# Patient Record
Sex: Female | Born: 1950 | Race: White | Hispanic: No | Smoking: Former smoker
Health system: Southern US, Community
[De-identification: ages and names within clinical notes are randomized; demographics above are authoritative.]

## PROBLEM LIST (undated history)

## (undated) DIAGNOSIS — F32A Depression, unspecified: Secondary | ICD-10-CM

## (undated) DIAGNOSIS — M549 Dorsalgia, unspecified: Secondary | ICD-10-CM

## (undated) DIAGNOSIS — I10 Essential (primary) hypertension: Secondary | ICD-10-CM

## (undated) DIAGNOSIS — E119 Type 2 diabetes mellitus without complications: Secondary | ICD-10-CM

## (undated) DIAGNOSIS — K219 Gastro-esophageal reflux disease without esophagitis: Secondary | ICD-10-CM

## (undated) DIAGNOSIS — F329 Major depressive disorder, single episode, unspecified: Secondary | ICD-10-CM

## (undated) DIAGNOSIS — F419 Anxiety disorder, unspecified: Secondary | ICD-10-CM

## (undated) DIAGNOSIS — G8929 Other chronic pain: Secondary | ICD-10-CM

## (undated) DIAGNOSIS — E785 Hyperlipidemia, unspecified: Secondary | ICD-10-CM

## (undated) DIAGNOSIS — M1990 Unspecified osteoarthritis, unspecified site: Secondary | ICD-10-CM

## (undated) DIAGNOSIS — I493 Ventricular premature depolarization: Secondary | ICD-10-CM

## (undated) DIAGNOSIS — R413 Other amnesia: Secondary | ICD-10-CM

## (undated) DIAGNOSIS — R55 Syncope and collapse: Secondary | ICD-10-CM

## (undated) HISTORY — DX: Dorsalgia, unspecified: M54.9

## (undated) HISTORY — DX: Anxiety disorder, unspecified: F41.9

## (undated) HISTORY — DX: Depression, unspecified: F32.A

## (undated) HISTORY — PX: ABDOMINAL HYSTERECTOMY: SHX81

## (undated) HISTORY — DX: Unspecified osteoarthritis, unspecified site: M19.90

## (undated) HISTORY — DX: Other chronic pain: G89.29

## (undated) HISTORY — PX: CARDIAC CATHETERIZATION: SHX172

## (undated) HISTORY — PX: DILATION AND CURETTAGE OF UTERUS: SHX78

## (undated) HISTORY — PX: EYE SURGERY: SHX253

## (undated) HISTORY — DX: Major depressive disorder, single episode, unspecified: F32.9

---

## 2014-02-23 ENCOUNTER — Emergency Department (HOSPITAL_COMMUNITY)
Admission: EM | Admit: 2014-02-23 | Discharge: 2014-02-23 | Payer: Medicare Other | Attending: Emergency Medicine | Admitting: Emergency Medicine

## 2014-02-23 ENCOUNTER — Encounter (HOSPITAL_COMMUNITY): Payer: Self-pay | Admitting: Emergency Medicine

## 2014-02-23 DIAGNOSIS — Z87891 Personal history of nicotine dependence: Secondary | ICD-10-CM | POA: Diagnosis not present

## 2014-02-23 DIAGNOSIS — Y9389 Activity, other specified: Secondary | ICD-10-CM | POA: Diagnosis not present

## 2014-02-23 DIAGNOSIS — T25329A Burn of third degree of unspecified foot, initial encounter: Secondary | ICD-10-CM | POA: Insufficient documentation

## 2014-02-23 DIAGNOSIS — E119 Type 2 diabetes mellitus without complications: Secondary | ICD-10-CM | POA: Insufficient documentation

## 2014-02-23 DIAGNOSIS — I10 Essential (primary) hypertension: Secondary | ICD-10-CM | POA: Diagnosis not present

## 2014-02-23 DIAGNOSIS — Z23 Encounter for immunization: Secondary | ICD-10-CM | POA: Insufficient documentation

## 2014-02-23 DIAGNOSIS — X028XXA Other exposure to controlled fire in building or structure, initial encounter: Secondary | ICD-10-CM | POA: Insufficient documentation

## 2014-02-23 DIAGNOSIS — T25029A Burn of unspecified degree of unspecified foot, initial encounter: Secondary | ICD-10-CM | POA: Insufficient documentation

## 2014-02-23 DIAGNOSIS — Y92009 Unspecified place in unspecified non-institutional (private) residence as the place of occurrence of the external cause: Secondary | ICD-10-CM | POA: Diagnosis not present

## 2014-02-23 DIAGNOSIS — T25321A Burn of third degree of right foot, initial encounter: Secondary | ICD-10-CM

## 2014-02-23 HISTORY — DX: Essential (primary) hypertension: I10

## 2014-02-23 HISTORY — DX: Type 2 diabetes mellitus without complications: E11.9

## 2014-02-23 MED ORDER — SILVER SULFADIAZINE 1 % EX CREA
TOPICAL_CREAM | Freq: Once | CUTANEOUS | Status: DC
  Filled 2014-02-23: qty 85

## 2014-02-23 MED ORDER — TETANUS-DIPHTH-ACELL PERTUSSIS 5-2.5-18.5 LF-MCG/0.5 IM SUSP
0.5000 mL | Freq: Once | INTRAMUSCULAR | Status: AC
  Administered 2014-02-23: 0.5 mL via INTRAMUSCULAR
  Filled 2014-02-23: qty 0.5

## 2014-02-23 NOTE — ED Notes (Signed)
Dr. Jacubowitz at bedside 
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
## 2014-02-24 NOTE — ED Provider Notes (Signed)
Medical screening examination/treatment/procedure(s) were conducted as a shared visit with non-physician practitioner(s) and myself.  I personally evaluated the patient during the encounter.   EKG Interpretation None       Orlie Dakin, MD 02/24/14 0110

## 2016-03-28 ENCOUNTER — Encounter: Payer: Self-pay | Admitting: Internal Medicine

## 2016-04-12 ENCOUNTER — Ambulatory Visit: Payer: Self-pay | Admitting: Nurse Practitioner

## 2016-04-25 ENCOUNTER — Ambulatory Visit (INDEPENDENT_AMBULATORY_CARE_PROVIDER_SITE_OTHER): Payer: Medicare HMO | Admitting: Nurse Practitioner

## 2016-04-25 ENCOUNTER — Telehealth: Payer: Self-pay

## 2016-04-25 ENCOUNTER — Other Ambulatory Visit: Payer: Self-pay

## 2016-04-25 ENCOUNTER — Encounter: Payer: Self-pay | Admitting: Nurse Practitioner

## 2016-04-25 DIAGNOSIS — Z800 Family history of malignant neoplasm of digestive organs: Secondary | ICD-10-CM

## 2016-04-25 DIAGNOSIS — R195 Other fecal abnormalities: Secondary | ICD-10-CM | POA: Diagnosis not present

## 2016-04-25 MED ORDER — PEG 3350-KCL-NA BICARB-NACL 420 G PO SOLR: 4000.0000 mL | ORAL | 0 refills | Status: DC

## 2016-04-25 NOTE — Assessment & Plan Note (Signed)
Primary care notes heme positive stool in this patient who is high risk for colon cancer due to family history as noted above. She has never had a colonoscopy before. At this point she has not noted overt GI bleeding. She does have a history of hemorrhoids. Likely benign anorectal source, but given family history and heme positive stool we will absolutely proceed with colonoscopy as a diagnostic evaluation. We will schedule her date tentatively as she is on Coumadin to reach out to her primary care provider to get the okay to hold Coumadin 4 days prior to procedure. Return for follow-up based on postprocedure recommendations. Early asymptomatic from a GI standpoint otherwise.  Proceed with TCS on propofol/'MAC with Dr. Gala Romney in near future: the risks, benefits, and alternatives have been discussed with the patient in detail. The patient states understanding and desires to proceed.  The patient is on Coumadin, Neurontin, oxycodone, and Ambien. Due to polypharmacy we will proceed with the endoscopic evaluation on propofol/MAC. We'll recheck out to primary care for okay to hold Coumadin 4 days prior.

## 2016-04-25 NOTE — Patient Instructions (Signed)
1. Reach out to your primary care provider to get the okay to hold your Coumadin prior to your procedure. 2. We will schedule your colonoscopy for you. 3. We will call to confirm the scheduled date after we get the okay from your primary care to hold Coumadin. 4. Further recommendations to be based on results of your procedure. 5. Return for follow-up based on recommendations made after your procedure.

## 2016-04-25 NOTE — Telephone Encounter (Signed)
Called pt and informed of pre-op appt 05/08/16 at 1:45pm. Letter also mailed.

## 2016-04-25 NOTE — Progress Notes (Signed)
Referring Provider: [facility] Primary Care Physician:  [facility] Primary GI:  Dr. Gala Romney  Chief Complaint  Patient presents with  . Colonoscopy    HPI:   Alexandra Berger is a 65 y.o. female who presents On referral from primary care for positive heme card and no history of colonoscopy. She is currently on Coumadin with a history of cardiac catheterization in 2014 with stenting. Most of her care is obtained in [place] and cardiology is likely up there as well. No history of colonoscopy found in our system.  Today she states she's doing well overall. Denies abdominal pain, N/V, hematochezia, and melena. Did have heme+ stool cards. Has never had a colonoscopy before. Denies fever, chills, unintentional weight loss, sudden changes in bowel habits. She has had hemorrhoids in the past. Denies chest pain, dyspnea, dizziness, lightheadedness, syncope, near syncope. Denies any other upper or lower GI symptoms.  Last saw cardiology 4 years ago. PCP manages coumadin now. Her cardiac cath in 2014 was clean. She is on coumadin due to family history of CAD and MI.  Takes her oxycodone "pretty regularly."  Past Medical History:  Diagnosis Date  . Anxiety and depression   . Chronic back pain   . Diabetes mellitus without complication ([place])   . DJD (degenerative joint disease)   . Hypertension     Past Surgical History:  Procedure Laterality Date  . ABDOMINAL HYSTERECTOMY    . CARDIAC CATHETERIZATION     2014  . EYE SURGERY Left     Current Outpatient Prescriptions  Medication Sig Dispense Refill  . benazepril (LOTENSIN) 20 MG tablet Take 20 mg by mouth.    . citalopram (CELEXA) 20 MG tablet Take 30 mg by mouth.    . cyclobenzaprine (FLEXERIL) 5 MG tablet Take 5 mg by mouth.    . digoxin (LANOXIN) 0.125 MG tablet Take by mouth.    . gabapentin (NEURONTIN) 300 MG capsule Take 300 mg by mouth.    . Insulin Glargine (LANTUS  SOLOSTAR) 100 UNIT/ML Solostar Pen Inject 48 Units into the skin daily at 10 pm.     . metFORMIN (GLUCOPHAGE) 1000 MG tablet Take 1,000 mg by mouth 2 (two) times daily with a meal.    . oxyCODONE-acetaminophen (PERCOCET/ROXICET) 5-325 MG per tablet Take 1 tablet by mouth every 4 (four) hours as needed for severe pain.    . pravastatin (PRAVACHOL) 20 MG tablet Take 20 mg by mouth.    . warfarin (COUMADIN) 5 MG tablet Take 5 mg by mouth daily. 6 mg Monday- Friday 7 mg  Saturday and Sunday    . zolpidem (AMBIEN) 5 MG tablet Take 2.5 mg by mouth.    . silver sulfADIAZINE (SILVADENE) 1 % cream Apply 1 application topically daily.     No current facility-administered medications for this visit.     Allergies as of 04/25/2016  . (No Known Allergies)    Family History  Problem Relation Age of Onset  . CAD Mother   . CAD Father   . Heart attack Father   . Colon cancer Brother 77    Social History   Social History  . Marital status: Married    Spouse name: N/A  . Number of children: N/A  . Years of education: N/A   Social History Main Topics  . Smoking status: Former Smoker    Quit date: 02/24/2012  . Smokeless tobacco: Never Used  . Alcohol use No  .  Drug use: No  . Sexual activity: Not Asked   Other Topics Concern  . None   Social History Narrative  . None    Review of Systems: General: Negative for anorexia, weight loss, fever, chills, fatigue, weakness. ENT: Negative for hoarseness, difficulty swallowing. CV: Negative for chest pain, angina, palpitations, peripheral edema.  Respiratory: Negative for dyspnea at rest, cough, sputum, wheezing.  GI: See history of present illness. MS: Admits DJD with chronic pain.  Derm: Negative for rash or itching.  Endo: Negative for unusual weight change.  Heme: Negative for bruising or bleeding. Allergy: Negative for rash or hives.   Physical Exam: BP 137/81   Pulse 76   Temp 97.6 F (36.4 C) (Oral)   Ht '5\' 5"'$  (1.651 m)   Wt  226 lb 12.8 oz (102.9 kg)   BMI 37.74 kg/m  General:   Obese female. Alert and oriented. Pleasant and cooperative. Well-nourished and well-developed.  Head:  Normocephalic and atraumatic. Eyes:  Without icterus, sclera clear and conjunctiva pink.  Ears:  Normal auditory acuity. Cardiovascular:  S1, S2 present without murmurs appreciated. Extremities without clubbing or edema. Respiratory:  Clear to auscultation bilaterally. No wheezes, rales, or rhonchi. No distress.  Gastrointestinal:  +BS, obese but soft, non-tender and non-distended. No HSM noted. No guarding or rebound. No masses appreciated.  Rectal:  Deferred  Musculoskalatal:  Symmetrical without gross deformities. Neurologic:  Alert and oriented x4;  grossly normal neurologically. Psych:  Alert and cooperative. Normal mood and affect. Heme/Lymph/Immune: No excessive bruising noted.    04/25/2016 1:46 PM   Disclaimer: This note was dictated with voice recognition software. Similar sounding words can inadvertently be transcribed and may not be corrected upon review.

## 2016-04-25 NOTE — Assessment & Plan Note (Signed)
High risk of colon cancer due to family history of colon cancer. Her brother had colorectal cancer at age 65-50. He passed away at age 12. The patient is age 41 and is never had a colonoscopy before. She is significantly overdue and concerning due to heme positive stool. We will proceed with colonoscopy as noted below.

## 2016-04-26 ENCOUNTER — Telehealth: Payer: Self-pay

## 2016-04-26 NOTE — Progress Notes (Signed)
CC'ED TO PCP 

## 2016-04-26 NOTE — Telephone Encounter (Signed)
Kim from [facility] called again, they have pt scheduled to see Dr.Hunter on 05/02/16 at 9:45am to discuss her coumadin. They have informed the pt.   pts procedure with RMR is on 05/14/16.

## 2016-04-26 NOTE — Telephone Encounter (Signed)
Noted. Let me know when we get info back so we can wrap up scheduling.  Thanks

## 2016-04-26 NOTE — Telephone Encounter (Signed)
Kim from [facility] called- Dr. Yong Channel needs the pt to come in for an office visit before she will approve holding her coumadin. Kim said their office will call and inform the pt and make her an appt with them and then they will let us know if it can be held.

## 2016-05-02 ENCOUNTER — Telehealth: Payer: Self-pay

## 2016-05-02 NOTE — Telephone Encounter (Signed)
Pt called and said she went to her PCP who didn't want to ok pt holding Coumadin for TCS. PCP wants her to see her Cardiologist first. TCS cancelled at this time per pt request. She will call later if TCS can be rescheduled. Pt is aware she will need another office visit if >30 days after last office visit.

## 2016-05-08 ENCOUNTER — Other Ambulatory Visit (HOSPITAL_COMMUNITY): Payer: Medicare HMO

## 2016-05-14 ENCOUNTER — Encounter (HOSPITAL_COMMUNITY): Payer: Self-pay

## 2016-05-14 ENCOUNTER — Ambulatory Visit (HOSPITAL_COMMUNITY): Admit: 2016-05-14 | Payer: Medicare HMO | Admitting: Internal Medicine

## 2016-05-14 SURGERY — COLONOSCOPY WITH PROPOFOL
Anesthesia: Monitor Anesthesia Care

## 2016-12-11 ENCOUNTER — Encounter: Payer: Self-pay | Admitting: Gastroenterology

## 2017-01-02 DIAGNOSIS — E118 Type 2 diabetes mellitus with unspecified complications: Secondary | ICD-10-CM

## 2017-02-04 ENCOUNTER — Ambulatory Visit: Payer: Medicare HMO | Admitting: Gastroenterology

## 2017-04-30 ENCOUNTER — Ambulatory Visit: Payer: Medicare HMO | Admitting: Nurse Practitioner

## 2017-05-08 ENCOUNTER — Other Ambulatory Visit (HOSPITAL_COMMUNITY): Payer: Self-pay | Admitting: Internal Medicine

## 2017-05-08 DIAGNOSIS — R928 Other abnormal and inconclusive findings on diagnostic imaging of breast: Secondary | ICD-10-CM

## 2017-05-14 ENCOUNTER — Encounter (HOSPITAL_COMMUNITY): Payer: Self-pay

## 2017-05-14 ENCOUNTER — Encounter (HOSPITAL_COMMUNITY): Payer: Medicare HMO

## 2017-06-11 ENCOUNTER — Ambulatory Visit: Payer: Medicare HMO | Admitting: Gastroenterology

## 2017-11-28 ENCOUNTER — Other Ambulatory Visit (HOSPITAL_COMMUNITY): Payer: Self-pay | Admitting: Internal Medicine

## 2017-11-28 DIAGNOSIS — R928 Other abnormal and inconclusive findings on diagnostic imaging of breast: Secondary | ICD-10-CM

## 2017-12-10 ENCOUNTER — Ambulatory Visit (HOSPITAL_COMMUNITY)
Admission: RE | Admit: 2017-12-10 | Discharge: 2017-12-10 | Disposition: A | Discharge status: Home / Self Care | Payer: Medicare HMO | Source: Ambulatory Visit | Attending: Internal Medicine | Admitting: Internal Medicine

## 2017-12-10 DIAGNOSIS — R928 Other abnormal and inconclusive findings on diagnostic imaging of breast: Secondary | ICD-10-CM | POA: Diagnosis not present

## 2017-12-27 ENCOUNTER — Encounter: Payer: Self-pay | Admitting: Gastroenterology

## 2018-04-07 ENCOUNTER — Ambulatory Visit: Payer: Medicare HMO | Admitting: Gastroenterology

## 2018-07-15 ENCOUNTER — Ambulatory Visit: Payer: Medicare HMO | Admitting: Gastroenterology

## 2018-07-15 ENCOUNTER — Other Ambulatory Visit: Payer: Self-pay

## 2018-07-15 ENCOUNTER — Encounter: Payer: Self-pay | Admitting: Gastroenterology

## 2018-07-15 VITALS — BP 121/73 | HR 95 | Temp 97.0°F | Ht 65.0 in | Wt 213.0 lb

## 2018-07-15 DIAGNOSIS — Z800 Family history of malignant neoplasm of digestive organs: Secondary | ICD-10-CM

## 2018-07-15 MED ORDER — PEG 3350-KCL-NA BICARB-NACL 420 G PO SOLR: 4000.0000 mL | ORAL | 0 refills | Status: DC

## 2018-07-15 NOTE — Patient Instructions (Addendum)
We have arranged a colonoscopy with Dr. Gala Romney in the near future.  Please take 1/2 doses of your insulin shots the day before and no insulin shot the morning of the procedure.   Please stop Coumadin 3 days prior to the procedure.   Further recommendations to follow!  It was a pleasure to see you today. I strive to create trusting relationships with patients to provide genuine, compassionate, and quality care. I value your feedback. If you receive a survey regarding your visit,  I greatly appreciate you taking time to fill this out.   Annitta Needs, PhD, ANP-BC [facility]

## 2018-07-15 NOTE — Progress Notes (Signed)
Primary Care Physician:  [facility] Primary Gastroenterologist:  Dr. Gala Romney   Chief Complaint  Patient presents with  . Consult    TCS. never had 1 prior. Brother had colon cancer    HPI:   Alexandra Berger is a 68 y.o. female presenting today at the request of [name] for initial screening colonoscopy. Family history notable for brother with history of colon cancer at age 57 and passed away at age 98 due to disease. She was seen here in 2017 but unable to pursue colonoscopy at that time.   She is on low dose Coumadin, 1 mg daily, managed by PCP. History of cardiac cath but no stent. Has no concerning lower GI or upper GI signs or symptoms. No rectal bleeding, changes in bowel habits, dysphagia, abdominal pain, unexplained weight loss, lack of appetite. Her son, Alexandra Berger, age 23, is present today with her. He has Down Syndrome, and she enjoyed telling his birth story and family history through the years. Her daughter will be the support person for the colonoscopy and provide transportation.   Past Medical History:  Diagnosis Date  . Anxiety and depression   . Chronic back pain   . Diabetes mellitus without complication ([place])   . DJD (degenerative joint disease)   . Hypertension     Past Surgical History:  Procedure Laterality Date  . ABDOMINAL HYSTERECTOMY    . CARDIAC CATHETERIZATION     2014  . DILATION AND CURETTAGE OF UTERUS    . EYE SURGERY Left    X 2    Current Outpatient Medications  Medication Sig Dispense Refill  . atorvastatin (LIPITOR) 20 MG tablet Take 20 mg by mouth daily.    . benazepril (LOTENSIN) 20 MG tablet Take 20 mg by mouth.    . citalopram (CELEXA) 20 MG tablet Take 40 mg by mouth daily.     . cyclobenzaprine (FLEXERIL) 5 MG tablet Take 5 mg by mouth as needed.     . cycloSPORINE (RESTASIS) 0.05 % ophthalmic emulsion 1 drop 2 (two) times daily.    . digoxin (LANOXIN) 0.125 MG tablet Take by mouth.     . ergocalciferol (VITAMIN D2) 1.25 MG (50000 UT) capsule Take 50,000 Units by mouth every 30 (thirty) days.    . famotidine (PEPCID) 20 MG tablet Take 20 mg by mouth at bedtime.    . Flax Oil-Fish Oil-Borage Oil (FISH OIL-FLAX OIL-BORAGE OIL PO) Take 1 tablet by mouth daily.    Marland Kitchen gabapentin (NEURONTIN) 300 MG capsule Take 300 mg by mouth 3 (three) times daily.     . insulin detemir (LEVEMIR) 100 UNIT/ML injection Inject into the skin daily. 48 units in am and 24 units in PM    . Insulin Glargine (LANTUS SOLOSTAR) 100 UNIT/ML Solostar Pen Inject 48 Units into the skin daily at 10 pm.     . omeprazole (PRILOSEC) 20 MG capsule Take 20 mg by mouth daily.    Marland Kitchen oxyCODONE-acetaminophen (PERCOCET/ROXICET) 5-325 MG per tablet Take 1 tablet by mouth every 4 (four) hours as needed for severe pain.    . pravastatin (PRAVACHOL) 20 MG tablet Take 20 mg by mouth.    . silver sulfADIAZINE (SILVADENE) 1 % cream Apply 1 application topically daily.    Marland Kitchen warfarin (COUMADIN) 1 MG tablet Take 1 mg by mouth daily.    . polyethylene glycol-electrolytes (TRILYTE) 420 g solution Take 4,000 mLs by mouth as directed. 4000 mL 0  No current facility-administered medications for this visit.     Allergies as of 07/15/2018 - Review Complete 07/15/2018  Allergen Reaction Noted  . Statins  07/15/2018    Family History  Problem Relation Age of Onset  . CAD Mother   . CAD Father   . Heart attack Father   . Colon cancer Brother 16    Social History   Socioeconomic History  . Marital status: Widowed    Spouse name: Not on file  . Number of children: Not on file  . Years of education: Not on file  . Highest education level: Not on file  Occupational History  . Occupation: retired    Comment: Diplomatic Services operational officer  . Financial resource strain: Not on file  . Food insecurity:    Worry: Not on file    Inability: Not on file  . Transportation needs:    Medical: Not on file    Non-medical: Not on file  Tobacco  Use  . Smoking status: Former Smoker    Last attempt to quit: 02/23/2002    Years since quitting: 16.4  . Smokeless tobacco: Never Used  Substance and Sexual Activity  . Alcohol use: No  . Drug use: No  . Sexual activity: Not on file  Lifestyle  . Physical activity:    Days per week: Not on file    Minutes per session: Not on file  . Stress: Not on file  Relationships  . Social connections:    Talks on phone: Not on file    Gets together: Not on file    Attends religious service: Not on file    Active member of club or organization: Not on file    Attends meetings of clubs or organizations: Not on file    Relationship status: Not on file  . Intimate partner violence:    Fear of current or ex partner: Not on file    Emotionally abused: Not on file    Physically abused: Not on file    Forced sexual activity: Not on file  Other Topics Concern  . Not on file  Social History Narrative  . Not on file    Review of Systems: Gen: Denies any fever, chills, fatigue, weight loss, lack of appetite.  CV: Denies chest pain, heart palpitations, peripheral edema, syncope.  Resp: Denies shortness of breath at rest or with exertion. Denies wheezing or cough.  GI: see HPI  GU : Denies urinary burning, urinary frequency, urinary hesitancy MS: Denies joint pain, muscle weakness, cramps, or limitation of movement.  Derm: Denies rash, itching, dry skin Psych: Denies depression, anxiety, memory loss, and confusion Heme: Denies bruising, bleeding, and enlarged lymph nodes.  Physical Exam: BP 121/73   Pulse 95   Temp (!) 97 F (36.1 C) (Oral)   Ht 5\' 5"  (1.651 m)   Wt 213 lb (96.6 kg)   BMI 35.45 kg/m  General:   Alert and oriented. Pleasant and cooperative. Well-nourished and well-developed.  Head:  Normocephalic and atraumatic. Eyes:  Without icterus, sclera clear and conjunctiva pink.  Ears:  Normal auditory acuity. Nose:  No deformity, discharge,  or lesions. Mouth:  No deformity or  lesions, oral mucosa pink.  Lungs:  Clear to auscultation bilaterally. No wheezes, rales, or rhonchi. No distress.  Heart:  S1, S2 present without murmurs appreciated.  Abdomen:  +BS, soft, non-tender and non-distended. No HSM noted. No guarding or rebound. No masses appreciated.  Rectal:  Deferred  Msk:  Symmetrical without gross deformities. Normal posture. Extremities:  Without edema. Neurologic:  Alert and  oriented x4 Psych:  Alert and cooperative. Normal mood and affect.

## 2018-07-15 NOTE — Progress Notes (Signed)
cc'ed to pcp °

## 2018-07-15 NOTE — Assessment & Plan Note (Signed)
68 year old female due for initial screening for colonoscopy, high risk with brother diagnosed at age 80 and passing away at age 60. No concerning lower or upper GI signs/symptoms. She is on low dose Coumadin 1 mg chronically. We will have her hold this 3 days prior and update PCP about this as well. No need for bridging.   Proceed with TCS with Dr. Gala Romney in near future: the risks, benefits, and alternatives have been discussed with the patient in detail. The patient states understanding and desires to proceed. Propofol due to polypharmacy Hold Coumadin 3 days prior Half dosage of insulin day before, no insulin day of

## 2018-07-16 ENCOUNTER — Telehealth: Payer: Self-pay

## 2018-07-16 NOTE — Telephone Encounter (Signed)
Called and informed pt of pre-op appt 08/14/18 at 12:45pm. Letter mailed.

## 2018-08-05 ENCOUNTER — Telehealth: Payer: Self-pay

## 2018-08-05 NOTE — Telephone Encounter (Signed)
I have faxed request to [facility] twice, 07/17/2018 and 07/31/2018 asking about HOLDING coumadin prior to procedure.  I called this morning and spoke to [place] who will speak with the nurse and have her fax the info to [place].

## 2018-08-06 NOTE — Telephone Encounter (Signed)
I received a fax from [name] to hold coumadin for 3 days prior to procedure. Placed on Alexandra Berger's desk for signing and scanning.

## 2018-08-06 NOTE — Telephone Encounter (Signed)
Noted  

## 2018-08-06 NOTE — Telephone Encounter (Signed)
Spoke with patient and she is aware we received the okay for her to hold coumadin 3 days prior to procedure starting 08/18/2018. She voiced understanding

## 2018-08-07 ENCOUNTER — Other Ambulatory Visit (HOSPITAL_COMMUNITY): Payer: Medicare HMO

## 2018-08-14 ENCOUNTER — Encounter (HOSPITAL_COMMUNITY)
Admission: RE | Admit: 2018-08-14 | Discharge: 2018-08-14 | Disposition: A | Discharge status: Home / Self Care | Payer: Medicare HMO | Source: Ambulatory Visit | Attending: Internal Medicine | Admitting: Internal Medicine

## 2018-08-14 ENCOUNTER — Other Ambulatory Visit: Payer: Self-pay

## 2018-08-14 ENCOUNTER — Encounter (HOSPITAL_COMMUNITY): Payer: Self-pay

## 2018-08-14 DIAGNOSIS — Z01812 Encounter for preprocedural laboratory examination: Secondary | ICD-10-CM | POA: Diagnosis not present

## 2018-08-14 LAB — BASIC METABOLIC PANEL
ANION GAP: 9 (ref 5–15)
BUN: 21 mg/dL (ref 8–23)
CO2: 26 mmol/L (ref 22–32)
Calcium: 9 mg/dL (ref 8.9–10.3)
Chloride: 103 mmol/L (ref 98–111)
Creatinine, Ser: 1.05 mg/dL — ABNORMAL HIGH (ref 0.44–1.00)
GFR calc Af Amer: >60 mL/min (ref >60)
GFR calc non Af Amer: 55 mL/min — ABNORMAL LOW (ref >60)
GLUCOSE: 200 mg/dL — AB (ref 70–99)
Potassium: 4.7 mmol/L (ref 3.5–5.1)
Sodium: 138 mmol/L (ref 135–145)

## 2018-08-18 ENCOUNTER — Telehealth: Payer: Self-pay | Admitting: Internal Medicine

## 2018-08-18 NOTE — Telephone Encounter (Signed)
PATIENT CALLED AND IS SICK AND NEEDS TO RESCHEDULE HER PROCEDURE

## 2018-08-18 NOTE — Telephone Encounter (Signed)
Spoke with patient and she is r/s'd to 10/13/2018 at 12:45pm. New instructions mailed. Called endo and LMOVM for carolyn to make aware.

## 2018-10-02 ENCOUNTER — Telehealth: Payer: Self-pay | Admitting: *Deleted

## 2018-10-02 NOTE — Telephone Encounter (Signed)
Spoke with patient and she has r/s'd procedure to 6/4 at 12:45pm. Patient aware new instructions/pre-op will be mailed.

## 2018-11-12 ENCOUNTER — Telehealth: Payer: Self-pay | Admitting: *Deleted

## 2018-11-12 NOTE — Telephone Encounter (Signed)
Called patient and she is aware will cancel procedure for now and we will call her back at a later date to r/s d/t COVID-19.

## 2018-11-20 ENCOUNTER — Telehealth: Payer: Self-pay | Admitting: *Deleted

## 2018-11-20 ENCOUNTER — Other Ambulatory Visit: Payer: Self-pay | Admitting: *Deleted

## 2018-11-20 DIAGNOSIS — Z800 Family history of malignant neoplasm of digestive organs: Secondary | ICD-10-CM

## 2018-11-20 NOTE — Telephone Encounter (Signed)
Called patient and TCS with propofol with RMR is scheduled for 02/05/2019 at 7:30am. Patient aware will mail new instructions with her pre-op appt (confirmed address). Patient also aware she will need to hold coumadin 3 days prior and diabetic adjustments will be on instructions. Orders entered.

## 2018-11-21 ENCOUNTER — Other Ambulatory Visit (HOSPITAL_COMMUNITY): Payer: Medicare HMO

## 2018-11-27 ENCOUNTER — Ambulatory Visit (HOSPITAL_COMMUNITY): Admission: RE | Admit: 2018-11-27 | Payer: Medicare HMO | Source: Home / Self Care | Admitting: Internal Medicine

## 2018-11-27 ENCOUNTER — Encounter (HOSPITAL_COMMUNITY): Admission: RE | Payer: Self-pay | Source: Home / Self Care

## 2018-11-27 SURGERY — COLONOSCOPY WITH PROPOFOL
Anesthesia: Monitor Anesthesia Care

## 2019-01-28 NOTE — Patient Instructions (Signed)
Alexandra Berger  01/28/2019     @PREFPERIOPPHARMACY @   Your procedure is scheduled on  02/05/2019.  Report to [facility] at  615  A.M.  Call this number if you have problems the morning of surgery:  [phone]   Remember:  Follow the diet and prep instructions given to you by Dr Roseanne Kaufman office.                        Take these medicines the morning of surgery with A SIP OF WATER  Celexa, flexaril, digoxin, pepcid, gabapentin, prilosec. Take 1/2 of your usual night time insulin the night before your procedure. DO NOT take any medication for diabetes the morning of your procedure.    Do not wear jewelry, make-up or nail polish.  Do not wear lotions, powders, or perfumes. Please wear deodorant and brush your teeth.  Do not shave 48 hours prior to surgery.  Men may shave face and neck.  Do not bring valuables to the hospital.  [facility] is not responsible for any belongings or valuables.  Contacts, dentures or bridgework may not be worn into surgery.  Leave your suitcase in the car.  After surgery it may be brought to your room.  For patients admitted to the hospital, discharge time will be determined by your treatment team.  Patients discharged the day of surgery will not be allowed to drive home.   Name and phone number of your driver:   family Special instructions:  None  Please read over the following fact sheets that you were given. Anesthesia Post-op Instructions and Care and Recovery After Surgery       Colonoscopy, Adult, Care After This sheet gives you information about how to care for yourself after your procedure. Your health care provider may also give you more specific instructions. If you have problems or questions, contact your health care provider. What can I expect after the procedure? After the procedure, it is common to have:  A small amount of blood in your stool for 24 hours after the procedure.  Some gas.  Mild abdominal cramping or  bloating. Follow these instructions at home: General instructions  For the first 24 hours after the procedure: ? Do not drive or use machinery. ? Do not sign important documents. ? Do not drink alcohol. ? Do your regular daily activities at a slower pace than normal. ? Eat soft, easy-to-digest foods.  Take over-the-counter or prescription medicines only as told by your health care provider. Relieving cramping and bloating   Try walking around when you have cramps or feel bloated.  Apply heat to your abdomen as told by your health care provider. Use a heat source that your health care provider recommends, such as a moist heat pack or a heating pad. ? Place a towel between your skin and the heat source. ? Leave the heat on for 20-30 minutes. ? Remove the heat if your skin turns bright red. This is especially important if you are unable to feel pain, heat, or cold. You may have a greater risk of getting burned. Eating and drinking   Drink enough fluid to keep your urine pale yellow.  Resume your normal diet as instructed by your health care provider. Avoid heavy or fried foods that are hard to digest.  Avoid drinking alcohol for as long as instructed by your health care provider. Contact a health care provider if:  You have blood  in your stool 2-3 days after the procedure. Get help right away if:  You have more than a small spotting of blood in your stool.  You pass large blood clots in your stool.  Your abdomen is swollen.  You have nausea or vomiting.  You have a fever.  You have increasing abdominal pain that is not relieved with medicine. Summary  After the procedure, it is common to have a small amount of blood in your stool. You may also have mild abdominal cramping and bloating.  For the first 24 hours after the procedure, do not drive or use machinery, sign important documents, or drink alcohol.  Contact your health care provider if you have a lot of blood in  your stool, nausea or vomiting, a fever, or increased abdominal pain. This information is not intended to replace advice given to you by your health care provider. Make sure you discuss any questions you have with your health care provider. Document Released: 01/24/2004 Document Revised: 04/03/2017 Document Reviewed: 08/23/2015 Elsevier Patient Education  2020 [place] After These instructions provide you with information about caring for yourself after your procedure. Your health care provider may also give you more specific instructions. Your treatment has been planned according to current medical practices, but problems sometimes occur. Call your health care provider if you have any problems or questions after your procedure. What can I expect after the procedure? After your procedure, you may:  Feel sleepy for several hours.  Feel clumsy and have poor balance for several hours.  Feel forgetful about what happened after the procedure.  Have poor judgment for several hours.  Feel nauseous or vomit.  Have a sore throat if you had a breathing tube during the procedure. Follow these instructions at home: For at least 24 hours after the procedure:      Have a responsible adult stay with you. It is important to have someone help care for you until you are awake and alert.  Rest as needed.  Do not: ? Participate in activities in which you could fall or become injured. ? Drive. ? Use heavy machinery. ? Drink alcohol. ? Take sleeping pills or medicines that cause drowsiness. ? Make important decisions or sign legal documents. ? Take care of children on your own. Eating and drinking  Follow the diet that is recommended by your health care provider.  If you vomit, drink water, juice, or soup when you can drink without vomiting.  Make sure you have little or no nausea before eating solid foods. General instructions  Take over-the-counter and  prescription medicines only as told by your health care provider.  If you have sleep apnea, surgery and certain medicines can increase your risk for breathing problems. Follow instructions from your health care provider about wearing your sleep device: ? Anytime you are sleeping, including during daytime naps. ? While taking prescription pain medicines, sleeping medicines, or medicines that make you drowsy.  If you smoke, do not smoke without supervision.  Keep all follow-up visits as told by your health care provider. This is important. Contact a health care provider if:  You keep feeling nauseous or you keep vomiting.  You feel light-headed.  You develop a rash.  You have a fever. Get help right away if:  You have trouble breathing. Summary  For several hours after your procedure, you may feel sleepy and have poor judgment.  Have a responsible adult stay with you for at least 24  hours or until you are awake and alert. This information is not intended to replace advice given to you by your health care provider. Make sure you discuss any questions you have with your health care provider. Document Released: 10/02/2015 Document Revised: 09/09/2017 Document Reviewed: 10/02/2015 Elsevier Patient Education  2020 [employer].

## 2019-01-29 ENCOUNTER — Telehealth: Payer: Self-pay | Admitting: *Deleted

## 2019-01-29 NOTE — Telephone Encounter (Signed)
Patient called. She states she needs to cancel procedure scheduled for next week. She came in contact with granddaughter that was exposed to COVID-19 and they are still awaiting the test results. She was advised she needed to quarantine for 14 days. I called endo and made aware to cancel. FYI to AB

## 2019-01-29 NOTE — Telephone Encounter (Signed)
Received a call from [place] in endo. Patient cancelled pre-op appt via automated system. She tried calling patient and had to [place].  I called patient and LMOVM.

## 2019-02-02 ENCOUNTER — Encounter (HOSPITAL_COMMUNITY)
Admission: RE | Admit: 2019-02-02 | Discharge: 2019-02-02 | Disposition: A | Discharge status: Home / Self Care | Payer: Medicare HMO | Source: Ambulatory Visit | Attending: Internal Medicine | Admitting: Internal Medicine

## 2019-02-02 ENCOUNTER — Encounter (HOSPITAL_COMMUNITY): Payer: Self-pay

## 2019-02-05 ENCOUNTER — Encounter (HOSPITAL_COMMUNITY): Payer: Self-pay

## 2019-02-05 ENCOUNTER — Ambulatory Visit (HOSPITAL_COMMUNITY): Admit: 2019-02-05 | Payer: Medicare HMO | Admitting: Internal Medicine

## 2019-02-05 SURGERY — COLONOSCOPY WITH PROPOFOL
Anesthesia: Monitor Anesthesia Care

## 2019-12-31 ENCOUNTER — Encounter: Payer: Self-pay | Admitting: Internal Medicine

## 2020-02-24 ENCOUNTER — Ambulatory Visit: Payer: Medicare HMO | Admitting: Gastroenterology

## 2020-04-06 ENCOUNTER — Ambulatory Visit: Payer: Medicare HMO | Admitting: Gastroenterology

## 2020-05-18 ENCOUNTER — Ambulatory Visit: Payer: Medicare HMO | Admitting: Gastroenterology

## 2021-03-10 ENCOUNTER — Other Ambulatory Visit: Payer: Self-pay | Admitting: Internal Medicine

## 2021-03-10 DIAGNOSIS — Z1231 Encounter for screening mammogram for malignant neoplasm of breast: Secondary | ICD-10-CM

## 2021-03-22 DIAGNOSIS — I951 Orthostatic hypotension: Secondary | ICD-10-CM | POA: Diagnosis not present

## 2021-03-22 DIAGNOSIS — R42 Dizziness and giddiness: Secondary | ICD-10-CM | POA: Diagnosis not present

## 2021-03-22 DIAGNOSIS — Z6833 Body mass index (BMI) 33.0-33.9, adult: Secondary | ICD-10-CM | POA: Diagnosis not present

## 2021-03-22 DIAGNOSIS — R69 Illness, unspecified: Secondary | ICD-10-CM | POA: Diagnosis not present

## 2021-03-22 DIAGNOSIS — K2100 Gastro-esophageal reflux disease with esophagitis, without bleeding: Secondary | ICD-10-CM | POA: Diagnosis not present

## 2021-03-22 DIAGNOSIS — E1169 Type 2 diabetes mellitus with other specified complication: Secondary | ICD-10-CM | POA: Diagnosis not present

## 2021-03-22 DIAGNOSIS — G3184 Mild cognitive impairment, so stated: Secondary | ICD-10-CM | POA: Diagnosis not present

## 2021-03-22 DIAGNOSIS — E7800 Pure hypercholesterolemia, unspecified: Secondary | ICD-10-CM | POA: Diagnosis not present

## 2021-03-22 DIAGNOSIS — I10 Essential (primary) hypertension: Secondary | ICD-10-CM | POA: Diagnosis not present

## 2021-04-05 DIAGNOSIS — Z6834 Body mass index (BMI) 34.0-34.9, adult: Secondary | ICD-10-CM | POA: Diagnosis not present

## 2021-04-05 DIAGNOSIS — Z23 Encounter for immunization: Secondary | ICD-10-CM | POA: Diagnosis not present

## 2021-04-05 DIAGNOSIS — E1122 Type 2 diabetes mellitus with diabetic chronic kidney disease: Secondary | ICD-10-CM | POA: Diagnosis not present

## 2021-04-05 DIAGNOSIS — I10 Essential (primary) hypertension: Secondary | ICD-10-CM | POA: Diagnosis not present

## 2021-04-05 DIAGNOSIS — Z0000 Encounter for general adult medical examination without abnormal findings: Secondary | ICD-10-CM | POA: Diagnosis not present

## 2021-04-05 DIAGNOSIS — N1831 Chronic kidney disease, stage 3a: Secondary | ICD-10-CM | POA: Diagnosis not present

## 2021-04-05 DIAGNOSIS — G3184 Mild cognitive impairment, so stated: Secondary | ICD-10-CM | POA: Diagnosis not present

## 2021-04-05 DIAGNOSIS — R69 Illness, unspecified: Secondary | ICD-10-CM | POA: Diagnosis not present

## 2021-04-05 DIAGNOSIS — Z9181 History of falling: Secondary | ICD-10-CM | POA: Diagnosis not present

## 2021-05-23 ENCOUNTER — Encounter: Payer: Self-pay | Admitting: Radiology

## 2021-05-23 ENCOUNTER — Encounter: Payer: Self-pay | Admitting: Cardiovascular Disease

## 2021-05-23 ENCOUNTER — Other Ambulatory Visit: Payer: Self-pay

## 2021-05-23 ENCOUNTER — Ambulatory Visit: Payer: Medicare HMO | Admitting: Cardiovascular Disease

## 2021-05-23 DIAGNOSIS — E785 Hyperlipidemia, unspecified: Secondary | ICD-10-CM | POA: Insufficient documentation

## 2021-05-23 DIAGNOSIS — Z8249 Family history of ischemic heart disease and other diseases of the circulatory system: Secondary | ICD-10-CM | POA: Diagnosis not present

## 2021-05-23 DIAGNOSIS — I951 Orthostatic hypotension: Secondary | ICD-10-CM

## 2021-05-23 DIAGNOSIS — E782 Mixed hyperlipidemia: Secondary | ICD-10-CM | POA: Diagnosis not present

## 2021-05-23 DIAGNOSIS — R55 Syncope and collapse: Secondary | ICD-10-CM | POA: Diagnosis not present

## 2021-05-23 DIAGNOSIS — I10 Essential (primary) hypertension: Secondary | ICD-10-CM | POA: Diagnosis not present

## 2021-05-23 NOTE — Assessment & Plan Note (Signed)
2 episodes of syncope 5 to 6 months ago without warning.  No episodes since.  She does not drive.  I am going to get a 30-day Zio patch to further evaluate.

## 2021-05-23 NOTE — Progress Notes (Signed)
Enrolled patient for a 30 day [employer] Event Monitor to be mailed to patients home  

## 2021-05-23 NOTE — Patient Instructions (Signed)
Medication Instructions:   -Stop taking digoxin (lanoxin).   *If you need a refill on your cardiac medications before your next appointment, please call your pharmacy*   Testing/Procedures: Dr. [name] has ordered a CT coronary calcium score. This test is done at [address]. This is $99 out of pocket.   Coronary CalciumScan A coronary calcium scan is an imaging test used to look for deposits of calcium and other fatty materials (plaques) in the inner lining of the blood vessels of the heart (coronary arteries). These deposits of calcium and plaques can partly clog and narrow the coronary arteries without producing any symptoms or warning signs. This puts a person at risk for a heart attack. This test can detect these deposits before symptoms develop. Tell a health care provider about: Any allergies you have. All medicines you are taking, including vitamins, herbs, eye drops, creams, and over-the-counter medicines. Any problems you or family members have had with anesthetic medicines. Any blood disorders you have. Any surgeries you have had. Any medical conditions you have. Whether you are pregnant or may be pregnant. What are the risks? Generally, this is a safe procedure. However, problems may occur, including: Harm to a pregnant woman and her unborn baby. This test involves the use of radiation. Radiation exposure can be dangerous to a pregnant woman and her unborn baby. If you are pregnant, you generally should not have this procedure done. Slight increase in the risk of cancer. This is because of the radiation involved in the test. What happens before the procedure? No preparation is needed for this procedure. What happens during the procedure? You will undress and remove any jewelry around your neck or chest. You will put on a hospital gown. Sticky electrodes will be placed on your chest. The electrodes will be connected to an electrocardiogram (ECG) machine to  record a tracing of the electrical activity of your heart. A CT scanner will take pictures of your heart. During this time, you will be asked to lie still and hold your breath for 2-3 seconds while a picture of your heart is being taken. The procedure may vary among health care providers and hospitals. What happens after the procedure? You can get dressed. You can return to your normal activities. It is up to you to get the results of your test. Ask your health care provider, or the department that is doing the test, when your results will be ready. Summary A coronary calcium scan is an imaging test used to look for deposits of calcium and other fatty materials (plaques) in the inner lining of the blood vessels of the heart (coronary arteries). Generally, this is a safe procedure. Tell your health care provider if you are pregnant or may be pregnant. No preparation is needed for this procedure. A CT scanner will take pictures of your heart. You can return to your normal activities after the scan is done. This information is not intended to replace advice given to you by your health care provider. Make sure you discuss any questions you have with your health care provider. Document Released: 12/08/2007 Document Revised: 04/30/2016 Document Reviewed: 04/30/2016 Elsevier Interactive Patient Education  2017 [place] Cardiac Event Monitor Instructions Your physician has requested you wear your cardiac event monitor for 30 days. [employer] may call or text to confirm a shipping address. The monitor will be sent to a land address via UPS. [employer] will not ship a monitor to a PO BOX.  It typically takes 3-5 days to receive your monitor after it has been enrolled. [employer] will assist with USPS tracking if your package is delayed. The telephone number for [employer] is [phone]. Once you have received your monitor, please review the enclosed instructions. Instruction  tutorials can also be viewed under help and settings on the enclosed cell phone. Your monitor has already been registered assigning a specific monitor serial # to you.  Applying the monitor Remove cell phone from case and turn it on. The cell phone works as Dealer and needs to be within [employer] of you at all times. The cell phone will need to be charged on a daily basis. We recommend you plug the cell phone into the enclosed charger at your bedside table every night.  Monitor batteries: You will receive two monitor batteries labelled #1 and #2. These are your recorders. Plug battery #2 onto the second connection on the enclosed charger. Keep one battery on the charger at all times. This will keep the monitor battery deactivated. It will also keep it fully charged for when you need to switch your monitor batteries. A small light will be blinking on the battery emblem when it is charging. The light on the battery emblem will remain on when the battery is fully charged.  Open package of a Monitor strip. Insert battery #1 into black hood on strip and gently squeeze monitor battery onto connection as indicated in instruction booklet. Set aside while preparing skin.  Choose location for your strip, vertical or horizontal, as indicated in the instruction booklet. Shave to remove all hair from location. There cannot be any lotions, oils, powders, or colognes on skin where monitor is to be applied. Wipe skin clean with enclosed Saline wipe. Dry skin completely.  Peel paper labeled #1 off the back of the Monitor strip exposing the adhesive. Place the monitor on the chest in the vertical or horizontal position shown in the instruction booklet. One arrow on the monitor strip must be pointing upward. Carefully remove paper labeled #2, attaching remainder of strip to your skin. Try not to create any folds or wrinkles in the strip as you apply it.  Firmly press and release the circle in the  center of the monitor battery. You will hear a small beep. This is turning the monitor battery on. The heart emblem on the monitor battery will light up every 5 seconds if the monitor battery in turned on and connected to the patient securely. Do not push and hold the circle down as this turns the monitor battery off. The cell phone will locate the monitor battery. A screen will appear on the cell phone checking the connection of your monitor strip. This may read poor connection initially but change to good connection within the next minute. Once your monitor accepts the connection you will hear a series of 3 beeps followed by a climbing crescendo of beeps. A screen will appear on the cell phone showing the two monitor strip placement options. Touch the picture that demonstrates where you applied the monitor strip.  Your monitor strip and battery are waterproof. You are able to shower, bathe, or swim with the monitor on. They just ask you do not submerge deeper than 3 feet underwater. We recommend removing the monitor if you are swimming in a lake, river, or ocean.  Your monitor battery will need to be switched to a fully charged monitor battery approximately once a week. The cell phone will alert you of  an action which needs to be made.  On the cell phone, tap for details to reveal connection status, monitor battery status, and cell phone battery status. The green dots indicates your monitor is in good status. A red dot indicates there is something that needs your attention.  To record a symptom, click the circle on the monitor battery. In 30-60 seconds a list of symptoms will appear on the cell phone. Select your symptom and tap save. Your monitor will record a sustained or significant arrhythmia regardless of you clicking the button. Some patients do not feel the heart rhythm irregularities. [employer] will notify us of any serious or critical events.  Refer to instruction booklet for  instructions on switching batteries, changing strips, the Do not disturb or Pause features, or any additional questions.  Call [employer] at [phone], to confirm your monitor is transmitting and record your baseline. They will answer any questions you may have regarding the monitor instructions at that time.  Returning the monitor to [place] all equipment back into blue box. Peel off strip of paper to expose adhesive and close box securely. There is a prepaid UPS shipping label on this box. Drop in a UPS drop box, or at a UPS facility like [employer]. You may also contact [employer] to arrange UPS to pick up monitor package at your home.   Follow-Up: At [facility], you and your health needs are our priority.  As part of our continuing mission to provide you with exceptional heart care, we have created designated Provider Care Teams.  These Care Teams include your primary Cardiologist (physician) and Advanced Practice Providers (APPs -  Physician Assistants and Nurse Practitioners) who all work together to provide you with the care you need, when you need it.  We recommend signing up for the patient portal called "MyChart".  Sign up information is provided on this After Visit Summary.  MyChart is used to connect with patients for Virtual Visits (Telemedicine).  Patients are able to view lab/test results, encounter notes, upcoming appointments, etc.  Non-urgent messages can be sent to your provider as well.   To learn more about what you can do with MyChart, go to [email or web address].    Your next appointment:   3 month(s)  The format for your next appointment:   In Person  Provider: Quay Burow, MD

## 2021-05-23 NOTE — Assessment & Plan Note (Signed)
History of essential pretension blood pressure measured today at 112/72.  She is on benazepril.

## 2021-05-23 NOTE — Assessment & Plan Note (Signed)
History of orthostatic hypotension with blood pressure measured today lying of 110/76, sitting of 91/61.  Her pulse went from 74-1 02.  She denies dizziness when changing position however.

## 2021-05-23 NOTE — Assessment & Plan Note (Signed)
Both of her parents had CABG in their late 60s/early 69s.

## 2021-05-23 NOTE — Progress Notes (Signed)
05/23/2021 Alexandra Berger   [birth year]  [id number]  Primary Physician [facility] Primary Cardiologist: [name] MD Lupe Carney, [place]  HPI:  Alexandra Berger is a 70 y.o. moderately overweight widowed Caucasian female mother of 2, grandmother of 4 grandchildren referred by Faustino Congress, FNP for evaluation of orthostatic hypotension.  She is accompanied by her daughter Arrie Aran today.  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  She smoked remotely having quit over 20 years ago.  She does have treated hypertension, diabetes and hyperlipidemia.  Both her parents had CABG.  She is never had a heart attack or stroke.  She denies chest pain or shortness of breath.  She recently moved from [place] to [place] to be closer to family.  Her son who has Down syndrome lives with her assess her grandson.  She is been more active since moving.  She currently does not drive.  She had 2 episodes of unwitnessed syncope around 5 to 6 months ago but none since.  She was documented to have orthostasis but tells me she is asymptomatic from this.   Current Meds  Medication Sig   atorvastatin (LIPITOR) 20 MG tablet Take 20 mg by mouth daily.   benazepril (LOTENSIN) 20 MG tablet Take 20 mg by mouth daily.    citalopram (CELEXA) 20 MG tablet Take 20 mg by mouth daily.    cyclobenzaprine (FLEXERIL) 5 MG tablet Take 5 mg by mouth daily.    cycloSPORINE (RESTASIS) 0.05 % ophthalmic emulsion Place 1 drop into both eyes 2 (two) times daily.    ergocalciferol (VITAMIN D2) 1.25 MG (50000 UT) capsule Take 50,000 Units by mouth every 30 (thirty) days.   famotidine (PEPCID) 20 MG tablet Take 20 mg by mouth at bedtime.   Flax Oil-Fish Oil-Borage Oil (FISH OIL-FLAX OIL-BORAGE OIL PO) Take 1 capsule by mouth daily.    gabapentin (NEURONTIN) 300 MG capsule Take 300 mg by mouth 3 (three) times daily.    insulin detemir  (LEVEMIR) 100 UNIT/ML injection Inject 24-48 Units into the skin See admin instructions. Inject 48 units SQ in the morning and inject 24 units SQ in the evening   omeprazole (PRILOSEC) 20 MG capsule Take 20 mg by mouth daily.   warfarin (COUMADIN) 1 MG tablet Take 2 mg by mouth at bedtime.    [DISCONTINUED] digoxin (LANOXIN) 0.125 MG tablet Take 0.125 mg by mouth daily.    [DISCONTINUED] polyethylene glycol-electrolytes (TRILYTE) 420 g solution Take 4,000 mLs by mouth as directed.     Allergies  Allergen Reactions   Statins Other (See Comments)    dizziness    Social History   Socioeconomic History   Marital status: Widowed    Spouse name: Not on file   Number of children: Not on file   Years of education: Not on file   Highest education level: Not on file  Occupational History   Occupation: retired    Comment: Airline pilot  Tobacco Use   Smoking status: Former    Packs/day: 0.50    Years: 7.00    Pack years: 3.50    Types: Cigarettes    Quit date: 02/23/2002    Years since quitting: 19.2   Smokeless tobacco: Never  Vaping Use   Vaping Use: Never used  Substance and Sexual Activity   Alcohol use: No   Drug use: No   Sexual activity: Not on file  Other Topics Concern   Not on file  Social History Narrative   Not on file   Social Determinants of Health   Financial Resource Strain: Not on file  Food Insecurity: Not on file  Transportation Needs: Not on file  Physical Activity: Not on file  Stress: Not on file  Social Connections: Not on file  Intimate Partner Violence: Not on file     Review of Systems: General: negative for chills, fever, night sweats or weight changes.  Cardiovascular: negative for chest pain, dyspnea on exertion, edema, orthopnea, palpitations, paroxysmal nocturnal dyspnea or shortness of breath Dermatological: negative for rash Respiratory: negative for cough or wheezing Urologic: negative for hematuria Abdominal: negative for nausea, vomiting,  diarrhea, bright red blood per rectum, melena, or hematemesis Neurologic: negative for visual changes, syncope, or dizziness All other systems reviewed and are otherwise negative except as noted above.    Blood pressure 112/72, pulse 81, height 5\' 3"  (1.6 m), weight 211 lb 6.4 oz (95.9 kg), SpO2 94 %.  General appearance: alert and no distress Neck: no adenopathy, no carotid bruit, no JVD, supple, symmetrical, trachea midline, and thyroid not enlarged, symmetric, no tenderness/mass/nodules Lungs: clear to auscultation bilaterally Heart: regular rate and rhythm, S1, S2 normal, no murmur, click, rub or gallop Extremities: extremities normal, atraumatic, no cyanosis or edema Pulses: 2+ and symmetric Skin: Skin color, texture, turgor normal. No rashes or lesions Neurologic: Grossly normal  EKG sinus rhythm at 81 with low limb voltage and septal Q waves.  I personally reviewed this EKG.  ASSESSMENT AND PLAN:   Family history of heart disease Both of her parents had CABG in their late 60s/early 53s.  Syncope and collapse 2 episodes of syncope 5 to 6 months ago without warning.  No episodes since.  She does not drive.  I am going to get a 30-day Zio patch to further evaluate.  Hyperlipidemia History of hyperlipidemia on atorvastatin with lipid profile performed 03/14/2021 revealing total cholesterol 207, LDL 135 and HDL 53.  I am going to get a coronary calcium score to help determine degree of aggressiveness with risk factor modification.  Essential hypertension History of essential pretension blood pressure measured today at 112/72.  She is on benazepril.  Orthostatic hypotension History of orthostatic hypotension with blood pressure measured today lying of 110/76, sitting of 91/61.  Her pulse went from 74-1 02.  She denies dizziness when changing position however.     [name] MD FACP,FACC,FAHA, [facility] 05/23/2021 2:26 PM

## 2021-05-23 NOTE — Assessment & Plan Note (Signed)
History of hyperlipidemia on atorvastatin with lipid profile performed 03/14/2021 revealing total cholesterol 207, LDL 135 and HDL 53.  I am going to get a coronary calcium score to help determine degree of aggressiveness with risk factor modification.

## 2021-05-26 NOTE — Addendum Note (Signed)
Addended by: Wonda Horner on: 05/26/2021 05:08 PM   Modules accepted: Orders

## 2021-05-28 ENCOUNTER — Ambulatory Visit (INDEPENDENT_AMBULATORY_CARE_PROVIDER_SITE_OTHER): Payer: Medicare HMO

## 2021-05-28 DIAGNOSIS — I951 Orthostatic hypotension: Secondary | ICD-10-CM

## 2021-05-28 DIAGNOSIS — R55 Syncope and collapse: Secondary | ICD-10-CM | POA: Diagnosis not present

## 2021-06-12 ENCOUNTER — Ambulatory Visit (INDEPENDENT_AMBULATORY_CARE_PROVIDER_SITE_OTHER)
Admission: RE | Admit: 2021-06-12 | Discharge: 2021-06-12 | Disposition: A | Discharge status: Home / Self Care | Payer: Self-pay | Source: Ambulatory Visit | Attending: Cardiovascular Disease | Admitting: Cardiovascular Disease

## 2021-06-12 ENCOUNTER — Other Ambulatory Visit: Payer: Self-pay

## 2021-06-12 DIAGNOSIS — Z8249 Family history of ischemic heart disease and other diseases of the circulatory system: Secondary | ICD-10-CM

## 2021-06-12 DIAGNOSIS — E782 Mixed hyperlipidemia: Secondary | ICD-10-CM

## 2021-06-14 ENCOUNTER — Telehealth: Payer: Self-pay

## 2021-06-14 DIAGNOSIS — E782 Mixed hyperlipidemia: Secondary | ICD-10-CM

## 2021-06-14 MED ORDER — ATORVASTATIN CALCIUM 80 MG PO TABS: 80.0000 mg | ORAL_TABLET | Freq: Every day | ORAL | 3 refills | Status: DC

## 2021-06-14 NOTE — Telephone Encounter (Signed)
Alexandra Harp, MD  Beatrix Fetters, RN Increase Atorva 20--->80 secondary to increased CCS. Re check 3 months   Spoke with pt regarding Dr. Kennon Holter recommendations regarding coronary Calcium score. Pt ok with plan to increase atorvastatin to 80mg  once daily. Prescription sent to pharmacy. Will repeat labs in 3 months. Incidental finding from CT also reported to pt. Pt verbalizes understanding.

## 2021-06-23 ENCOUNTER — Other Ambulatory Visit: Payer: Self-pay

## 2021-06-23 ENCOUNTER — Encounter: Payer: Self-pay | Admitting: Pulmonary Disease

## 2021-06-23 ENCOUNTER — Ambulatory Visit (INDEPENDENT_AMBULATORY_CARE_PROVIDER_SITE_OTHER): Payer: Medicare HMO | Admitting: Pulmonary Disease

## 2021-06-23 VITALS — BP 102/64 | HR 88 | Temp 99.5°F | Ht 64.0 in | Wt 208.8 lb

## 2021-06-23 DIAGNOSIS — R918 Other nonspecific abnormal finding of lung field: Secondary | ICD-10-CM

## 2021-06-23 DIAGNOSIS — R911 Solitary pulmonary nodule: Secondary | ICD-10-CM

## 2021-06-23 DIAGNOSIS — Z87891 Personal history of nicotine dependence: Secondary | ICD-10-CM

## 2021-06-23 NOTE — Patient Instructions (Addendum)
Thank you for visiting Dr. [name] at [facility]. Today we recommend the following:  Orders Placed This Encounter  Procedures   NM PET Image Initial (PI) Skull Base To Thigh (F-18 FDG)   We will get PET scheduled before seeing [place] in the office.   Return in about 3 weeks (around 07/14/2021) for w/ Dr. [name] .    Please do your part to reduce the spread of COVID-19.

## 2021-06-23 NOTE — Progress Notes (Signed)
Synopsis: Referred in December 2022 for lung nodule by Faustino Congress, NP  Subjective:   PATIENT ID: Alexandra Berger GENDER: female DOB: 1951-02-26, MRN: [id number]  Chief Complaint  Patient presents with   Consult    Patient is here to talk about spot on her lung    Is a 70 year old female, past medical history of diabetes, hypertension.  Patient was recently seen by cardiology with a calcium scoring CT. calcium scoring CT was completed on 06/12/2021.  This found a incidental lung nodule.  Patient was found to have a 2.5 similar left lower lobe pulmonary nodule patient was referred here for evaluation.  She quit smoking in 2003.  No family history of lung cancer, no personal history of malignancy.  She has not had a mammogram recently.  Not up-to-date with colon cancer screening.   Past Medical History:  Diagnosis Date   Anxiety and depression    Chronic back pain    Diabetes mellitus without complication (HCC)    DJD (degenerative joint disease)    Hypertension      Family History  Problem Relation Age of Onset   CAD Mother    CAD Father    Heart attack Father    Colon cancer Brother 40     Past Surgical History:  Procedure Laterality Date   ABDOMINAL HYSTERECTOMY     CARDIAC CATHETERIZATION     2014   DILATION AND CURETTAGE OF UTERUS     EYE SURGERY Left    X 2    Social History   Socioeconomic History   Marital status: Widowed    Spouse name: Not on file   Number of children: Not on file   Years of education: Not on file   Highest education level: Not on file  Occupational History   Occupation: retired    Comment: Airline pilot  Tobacco Use   Smoking status: Former    Packs/day: 0.50    Years: 7.00    Pack years: 3.50    Types: Cigarettes    Quit date: 02/23/2002    Years since quitting: 19.3   Smokeless tobacco: Never  Vaping Use   Vaping Use: Never used  Substance and Sexual Activity   Alcohol use: No   Drug use: No   Sexual activity: Not on  file  Other Topics Concern   Not on file  Social History Narrative   Not on file   Social Determinants of Health   Financial Resource Strain: Not on file  Food Insecurity: Not on file  Transportation Needs: Not on file  Physical Activity: Not on file  Stress: Not on file  Social Connections: Not on file  Intimate Partner Violence: Not on file     Allergies  Allergen Reactions   Statins Other (See Comments)    dizziness     Outpatient Medications Prior to Visit  Medication Sig Dispense Refill   atorvastatin (LIPITOR) 80 MG tablet Take 1 tablet (80 mg total) by mouth daily. 90 tablet 3   benazepril (LOTENSIN) 20 MG tablet Take 20 mg by mouth daily.      citalopram (CELEXA) 20 MG tablet Take 20 mg by mouth daily.      cyclobenzaprine (FLEXERIL) 5 MG tablet Take 5 mg by mouth daily.      cycloSPORINE (RESTASIS) 0.05 % ophthalmic emulsion Place 1 drop into both eyes 2 (two) times daily.      ergocalciferol (VITAMIN D2) 1.25 MG (50000 UT) capsule Take 50,000 Units by mouth  every 30 (thirty) days.     famotidine (PEPCID) 20 MG tablet Take 20 mg by mouth at bedtime.     Flax Oil-Fish Oil-Borage Oil (FISH OIL-FLAX OIL-BORAGE OIL PO) Take 1 capsule by mouth daily.      gabapentin (NEURONTIN) 300 MG capsule Take 300 mg by mouth 3 (three) times daily.      insulin detemir (LEVEMIR) 100 UNIT/ML injection Inject 24-48 Units into the skin See admin instructions. Inject 48 units SQ in the morning and inject 24 units SQ in the evening     omeprazole (PRILOSEC) 20 MG capsule Take 20 mg by mouth daily.     warfarin (COUMADIN) 1 MG tablet Take 2 mg by mouth at bedtime.      No facility-administered medications prior to visit.    Review of Systems  Constitutional:  Negative for chills, fever, malaise/fatigue and weight loss.  HENT:  Negative for hearing loss, sore throat and tinnitus.   Eyes:  Negative for blurred vision and double vision.  Respiratory:  Negative for cough, hemoptysis, sputum  production, shortness of breath, wheezing and stridor.   Cardiovascular:  Negative for chest pain, palpitations, orthopnea, leg swelling and PND.  Gastrointestinal:  Negative for abdominal pain, constipation, diarrhea, heartburn, nausea and vomiting.  Genitourinary:  Negative for dysuria, hematuria and urgency.  Musculoskeletal:  Negative for joint pain and myalgias.  Skin:  Negative for itching and rash.  Neurological:  Negative for dizziness, tingling, weakness and headaches.  Endo/Heme/Allergies:  Negative for environmental allergies. Does not bruise/bleed easily.  Psychiatric/Behavioral:  Negative for depression. The patient is not nervous/anxious and does not have insomnia.   All other systems reviewed and are negative.   Objective:  Physical Exam Vitals reviewed.  Constitutional:      General: She is not in acute distress.    Appearance: She is well-developed.  HENT:     Head: Normocephalic and atraumatic.  Eyes:     General: No scleral icterus.    Conjunctiva/sclera: Conjunctivae normal.     Pupils: Pupils are equal, round, and reactive to light.  Neck:     Vascular: No JVD.     Trachea: No tracheal deviation.  Cardiovascular:     Rate and Rhythm: Normal rate and regular rhythm.     Heart sounds: Normal heart sounds. No murmur heard. Pulmonary:     Effort: Pulmonary effort is normal. No tachypnea, accessory muscle usage or respiratory distress.     Breath sounds: No stridor. No wheezing, rhonchi or rales.  Abdominal:     General: There is no distension.     Palpations: Abdomen is soft.     Tenderness: There is no abdominal tenderness.  Musculoskeletal:        General: No tenderness.     Cervical back: Neck supple.  Lymphadenopathy:     Cervical: No cervical adenopathy.  Skin:    General: Skin is warm and dry.     Capillary Refill: Capillary refill takes less than 2 seconds.     Findings: No rash.  Neurological:     Mental Status: She is alert and oriented to  person, place, and time.  Psychiatric:        Behavior: Behavior normal.     Vitals:   06/23/21 1145  BP: 102/64  Pulse: 88  Temp: 99.5 F (37.5 C)  TempSrc: Oral  SpO2: 96%  Weight: 208 lb 12.8 oz (94.7 kg)  Height: 5\' 4"  (1.626 m)   96% on RA BMI Readings  from Last 3 Encounters:  06/23/21 35.84 kg/m  05/23/21 37.45 kg/m  08/14/18 34.28 kg/m   Wt Readings from Last 3 Encounters:  06/23/21 208 lb 12.8 oz (94.7 kg)  05/23/21 211 lb 6.4 oz (95.9 kg)  08/14/18 206 lb (93.4 kg)     CBC No results found for: WBC, RBC, HGB, HCT, PLT, MCV, MCH, MCHC, RDW, LYMPHSABS, MONOABS, EOSABS, BASOSABS  Chest Imaging: 06/12/2021 calcium scoring CT: 2.5 cm left lower lobe pulmonary nodule smooth edges. The patient's images have been independently reviewed by me.    Pulmonary Functions Testing Results: No flowsheet data found.  FeNO:   Pathology:   Echocardiogram:   Heart Catheterization:     Assessment & Plan:     ICD-10-CM   1. Lung nodule  R91.1 NM PET Image Initial (PI) Skull Base To Thigh (F-18 FDG)    2. Former smoker  Z87.891     3. Abnormal CT scan, lung  R91.8       Discussion: This is a 70 year old female presents with a incidentally found left lower lobe pulmonary nodule 2.5 cm.  This lesion has smooth borders on it.  She has no previous axial CT imaging of the chest greater than 10 years.  She states she may have had something more than 10 years ago in [place].  So we have no additional imaging to compare this to.  Plan: I think the next best step for consideration of evaluation with pet imaging. Pending upon the PET scan I explained that we may recommend undergoing biopsy. We talked about the risk benefits and alternatives of proceeding with biopsy. We will have her follow-up to see me after the PET scan to discuss next steps. Appreciate PCC's help with scheduling.   Current Outpatient Medications:    atorvastatin (LIPITOR) 80 MG tablet, Take 1  tablet (80 mg total) by mouth daily., Disp: 90 tablet, Rfl: 3   benazepril (LOTENSIN) 20 MG tablet, Take 20 mg by mouth daily. , Disp: , Rfl:    citalopram (CELEXA) 20 MG tablet, Take 20 mg by mouth daily. , Disp: , Rfl:    cyclobenzaprine (FLEXERIL) 5 MG tablet, Take 5 mg by mouth daily. , Disp: , Rfl:    cycloSPORINE (RESTASIS) 0.05 % ophthalmic emulsion, Place 1 drop into both eyes 2 (two) times daily. , Disp: , Rfl:    ergocalciferol (VITAMIN D2) 1.25 MG (50000 UT) capsule, Take 50,000 Units by mouth every 30 (thirty) days., Disp: , Rfl:    famotidine (PEPCID) 20 MG tablet, Take 20 mg by mouth at bedtime., Disp: , Rfl:    Flax Oil-Fish Oil-Borage Oil (FISH OIL-FLAX OIL-BORAGE OIL PO), Take 1 capsule by mouth daily. , Disp: , Rfl:    gabapentin (NEURONTIN) 300 MG capsule, Take 300 mg by mouth 3 (three) times daily. , Disp: , Rfl:    insulin detemir (LEVEMIR) 100 UNIT/ML injection, Inject 24-48 Units into the skin See admin instructions. Inject 48 units SQ in the morning and inject 24 units SQ in the evening, Disp: , Rfl:    omeprazole (PRILOSEC) 20 MG capsule, Take 20 mg by mouth daily., Disp: , Rfl:    warfarin (COUMADIN) 1 MG tablet, Take 2 mg by mouth at bedtime. , Disp: , Rfl:   I spent 63 minutes dedicated to the care of this patient on the date of this encounter to include pre-visit review of records, face-to-face time with the patient discussing conditions above, post visit ordering of testing, clinical documentation with  the electronic health record, making appropriate referrals as documented, and communicating necessary findings to members of the patients care team.   Garner Nash, DO [facility] 06/23/2021 3:00 PM

## 2021-06-27 ENCOUNTER — Telehealth: Payer: Self-pay | Admitting: Pulmonary Disease

## 2021-06-27 NOTE — Telephone Encounter (Signed)
Left voicemail for patient to call back to schedule follow up with Dr. [name] on 01/13 at 4pm.

## 2021-06-27 NOTE — Telephone Encounter (Signed)
-----   Message from Garner Nash, DO sent at 06/23/2021  2:57 PM EST ----- Regarding: f/u appt Mel Almond   Per the AVS I asked her to make an appt on the 20th? But looks like she has PET on 12th. Can we get her in for a 15 min follow up on the 13th? Or with Eric Form? After the PET is complete.   Thanks  Lance Muss, DO [facility] 06/23/2021 2:59 PM

## 2021-07-06 ENCOUNTER — Other Ambulatory Visit: Payer: Self-pay

## 2021-07-06 ENCOUNTER — Ambulatory Visit (HOSPITAL_COMMUNITY)
Admission: RE | Admit: 2021-07-06 | Discharge: 2021-07-06 | Disposition: A | Discharge status: Home / Self Care | Payer: Medicare HMO | Source: Ambulatory Visit | Attending: Pulmonary Disease | Admitting: Pulmonary Disease

## 2021-07-06 DIAGNOSIS — R911 Solitary pulmonary nodule: Secondary | ICD-10-CM | POA: Insufficient documentation

## 2021-07-06 DIAGNOSIS — I700 Atherosclerosis of aorta: Secondary | ICD-10-CM | POA: Diagnosis not present

## 2021-07-06 DIAGNOSIS — K449 Diaphragmatic hernia without obstruction or gangrene: Secondary | ICD-10-CM | POA: Diagnosis not present

## 2021-07-06 DIAGNOSIS — R918 Other nonspecific abnormal finding of lung field: Secondary | ICD-10-CM | POA: Diagnosis not present

## 2021-07-06 DIAGNOSIS — I2510 Atherosclerotic heart disease of native coronary artery without angina pectoris: Secondary | ICD-10-CM | POA: Diagnosis not present

## 2021-07-06 LAB — GLUCOSE, CAPILLARY
Glucose-Capillary: 37 mg/dL — CL (ref 70–99)
Glucose-Capillary: 43 mg/dL — CL (ref 70–99)
Glucose-Capillary: 46 mg/dL — ABNORMAL LOW (ref 70–99)
Glucose-Capillary: 112 mg/dL — ABNORMAL HIGH (ref 70–99)

## 2021-07-06 MED ORDER — FLUDEOXYGLUCOSE F - 18 (FDG) INJECTION
9.6000 | Freq: Once | INTRAVENOUS | Status: AC | PRN
  Administered 2021-07-06: 9.6 via INTRAVENOUS

## 2021-07-06 NOTE — Telephone Encounter (Signed)
Left voicemail for patient to schedule an appointment with BI or NP 07/07/2021 to discuss PET results. If scheduled with NP, BI will come in and talk to patient as well.

## 2021-07-13 DIAGNOSIS — Z6832 Body mass index (BMI) 32.0-32.9, adult: Secondary | ICD-10-CM | POA: Diagnosis not present

## 2021-07-13 DIAGNOSIS — I10 Essential (primary) hypertension: Secondary | ICD-10-CM | POA: Diagnosis not present

## 2021-07-13 DIAGNOSIS — Z794 Long term (current) use of insulin: Secondary | ICD-10-CM | POA: Diagnosis not present

## 2021-07-13 DIAGNOSIS — N1831 Chronic kidney disease, stage 3a: Secondary | ICD-10-CM | POA: Diagnosis not present

## 2021-07-13 DIAGNOSIS — R911 Solitary pulmonary nodule: Secondary | ICD-10-CM | POA: Diagnosis not present

## 2021-07-13 DIAGNOSIS — E1122 Type 2 diabetes mellitus with diabetic chronic kidney disease: Secondary | ICD-10-CM | POA: Diagnosis not present

## 2021-07-13 DIAGNOSIS — E669 Obesity, unspecified: Secondary | ICD-10-CM | POA: Diagnosis not present

## 2021-07-13 DIAGNOSIS — E162 Hypoglycemia, unspecified: Secondary | ICD-10-CM | POA: Diagnosis not present

## 2021-07-24 ENCOUNTER — Ambulatory Visit: Payer: Medicare HMO | Admitting: Pulmonary Disease

## 2021-07-24 NOTE — Progress Notes (Incomplete)
Synopsis: Referred in December 2022 for lung nodule by [facility]  Subjective:   PATIENT ID: Alexandra Berger GENDER: female DOB: 07/29/1950, MRN: [id number]  No chief complaint on file.   Is a 71 year old female, past medical history of diabetes, hypertension.  Patient was recently seen by cardiology with a calcium scoring CT. calcium scoring CT was completed on 06/12/2021.  This found a incidental lung nodule.  Patient was found to have a 2.5 similar left lower lobe pulmonary nodule patient was referred here for evaluation.  She quit smoking in 2003.  No family history of lung cancer, no personal history of malignancy.  She has not had a mammogram recently.  Not up-to-date with colon cancer screening.  OV 07/24/2021:Here for follow-up after recent nuclear medicine pet imaging on 07/06/2021.  This revealed a 2.4 cm well-circumscribed pulmonary nodule in the anterior left lower lobe with mild low-level hypermetabolism suggestive of a potential pulmonary neuroendocrine tumor or low-grade carcinoma.   Past Medical History:  Diagnosis Date   Anxiety and depression    Chronic back pain    Diabetes mellitus without complication (HCC)    DJD (degenerative joint disease)    Hypertension      Family History  Problem Relation Age of Onset   CAD Mother    CAD Father    Heart attack Father    Colon cancer Brother 73     Past Surgical History:  Procedure Laterality Date   ABDOMINAL HYSTERECTOMY     CARDIAC CATHETERIZATION     2014   DILATION AND CURETTAGE OF UTERUS     EYE SURGERY Left    X 2    Social History   Socioeconomic History   Marital status: Widowed    Spouse name: Not on file   Number of children: Not on file   Years of education: Not on file   Highest education level: Not on file  Occupational History   Occupation: retired    Comment: Airline pilot  Tobacco Use   Smoking status: Former    Packs/day: 0.50    Years: 7.00    Pack  years: 3.50    Types: Cigarettes    Quit date: 02/23/2002    Years since quitting: 19.4   Smokeless tobacco: Never  Vaping Use   Vaping Use: Never used  Substance and Sexual Activity   Alcohol use: No   Drug use: No   Sexual activity: Not on file  Other Topics Concern   Not on file  Social History Narrative   Not on file   Social Determinants of Health   Financial Resource Strain: Not on file  Food Insecurity: Not on file  Transportation Needs: Not on file  Physical Activity: Not on file  Stress: Not on file  Social Connections: Not on file  Intimate Partner Violence: Not on file     Allergies  Allergen Reactions   Statins Other (See Comments)    dizziness     Outpatient Medications Prior to Visit  Medication Sig Dispense Refill   atorvastatin (LIPITOR) 80 MG tablet Take 1 tablet (80 mg total) by mouth daily. 90 tablet 3   benazepril (LOTENSIN) 20 MG tablet Take 20 mg by mouth daily.      citalopram (CELEXA) 20 MG tablet Take 20 mg by mouth daily.      cyclobenzaprine (FLEXERIL) 5 MG tablet Take 5 mg by mouth daily.      cycloSPORINE (RESTASIS) 0.05 % ophthalmic emulsion Place 1 drop  into both eyes 2 (two) times daily.      ergocalciferol (VITAMIN D2) 1.25 MG (50000 UT) capsule Take 50,000 Units by mouth every 30 (thirty) days.     famotidine (PEPCID) 20 MG tablet Take 20 mg by mouth at bedtime.     Flax Oil-Fish Oil-Borage Oil (FISH OIL-FLAX OIL-BORAGE OIL PO) Take 1 capsule by mouth daily.      gabapentin (NEURONTIN) 300 MG capsule Take 300 mg by mouth 3 (three) times daily.      insulin detemir (LEVEMIR) 100 UNIT/ML injection Inject 24-48 Units into the skin See admin instructions. Inject 48 units SQ in the morning and inject 24 units SQ in the evening     omeprazole (PRILOSEC) 20 MG capsule Take 20 mg by mouth daily.     warfarin (COUMADIN) 1 MG tablet Take 2 mg by mouth at bedtime.      No facility-administered medications prior to visit.     Review of Systems  Constitutional:  Negative for chills, fever, malaise/fatigue and weight loss.  HENT:  Negative for hearing loss, sore throat and tinnitus.   Eyes:  Negative for blurred vision and double vision.  Respiratory:  Negative for cough, hemoptysis, sputum production, shortness of breath, wheezing and stridor.   Cardiovascular:  Negative for chest pain, palpitations, orthopnea, leg swelling and PND.  Gastrointestinal:  Negative for abdominal pain, constipation, diarrhea, heartburn, nausea and vomiting.  Genitourinary:  Negative for dysuria, hematuria and urgency.  Musculoskeletal:  Negative for joint pain and myalgias.  Skin:  Negative for itching and rash.  Neurological:  Negative for dizziness, tingling, weakness and headaches.  Endo/Heme/Allergies:  Negative for environmental allergies. Does not bruise/bleed easily.  Psychiatric/Behavioral:  Negative for depression. The patient is not nervous/anxious and does not have insomnia.   All other systems reviewed and are negative.   Objective:  Physical Exam Vitals reviewed.  Constitutional:      General: She is not in acute distress.    Appearance: She is well-developed.  HENT:     Head: Normocephalic and atraumatic.  Eyes:     General: No scleral icterus.    Conjunctiva/sclera: Conjunctivae normal.     Pupils: Pupils are equal, round, and reactive to light.  Neck:     Vascular: No JVD.     Trachea: No tracheal deviation.  Cardiovascular:     Rate and Rhythm: Normal rate and regular rhythm.     Heart sounds: Normal heart sounds. No murmur heard. Pulmonary:     Effort: Pulmonary effort is normal. No tachypnea, accessory muscle usage or respiratory distress.     Breath sounds: No stridor. No wheezing, rhonchi or rales.  Abdominal:     General: There is no distension.     Palpations: Abdomen is soft.     Tenderness: There is no abdominal tenderness.  Musculoskeletal:        General: No tenderness.     Cervical  back: Neck supple.  Lymphadenopathy:     Cervical: No cervical adenopathy.  Skin:    General: Skin is warm and dry.     Capillary Refill: Capillary refill takes less than 2 seconds.     Findings: No rash.  Neurological:     Mental Status: She is alert and oriented to person, place, and time.  Psychiatric:        Behavior: Behavior normal.     There were no vitals filed for this visit.    on RA BMI Readings from Last 3 Encounters:  06/23/21 35.84 kg/m  05/23/21 37.45 kg/m  08/14/18 34.28 kg/m   Wt Readings from Last 3 Encounters:  06/23/21 208 lb 12.8 oz (94.7 kg)  05/23/21 211 lb 6.4 oz (95.9 kg)  08/14/18 206 lb (93.4 kg)     CBC No results found for: WBC, RBC, HGB, HCT, PLT, MCV, MCH, MCHC, RDW, LYMPHSABS, MONOABS, EOSABS, BASOSABS  Chest Imaging: 06/12/2021 calcium scoring CT: 2.5 cm left lower lobe pulmonary nodule smooth edges. The patient's images have been independently reviewed by me.    Pulmonary Functions Testing Results: No flowsheet data found.  FeNO:   Pathology:   Echocardiogram:   Heart Catheterization:     Assessment & Plan:   No diagnosis found.   Discussion: This is a 71 year old female presents with a incidentally found left lower lobe pulmonary nodule 2.5 cm.  This lesion has smooth borders on it.  She has no previous axial CT imaging of the chest greater than 10 years.  She states she may have had something more than 10 years ago in [place].  So we have no additional imaging to compare this to.  Plan: I think the next best step for consideration of evaluation with pet imaging. Pending upon the PET scan I explained that we may recommend undergoing biopsy. We talked about the risk benefits and alternatives of proceeding with biopsy. We will have her follow-up to see me after the PET scan to discuss next steps. Appreciate PCC's help with scheduling.   Current Outpatient Medications:    atorvastatin (LIPITOR) 80 MG tablet, Take 1  tablet (80 mg total) by mouth daily., Disp: 90 tablet, Rfl: 3   benazepril (LOTENSIN) 20 MG tablet, Take 20 mg by mouth daily. , Disp: , Rfl:    citalopram (CELEXA) 20 MG tablet, Take 20 mg by mouth daily. , Disp: , Rfl:    cyclobenzaprine (FLEXERIL) 5 MG tablet, Take 5 mg by mouth daily. , Disp: , Rfl:    cycloSPORINE (RESTASIS) 0.05 % ophthalmic emulsion, Place 1 drop into both eyes 2 (two) times daily. , Disp: , Rfl:    ergocalciferol (VITAMIN D2) 1.25 MG (50000 UT) capsule, Take 50,000 Units by mouth every 30 (thirty) days., Disp: , Rfl:    famotidine (PEPCID) 20 MG tablet, Take 20 mg by mouth at bedtime., Disp: , Rfl:    Flax Oil-Fish Oil-Borage Oil (FISH OIL-FLAX OIL-BORAGE OIL PO), Take 1 capsule by mouth daily. , Disp: , Rfl:    gabapentin (NEURONTIN) 300 MG capsule, Take 300 mg by mouth 3 (three) times daily. , Disp: , Rfl:    insulin detemir (LEVEMIR) 100 UNIT/ML injection, Inject 24-48 Units into the skin See admin instructions. Inject 48 units SQ in the morning and inject 24 units SQ in the evening, Disp: , Rfl:    omeprazole (PRILOSEC) 20 MG capsule, Take 20 mg by mouth daily., Disp: , Rfl:    warfarin (COUMADIN) 1 MG tablet, Take 2 mg by mouth at bedtime. , Disp: , Rfl:   I spent 63 minutes dedicated to the care of this patient on the date of this encounter to include pre-visit review of records, face-to-face time with the patient discussing conditions above, post visit ordering of testing, clinical documentation with the electronic health record, making appropriate referrals as documented, and communicating necessary findings to members of the patients care team.   Garner Nash, DO [facility] 07/24/2021 9:06 AM

## 2021-08-02 ENCOUNTER — Encounter: Payer: Self-pay | Admitting: Pulmonary Disease

## 2021-08-02 ENCOUNTER — Telehealth: Payer: Self-pay | Admitting: Pulmonary Disease

## 2021-08-02 ENCOUNTER — Ambulatory Visit: Payer: Medicare HMO | Admitting: Pulmonary Disease

## 2021-08-02 ENCOUNTER — Other Ambulatory Visit: Payer: Self-pay

## 2021-08-02 VITALS — BP 96/54 | HR 90 | Temp 98.4°F | Ht 63.0 in | Wt 204.8 lb

## 2021-08-02 DIAGNOSIS — R911 Solitary pulmonary nodule: Secondary | ICD-10-CM | POA: Insufficient documentation

## 2021-08-02 DIAGNOSIS — C3432 Malignant neoplasm of lower lobe, left bronchus or lung: Secondary | ICD-10-CM | POA: Insufficient documentation

## 2021-08-02 DIAGNOSIS — R942 Abnormal results of pulmonary function studies: Secondary | ICD-10-CM

## 2021-08-02 DIAGNOSIS — Z7409 Other reduced mobility: Secondary | ICD-10-CM | POA: Diagnosis not present

## 2021-08-02 DIAGNOSIS — Z87891 Personal history of nicotine dependence: Secondary | ICD-10-CM

## 2021-08-02 NOTE — H&P (View-Only) (Signed)
Synopsis: Referred in December 2022 for lung nodule by [facility]  Subjective:   PATIENT ID: Alexandra Berger GENDER: female DOB: [birth year], MRN: [id number]  Chief Complaint  Patient presents with   Follow-up    Is a 71 year old female, past medical history of diabetes, hypertension.  Patient was recently seen by cardiology with a calcium scoring CT. calcium scoring CT was completed on 06/12/2021.  This found a incidental lung nodule.  Patient was found to have a 2.5 similar left lower lobe pulmonary nodule patient was referred here for evaluation.  She quit smoking in 2003.  No family history of lung cancer, no personal history of malignancy.  She has not had a mammogram recently.  Not up-to-date with colon cancer screening.  OV 08/02/2021: Here today to follow-up regarding nuclear medicine PET imaging.  Patient has a lung nodule within the left lung with smooth round borders.  PET scan shows low-level uptake that does not rule out a low-grade malignancy versus a neuroendocrine tumor.  At baseline the patient occasionally uses a walker and/or motorized scooter at home.  She also walks with a cane.  Otherwise is able to get around and do the things that she needs to do for completion of her activities of daily living.  Not necessarily significantly respiratory mediated.   Past Medical History:  Diagnosis Date   Anxiety and depression    Chronic back pain    Diabetes mellitus without complication (HCC)    DJD (degenerative joint disease)    Hypertension      Family History  Problem Relation Age of Onset   CAD Mother    CAD Father    Heart attack Father    Colon cancer Brother 27     Past Surgical History:  Procedure Laterality Date   ABDOMINAL HYSTERECTOMY     CARDIAC CATHETERIZATION     2014   DILATION AND CURETTAGE OF UTERUS     EYE SURGERY Left    X 2    Social History   Socioeconomic History   Marital status: Widowed    Spouse name: Not on file    Number of children: Not on file   Years of education: Not on file   Highest education level: Not on file  Occupational History   Occupation: retired    Comment: Airline pilot  Tobacco Use   Smoking status: Former    Packs/day: 0.50    Years: 7.00    Pack years: 3.50    Types: Cigarettes    Quit date: 02/23/2002    Years since quitting: 19.4   Smokeless tobacco: Never  Vaping Use   Vaping Use: Never used  Substance and Sexual Activity   Alcohol use: No   Drug use: No   Sexual activity: Not on file  Other Topics Concern   Not on file  Social History Narrative   Not on file   Social Determinants of Health   Financial Resource Strain: Not on file  Food Insecurity: Not on file  Transportation Needs: Not on file  Physical Activity: Not on file  Stress: Not on file  Social Connections: Not on file  Intimate Partner Violence: Not on file     Allergies  Allergen Reactions   Statins Other (See Comments)    dizziness     Outpatient Medications Prior to Visit  Medication Sig Dispense Refill   atorvastatin (LIPITOR) 80 MG tablet Take 1 tablet (80 mg total) by mouth daily. 90 tablet 3  benazepril (LOTENSIN) 20 MG tablet Take 20 mg by mouth daily.      citalopram (CELEXA) 20 MG tablet Take 20 mg by mouth daily.      cyclobenzaprine (FLEXERIL) 5 MG tablet Take 5 mg by mouth daily.      cycloSPORINE (RESTASIS) 0.05 % ophthalmic emulsion Place 1 drop into both eyes 2 (two) times daily.      ergocalciferol (VITAMIN D2) 1.25 MG (50000 UT) capsule Take 50,000 Units by mouth every 30 (thirty) days.     famotidine (PEPCID) 20 MG tablet Take 20 mg by mouth at bedtime.     Flax Oil-Fish Oil-Borage Oil (FISH OIL-FLAX OIL-BORAGE OIL PO) Take 1 capsule by mouth daily.      gabapentin (NEURONTIN) 300 MG capsule Take 300 mg by mouth 3 (three) times daily.      insulin detemir (LEVEMIR) 100 UNIT/ML injection Inject 24-48 Units into the skin See admin instructions. Inject 48 units SQ in the morning and  inject 24 units SQ in the evening     omeprazole (PRILOSEC) 20 MG capsule Take 20 mg by mouth daily.     warfarin (COUMADIN) 1 MG tablet Take 2 mg by mouth at bedtime.      No facility-administered medications prior to visit.    Review of Systems  Constitutional:  Negative for chills, fever, malaise/fatigue and weight loss.  HENT:  Negative for hearing loss, sore throat and tinnitus.   Eyes:  Negative for blurred vision and double vision.  Respiratory:  Positive for shortness of breath. Negative for cough, hemoptysis, sputum production, wheezing and stridor.   Cardiovascular:  Negative for chest pain, palpitations, orthopnea, leg swelling and PND.  Gastrointestinal:  Negative for abdominal pain, constipation, diarrhea, heartburn, nausea and vomiting.  Genitourinary:  Negative for dysuria, hematuria and urgency.  Musculoskeletal:  Negative for joint pain and myalgias.  Skin:  Negative for itching and rash.  Neurological:  Negative for dizziness, tingling, weakness and headaches.  Endo/Heme/Allergies:  Negative for environmental allergies. Does not bruise/bleed easily.  Psychiatric/Behavioral:  Negative for depression. The patient is not nervous/anxious and does not have insomnia.   All other systems reviewed and are negative.   Objective:  Physical Exam Vitals reviewed.  Constitutional:      General: She is not in acute distress.    Appearance: She is well-developed.  HENT:     Head: Normocephalic and atraumatic.  Eyes:     General: No scleral icterus.    Conjunctiva/sclera: Conjunctivae normal.     Pupils: Pupils are equal, round, and reactive to light.  Neck:     Vascular: No JVD.     Trachea: No tracheal deviation.  Cardiovascular:     Rate and Rhythm: Normal rate and regular rhythm.     Heart sounds: Normal heart sounds. No murmur heard. Pulmonary:     Effort: Pulmonary effort is normal. No tachypnea, accessory muscle usage or respiratory distress.     Breath sounds: No  stridor. No wheezing, rhonchi or rales.  Abdominal:     General: There is no distension.     Palpations: Abdomen is soft.     Tenderness: There is no abdominal tenderness.  Musculoskeletal:        General: No tenderness.     Cervical back: Neck supple.  Lymphadenopathy:     Cervical: No cervical adenopathy.  Skin:    General: Skin is warm and dry.     Capillary Refill: Capillary refill takes less than 2 seconds.  Findings: No rash.  Neurological:     Mental Status: She is alert and oriented to person, place, and time.  Psychiatric:        Behavior: Behavior normal.     Vitals:   08/02/21 1356  BP: (!) 96/54  Pulse: 90  Temp: 98.4 F (36.9 C)  TempSrc: Oral  SpO2: 95%  Weight: 204 lb 12.8 oz (92.9 kg)  Height: 5\' 3"  (1.6 m)   95% on RA BMI Readings from Last 3 Encounters:  08/02/21 36.28 kg/m  06/23/21 35.84 kg/m  05/23/21 37.45 kg/m   Wt Readings from Last 3 Encounters:  08/02/21 204 lb 12.8 oz (92.9 kg)  06/23/21 208 lb 12.8 oz (94.7 kg)  05/23/21 211 lb 6.4 oz (95.9 kg)     CBC No results found for: WBC, RBC, HGB, HCT, PLT, MCV, MCH, MCHC, RDW, LYMPHSABS, MONOABS, EOSABS, BASOSABS  Chest Imaging: 06/12/2021 calcium scoring CT: 2.5 cm left lower lobe pulmonary nodule smooth edges. The patient's images have been independently reviewed by me.    Pulmonary Functions Testing Results: No flowsheet data found.  FeNO:   Pathology:   Echocardiogram:   Heart Catheterization:     Assessment & Plan:     ICD-10-CM   1. Lung nodule  R91.1 Ambulatory referral to Pulmonology    Procedural/ Surgical Case Request: ROBOTIC ASSISTED NAVIGATIONAL BRONCHOSCOPY    CT Super D Chest Wo Contrast    Pulmonary Function Test    2. Abnormal PET scan, lung  R94.2     3. Former smoker  Z87.891     4. Limited mobility  Z74.09        Discussion:  This is a 71 year old female, limited mobility, some balance issues, enlarging pulmonary nodule on the left lung,  abnormal PET scan of the left lung and former smoker.  Plan:  Today we talked about all of the various options to consider undergoing biopsy versus surgical resection of this lesion. She has not had pulmonary function test I do think that she needs those before consideration of surgery. I also think we need to consider a tissue diagnosis before considering surgery.  With her current mobility status I think recovery from surgery may be difficult. If we could even consider a lung sparing type procedure or segmentectomy for removal but I am not sure that this is even possible due to the proximity of the lesion to the hilum. I will have this reviewed by one of our thoracic surgeons. We will go ahead and set up for plans for bronchoscopy and tissue sampling.  Overall I do believe we are likely dealing with a low-grade malignancy such as a neuroendocrine carcinoma/carcinoid.    Current Outpatient Medications:    atorvastatin (LIPITOR) 80 MG tablet, Take 1 tablet (80 mg total) by mouth daily., Disp: 90 tablet, Rfl: 3   benazepril (LOTENSIN) 20 MG tablet, Take 20 mg by mouth daily. , Disp: , Rfl:    citalopram (CELEXA) 20 MG tablet, Take 20 mg by mouth daily. , Disp: , Rfl:    cyclobenzaprine (FLEXERIL) 5 MG tablet, Take 5 mg by mouth daily. , Disp: , Rfl:    cycloSPORINE (RESTASIS) 0.05 % ophthalmic emulsion, Place 1 drop into both eyes 2 (two) times daily. , Disp: , Rfl:    ergocalciferol (VITAMIN D2) 1.25 MG (50000 UT) capsule, Take 50,000 Units by mouth every 30 (thirty) days., Disp: , Rfl:    famotidine (PEPCID) 20 MG tablet, Take 20 mg by mouth at bedtime.,  Disp: , Rfl:    Flax Oil-Fish Oil-Borage Oil (FISH OIL-FLAX OIL-BORAGE OIL PO), Take 1 capsule by mouth daily. , Disp: , Rfl:    gabapentin (NEURONTIN) 300 MG capsule, Take 300 mg by mouth 3 (three) times daily. , Disp: , Rfl:    insulin detemir (LEVEMIR) 100 UNIT/ML injection, Inject 24-48 Units into the skin See admin instructions. Inject  48 units SQ in the morning and inject 24 units SQ in the evening, Disp: , Rfl:    omeprazole (PRILOSEC) 20 MG capsule, Take 20 mg by mouth daily., Disp: , Rfl:     Garner Nash, DO [facility] 08/02/2021 2:18 PM

## 2021-08-02 NOTE — Patient Instructions (Addendum)
Thank you for visiting Dr. [name] at [facility]. Today we recommend the following: Orders Placed This Encounter  Procedures   Procedural/ Surgical Case Request: ROBOTIC ASSISTED NAVIGATIONAL BRONCHOSCOPY   CT Super D Chest Wo Contrast   Ambulatory referral to Pulmonology   Pulmonary Function Test   Bronchoscopy on 08/29/2021  Return in about 5 weeks (around 09/05/2021) for with Eric Form, NP.    Please do your part to reduce the spread of COVID-19.

## 2021-08-02 NOTE — Telephone Encounter (Signed)
Pt scheduled for 3/7 at 9:15 at [facility].  She will go for covid test on 3/3.  CT sched for 3/3 at [facility].  Tyasia gave appt info to pt with letter.

## 2021-08-02 NOTE — Progress Notes (Signed)
Synopsis: Referred in December 2022 for lung nodule by [facility]  Subjective:   PATIENT ID: Alexandra Berger GENDER: female DOB: 27-Jan-1951, MRN: [id number]  Chief Complaint  Patient presents with   Follow-up    Is a 71 year old female, past medical history of diabetes, hypertension.  Patient was recently seen by cardiology with a calcium scoring CT. calcium scoring CT was completed on 06/12/2021.  This found a incidental lung nodule.  Patient was found to have a 2.5 similar left lower lobe pulmonary nodule patient was referred here for evaluation.  She quit smoking in 2003.  No family history of lung cancer, no personal history of malignancy.  She has not had a mammogram recently.  Not up-to-date with colon cancer screening.  OV 08/02/2021: Here today to follow-up regarding nuclear medicine PET imaging.  Patient has a lung nodule within the left lung with smooth round borders.  PET scan shows low-level uptake that does not rule out a low-grade malignancy versus a neuroendocrine tumor.  At baseline the patient occasionally uses a walker and/or motorized scooter at home.  She also walks with a cane.  Otherwise is able to get around and do the things that she needs to do for completion of her activities of daily living.  Not necessarily significantly respiratory mediated.   Past Medical History:  Diagnosis Date   Anxiety and depression    Chronic back pain    Diabetes mellitus without complication (HCC)    DJD (degenerative joint disease)    Hypertension      Family History  Problem Relation Age of Onset   CAD Mother    CAD Father    Heart attack Father    Colon cancer Brother 59     Past Surgical History:  Procedure Laterality Date   ABDOMINAL HYSTERECTOMY     CARDIAC CATHETERIZATION     2014   DILATION AND CURETTAGE OF UTERUS     EYE SURGERY Left    X 2    Social History   Socioeconomic History   Marital status: Widowed    Spouse name: Not on file    Number of children: Not on file   Years of education: Not on file   Highest education level: Not on file  Occupational History   Occupation: retired    Comment: Airline pilot  Tobacco Use   Smoking status: Former    Packs/day: 0.50    Years: 7.00    Pack years: 3.50    Types: Cigarettes    Quit date: 02/23/2002    Years since quitting: 19.4   Smokeless tobacco: Never  Vaping Use   Vaping Use: Never used  Substance and Sexual Activity   Alcohol use: No   Drug use: No   Sexual activity: Not on file  Other Topics Concern   Not on file  Social History Narrative   Not on file   Social Determinants of Health   Financial Resource Strain: Not on file  Food Insecurity: Not on file  Transportation Needs: Not on file  Physical Activity: Not on file  Stress: Not on file  Social Connections: Not on file  Intimate Partner Violence: Not on file     Allergies  Allergen Reactions   Statins Other (See Comments)    dizziness     Outpatient Medications Prior to Visit  Medication Sig Dispense Refill   atorvastatin (LIPITOR) 80 MG tablet Take 1 tablet (80 mg total) by mouth daily. 90 tablet 3  benazepril (LOTENSIN) 20 MG tablet Take 20 mg by mouth daily.      citalopram (CELEXA) 20 MG tablet Take 20 mg by mouth daily.      cyclobenzaprine (FLEXERIL) 5 MG tablet Take 5 mg by mouth daily.      cycloSPORINE (RESTASIS) 0.05 % ophthalmic emulsion Place 1 drop into both eyes 2 (two) times daily.      ergocalciferol (VITAMIN D2) 1.25 MG (50000 UT) capsule Take 50,000 Units by mouth every 30 (thirty) days.     famotidine (PEPCID) 20 MG tablet Take 20 mg by mouth at bedtime.     Flax Oil-Fish Oil-Borage Oil (FISH OIL-FLAX OIL-BORAGE OIL PO) Take 1 capsule by mouth daily.      gabapentin (NEURONTIN) 300 MG capsule Take 300 mg by mouth 3 (three) times daily.      insulin detemir (LEVEMIR) 100 UNIT/ML injection Inject 24-48 Units into the skin See admin instructions. Inject 48 units SQ in the morning and  inject 24 units SQ in the evening     omeprazole (PRILOSEC) 20 MG capsule Take 20 mg by mouth daily.     warfarin (COUMADIN) 1 MG tablet Take 2 mg by mouth at bedtime.      No facility-administered medications prior to visit.    Review of Systems  Constitutional:  Negative for chills, fever, malaise/fatigue and weight loss.  HENT:  Negative for hearing loss, sore throat and tinnitus.   Eyes:  Negative for blurred vision and double vision.  Respiratory:  Positive for shortness of breath. Negative for cough, hemoptysis, sputum production, wheezing and stridor.   Cardiovascular:  Negative for chest pain, palpitations, orthopnea, leg swelling and PND.  Gastrointestinal:  Negative for abdominal pain, constipation, diarrhea, heartburn, nausea and vomiting.  Genitourinary:  Negative for dysuria, hematuria and urgency.  Musculoskeletal:  Negative for joint pain and myalgias.  Skin:  Negative for itching and rash.  Neurological:  Negative for dizziness, tingling, weakness and headaches.  Endo/Heme/Allergies:  Negative for environmental allergies. Does not bruise/bleed easily.  Psychiatric/Behavioral:  Negative for depression. The patient is not nervous/anxious and does not have insomnia.   All other systems reviewed and are negative.   Objective:  Physical Exam Vitals reviewed.  Constitutional:      General: She is not in acute distress.    Appearance: She is well-developed.  HENT:     Head: Normocephalic and atraumatic.  Eyes:     General: No scleral icterus.    Conjunctiva/sclera: Conjunctivae normal.     Pupils: Pupils are equal, round, and reactive to light.  Neck:     Vascular: No JVD.     Trachea: No tracheal deviation.  Cardiovascular:     Rate and Rhythm: Normal rate and regular rhythm.     Heart sounds: Normal heart sounds. No murmur heard. Pulmonary:     Effort: Pulmonary effort is normal. No tachypnea, accessory muscle usage or respiratory distress.     Breath sounds: No  stridor. No wheezing, rhonchi or rales.  Abdominal:     General: There is no distension.     Palpations: Abdomen is soft.     Tenderness: There is no abdominal tenderness.  Musculoskeletal:        General: No tenderness.     Cervical back: Neck supple.  Lymphadenopathy:     Cervical: No cervical adenopathy.  Skin:    General: Skin is warm and dry.     Capillary Refill: Capillary refill takes less than 2 seconds.  Findings: No rash.  Neurological:     Mental Status: She is alert and oriented to person, place, and time.  Psychiatric:        Behavior: Behavior normal.     Vitals:   08/02/21 1356  BP: (!) 96/54  Pulse: 90  Temp: 98.4 F (36.9 C)  TempSrc: Oral  SpO2: 95%  Weight: 204 lb 12.8 oz (92.9 kg)  Height: 5\' 3"  (1.6 m)   95% on RA BMI Readings from Last 3 Encounters:  08/02/21 36.28 kg/m  06/23/21 35.84 kg/m  05/23/21 37.45 kg/m   Wt Readings from Last 3 Encounters:  08/02/21 204 lb 12.8 oz (92.9 kg)  06/23/21 208 lb 12.8 oz (94.7 kg)  05/23/21 211 lb 6.4 oz (95.9 kg)     CBC No results found for: WBC, RBC, HGB, HCT, PLT, MCV, MCH, MCHC, RDW, LYMPHSABS, MONOABS, EOSABS, BASOSABS  Chest Imaging: 06/12/2021 calcium scoring CT: 2.5 cm left lower lobe pulmonary nodule smooth edges. The patient's images have been independently reviewed by me.    Pulmonary Functions Testing Results: No flowsheet data found.  FeNO:   Pathology:   Echocardiogram:   Heart Catheterization:     Assessment & Plan:     ICD-10-CM   1. Lung nodule  R91.1 Ambulatory referral to Pulmonology    Procedural/ Surgical Case Request: ROBOTIC ASSISTED NAVIGATIONAL BRONCHOSCOPY    CT Super D Chest Wo Contrast    Pulmonary Function Test    2. Abnormal PET scan, lung  R94.2     3. Former smoker  Z87.891     4. Limited mobility  Z74.09        Discussion:  This is a 71 year old female, limited mobility, some balance issues, enlarging pulmonary nodule on the left lung,  abnormal PET scan of the left lung and former smoker.  Plan:  Today we talked about all of the various options to consider undergoing biopsy versus surgical resection of this lesion. She has not had pulmonary function test I do think that she needs those before consideration of surgery. I also think we need to consider a tissue diagnosis before considering surgery.  With her current mobility status I think recovery from surgery may be difficult. If we could even consider a lung sparing type procedure or segmentectomy for removal but I am not sure that this is even possible due to the proximity of the lesion to the hilum. I will have this reviewed by one of our thoracic surgeons. We will go ahead and set up for plans for bronchoscopy and tissue sampling.  Overall I do believe we are likely dealing with a low-grade malignancy such as a neuroendocrine carcinoma/carcinoid.    Current Outpatient Medications:    atorvastatin (LIPITOR) 80 MG tablet, Take 1 tablet (80 mg total) by mouth daily., Disp: 90 tablet, Rfl: 3   benazepril (LOTENSIN) 20 MG tablet, Take 20 mg by mouth daily. , Disp: , Rfl:    citalopram (CELEXA) 20 MG tablet, Take 20 mg by mouth daily. , Disp: , Rfl:    cyclobenzaprine (FLEXERIL) 5 MG tablet, Take 5 mg by mouth daily. , Disp: , Rfl:    cycloSPORINE (RESTASIS) 0.05 % ophthalmic emulsion, Place 1 drop into both eyes 2 (two) times daily. , Disp: , Rfl:    ergocalciferol (VITAMIN D2) 1.25 MG (50000 UT) capsule, Take 50,000 Units by mouth every 30 (thirty) days., Disp: , Rfl:    famotidine (PEPCID) 20 MG tablet, Take 20 mg by mouth at bedtime.,  Disp: , Rfl:    Flax Oil-Fish Oil-Borage Oil (FISH OIL-FLAX OIL-BORAGE OIL PO), Take 1 capsule by mouth daily. , Disp: , Rfl:    gabapentin (NEURONTIN) 300 MG capsule, Take 300 mg by mouth 3 (three) times daily. , Disp: , Rfl:    insulin detemir (LEVEMIR) 100 UNIT/ML injection, Inject 24-48 Units into the skin See admin instructions. Inject  48 units SQ in the morning and inject 24 units SQ in the evening, Disp: , Rfl:    omeprazole (PRILOSEC) 20 MG capsule, Take 20 mg by mouth daily., Disp: , Rfl:     Garner Nash, DO [facility] 08/02/2021 2:18 PM

## 2021-08-04 DIAGNOSIS — Z1212 Encounter for screening for malignant neoplasm of rectum: Secondary | ICD-10-CM | POA: Diagnosis not present

## 2021-08-04 DIAGNOSIS — Z1211 Encounter for screening for malignant neoplasm of colon: Secondary | ICD-10-CM | POA: Diagnosis not present

## 2021-08-10 DIAGNOSIS — E1122 Type 2 diabetes mellitus with diabetic chronic kidney disease: Secondary | ICD-10-CM | POA: Diagnosis not present

## 2021-08-10 DIAGNOSIS — E1121 Type 2 diabetes mellitus with diabetic nephropathy: Secondary | ICD-10-CM | POA: Diagnosis not present

## 2021-08-10 DIAGNOSIS — R911 Solitary pulmonary nodule: Secondary | ICD-10-CM | POA: Diagnosis not present

## 2021-08-22 ENCOUNTER — Other Ambulatory Visit: Payer: Self-pay

## 2021-08-22 ENCOUNTER — Encounter: Payer: Self-pay | Admitting: Cardiovascular Disease

## 2021-08-22 ENCOUNTER — Ambulatory Visit (INDEPENDENT_AMBULATORY_CARE_PROVIDER_SITE_OTHER): Payer: Medicare HMO | Admitting: Cardiovascular Disease

## 2021-08-22 DIAGNOSIS — R55 Syncope and collapse: Secondary | ICD-10-CM | POA: Diagnosis not present

## 2021-08-22 DIAGNOSIS — E782 Mixed hyperlipidemia: Secondary | ICD-10-CM | POA: Diagnosis not present

## 2021-08-22 DIAGNOSIS — R931 Abnormal findings on diagnostic imaging of heart and coronary circulation: Secondary | ICD-10-CM | POA: Diagnosis not present

## 2021-08-22 DIAGNOSIS — I10 Essential (primary) hypertension: Secondary | ICD-10-CM

## 2021-08-22 MED ORDER — BENAZEPRIL HCL 10 MG PO TABS: 10.0000 mg | ORAL_TABLET | Freq: Every day | ORAL | 3 refills | Status: DC

## 2021-08-22 MED ORDER — ATORVASTATIN CALCIUM 80 MG PO TABS: 80.0000 mg | ORAL_TABLET | Freq: Every day | ORAL | 3 refills | Status: DC

## 2021-08-22 NOTE — Assessment & Plan Note (Signed)
History of essential hypertension a blood pressure measured today at 100/76.  She does have a history of orthostatic hypotension.  She is on benazepril 20 mg a day.  I am going to decrease this to 10 mg a day I will have her keep a 30-day blood pressure log.  She will see a Pharm.D. back in 4 weeks to review make appropriate changes.

## 2021-08-22 NOTE — Patient Instructions (Signed)
Medication Instructions:   -Reduce benazepril (lotensin) to 10mg  once daily.  *If you need a refill on your cardiac medications before your next appointment, please call your pharmacy*   Testing/Procedures: Your physician has requested that you have an echocardiogram. Echocardiography is a painless test that uses sound waves to create images of your heart. It provides your doctor with information about the size and shape of your heart and how well your hearts chambers and valves are working. This procedure takes approximately one hour. There are no restrictions for this procedure. This procedure is done at [address]    Follow-Up: At [facility], you and your health needs are our priority.  As part of our continuing mission to provide you with exceptional heart care, we have created designated Provider Care Teams.  These Care Teams include your primary Cardiologist (physician) and Advanced Practice Providers (APPs -  Physician Assistants and Nurse Practitioners) who all work together to provide you with the care you need, when you need it.  We recommend signing up for the patient portal called "MyChart".  Sign up information is provided on this After Visit Summary.  MyChart is used to connect with patients for Virtual Visits (Telemedicine).  Patients are able to view lab/test results, encounter notes, upcoming appointments, etc.  Non-urgent messages can be sent to your provider as well.   To learn more about what you can do with MyChart, go to [email or web address].    Your next appointment:   3 month(s)  The format for your next appointment:   In Person  Provider:   Quay Burow, MD    Other Instructions Dr. [name] has requested that you schedule an appointment with one of our clinical pharmacists for a blood pressure check appointment within the next 4 weeks.   If you monitor your blood pressure (BP) at home, please bring your BP cuff and your BP readings with  you to this appointment  HOW TO TAKE YOUR BLOOD PRESSURE: Rest 5 minutes before taking your blood pressure. Dont smoke or drink caffeinated beverages for at least 30 minutes before. Take your blood pressure before (not after) you eat. Sit comfortably with your back supported and both feet on the floor (dont cross your legs). Elevate your arm to heart level on a table or a desk. Use the proper sized cuff. It should fit smoothly and snugly around your bare upper arm. There should be enough room to slip a fingertip under the cuff. The bottom edge of the cuff should be 1 inch above the crease of the elbow. Ideally, take 3 measurements at one sitting and record the average.

## 2021-08-22 NOTE — Assessment & Plan Note (Signed)
Ms. Alexandra Berger has multiple cardiac risk factors although she denies chest pain or shortness of breath.  She had a coronary calcium score performed 06/12/2021 which resulted at 158 the majority of which was in the LAD.  She is on high-dose statin therapy and is not at goal for secondary prevention.

## 2021-08-22 NOTE — Progress Notes (Signed)
08/22/2021 Alexandra Berger   Sep 05, 1950  [id number]  [facility] [place] [facility] [place] Primary Cardiologist: Alexandra Harp MD Alexandra Berger, [place]  HPI:  Alexandra Berger is a 71 y.o.  moderately overweight widowed Caucasian female mother of 2, grandmother of 4 grandchildren referred by Alexandra Congress, FNP for evaluation of orthostatic hypotension.  She is accompanied by her granddaughter Alexandra Berger today.  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  She smoked remotely having quit over 20 years ago.  She does have treated hypertension, diabetes and hyperlipidemia.  Both her parents had CABG.  She is never had a heart attack or stroke.  She denies chest pain or shortness of breath.  She recently moved from [place] to [place] to be closer to family.  Her son who has Down syndrome lives with her assess her grandson.  She is been more active since moving.  She currently does not drive.  She apparently recently had an episode of syncope as well.  Since I saw her 3 months ago she did have an event monitor that showed frequent PVCs, runs of NSVT as well as SVT.  A coronary calcium score was 158 most of which was in the LAD although she denies chest pain.  Her most recent lipid profile performed 03/22/2021 revealed total cholesterol of 207, LDL of 135 and HDL 53 on high-dose atorvastatin.     Current Meds  Medication Sig   atorvastatin (LIPITOR) 80 MG tablet Take 1 tablet (80 mg total) by mouth daily.   benazepril (LOTENSIN) 20 MG tablet Take 20 mg by mouth daily.    citalopram (CELEXA) 20 MG tablet Take 20 mg by mouth daily.    cyclobenzaprine (FLEXERIL) 5 MG tablet Take 5 mg by mouth daily.    cycloSPORINE (RESTASIS) 0.05 % ophthalmic emulsion Place 1 drop into both eyes 2 (two) times daily.    ergocalciferol (VITAMIN D2) 1.25 MG (50000 UT) capsule Take 50,000 Units by mouth every 30 (thirty) days.    famotidine (PEPCID) 20 MG tablet Take 20 mg by mouth at bedtime.   Flax Oil-Fish Oil-Borage Oil (FISH OIL-FLAX OIL-BORAGE OIL PO) Take 1 capsule by mouth daily.    gabapentin (NEURONTIN) 300 MG capsule Take 300 mg by mouth 3 (three) times daily.    insulin detemir (LEVEMIR) 100 UNIT/ML injection Inject 24-48 Units into the skin See admin instructions. Inject 48 units SQ in the morning and inject 24 units SQ in the evening   omeprazole (PRILOSEC) 20 MG capsule Take 20 mg by mouth daily.     Allergies  Allergen Reactions   Statins Other (See Comments)    dizziness    Social History   Socioeconomic History   Marital status: Widowed    Spouse name: Not on file   Number of children: Not on file   Years of education: Not on file   Highest education level: Not on file  Occupational History   Occupation: retired    Comment: Airline pilot  Tobacco Use   Smoking status: Former    Packs/day: 0.50    Years: 7.00    Pack years: 3.50    Types: Cigarettes    Quit date: 02/23/2002    Years since quitting: 19.5   Smokeless tobacco: Never  Vaping Use   Vaping Use: Never used  Substance and Sexual Activity   Alcohol use: No   Drug use: No  Sexual activity: Not on file  Other Topics Concern   Not on file  Social History Narrative   Not on file   Social Determinants of Health   Financial Resource Strain: Not on file  Food Insecurity: Not on file  Transportation Needs: Not on file  Physical Activity: Not on file  Stress: Not on file  Social Connections: Not on file  Intimate Partner Violence: Not on file     Review of Systems: General: negative for chills, fever, night sweats or weight changes.  Cardiovascular: negative for chest pain, dyspnea on exertion, edema, orthopnea, palpitations, paroxysmal nocturnal dyspnea or shortness of breath Dermatological: negative for rash Respiratory: negative for cough or wheezing Urologic: negative for hematuria Abdominal: negative for nausea,  vomiting, diarrhea, bright red blood per rectum, melena, or hematemesis Neurologic: negative for visual changes, syncope, or dizziness All other systems reviewed and are otherwise negative except as noted above.    Blood pressure 100/76, pulse 100, height 5\' 6"  (1.676 m), weight 201 lb 9.6 oz (91.4 kg), SpO2 98 %.  General appearance: alert and no distress Neck: no adenopathy, no carotid bruit, no JVD, supple, symmetrical, trachea midline, and thyroid not enlarged, symmetric, no tenderness/mass/nodules Lungs: clear to auscultation bilaterally Heart: regular rate and rhythm, S1, S2 normal, no murmur, click, rub or gallop Extremities: extremities normal, atraumatic, no cyanosis or edema Pulses: 2+ and symmetric Skin: Skin color, texture, turgor normal. No rashes or lesions Neurologic: Grossly normal  EKG not performed today  ASSESSMENT AND PLAN:   Essential hypertension History of essential hypertension a blood pressure measured today at 100/76.  She does have a history of orthostatic hypotension.  She is on benazepril 20 mg a day.  I am going to decrease this to 10 mg a day I will have her keep a 30-day blood pressure log.  She will see a Pharm.D. back in 4 weeks to review make appropriate changes.  Hyperlipidemia History of hyperlipidemia on high-dose statin therapy with recent lipid profile performed 03/14/2021 revealing total cholesterol 207, LDL 135 and HDL 53.  I am going to refer her to Dr. Lysbeth Berger lipid clinic for further evaluation and treatment.  Syncope and collapse History of syncope with collapse as well as episodes of dizziness with event monitor performed 05/28/2021 revealing frequent PVCs, short runs of nonsustained VT and runs of SVT.  I am going to refer her to EP to review and make further recommendations.  Elevated coronary artery calcium score Alexandra Berger has multiple cardiac risk factors although she denies chest pain or shortness of breath.  She had a coronary calcium  score performed 06/12/2021 which resulted at 158 the majority of which was in the LAD.  She is on high-dose statin therapy and is not at goal for secondary prevention.     Alexandra Harp MD FACP,FACC,FAHA, [facility] 08/22/2021 3:16 PM

## 2021-08-22 NOTE — Assessment & Plan Note (Signed)
History of hyperlipidemia on high-dose statin therapy with recent lipid profile performed 03/14/2021 revealing total cholesterol 207, LDL 135 and HDL 53.  I am going to refer her to Dr. Lysbeth Penner lipid clinic for further evaluation and treatment.

## 2021-08-22 NOTE — Assessment & Plan Note (Signed)
History of syncope with collapse as well as episodes of dizziness with event monitor performed 05/28/2021 revealing frequent PVCs, short runs of nonsustained VT and runs of SVT.  I am going to refer her to EP to review and make further recommendations.

## 2021-08-25 ENCOUNTER — Encounter (HOSPITAL_COMMUNITY): Payer: Self-pay | Admitting: Pulmonary Disease

## 2021-08-25 ENCOUNTER — Other Ambulatory Visit: Payer: Self-pay

## 2021-08-25 ENCOUNTER — Ambulatory Visit (INDEPENDENT_AMBULATORY_CARE_PROVIDER_SITE_OTHER)
Admission: RE | Admit: 2021-08-25 | Discharge: 2021-08-25 | Disposition: A | Discharge status: Home / Self Care | Payer: Medicare HMO | Source: Ambulatory Visit | Attending: Pulmonary Disease | Admitting: Pulmonary Disease

## 2021-08-25 DIAGNOSIS — R911 Solitary pulmonary nodule: Secondary | ICD-10-CM

## 2021-08-25 DIAGNOSIS — R918 Other nonspecific abnormal finding of lung field: Secondary | ICD-10-CM | POA: Diagnosis not present

## 2021-08-25 NOTE — Progress Notes (Signed)
DUE TO COVID-19 ONLY ONE VISITOR IS ALLOWED TO COME WITH YOU AND STAY IN THE WAITING ROOM ONLY DURING PRE OP AND PROCEDURE DAY OF SURGERY.  ? ? ?PCP - Faustino Congress FNP ?Cardiologist - Quay Burow MD ? ? ?EKG - 05/23/21 ? ?Do not take oral diabetes medicines (pills) the morning of surgery. ? ?THE NIGHT BEFORE SURGERY, take _6_ units of _levemir_ insulin.     ?THE MORNING OF SURGERY, take _12_ units of _levemir__ insulin. ? ?The day of surgery, do not take other diabetes injectables, including Byetta (exenatide), Bydureon (exenatide ER), Victoza (liraglutide), or Trulicity (dulaglutide). ? ?If your CBG is greater than 220 mg/dL, you may take ? of your sliding scale (correction) dose of insulin. ? ?If your blood sugar is less than 70 mg/dL, you will need to treat for low blood sugar: ?Do not take insulin. ?Treat a low blood sugar (less than 70 mg/dL) with ? cup of clear juice (cranberry or apple), 4 glucose tablets, OR glucose gel. ?Recheck blood sugar in 15 minutes after treatment (to make sure it is greater than 70 mg/dL). If your blood sugar is not greater than 70 mg/dL on recheck, call [phone] for further instructions. ? ?Anesthesia review: yes ? ?STOP now taking any Aspirin (unless otherwise instructed by your surgeon), Aleve, Naproxen, Ibuprofen, Motrin, Advil, Goody's, BC's, all herbal medications, fish oil, and all vitamins.  ? ?Coronavirus Screening ?Covid test is scheduled on  ?Do you have any of the following symptoms:  ?Cough yes/no: No ?Fever (>100.46F)  yes/no: No ?Runny nose yes/no: No ?Sore throat yes/no: No ?Difficulty breathing/shortness of breath  yes/no: No ? ?Have you traveled in the last 14 days and where? yes/no: No ? ?Patient verbalized understanding of instructions that were given to them at the PAT appointment. Patient was also instructed that they will need to review over the PAT instructions again at home before surgery. ? ?

## 2021-08-29 ENCOUNTER — Ambulatory Visit (HOSPITAL_COMMUNITY): Payer: Medicare HMO

## 2021-08-29 ENCOUNTER — Ambulatory Visit (HOSPITAL_BASED_OUTPATIENT_CLINIC_OR_DEPARTMENT_OTHER): Payer: Medicare HMO | Admitting: Anesthesiology

## 2021-08-29 ENCOUNTER — Ambulatory Visit (HOSPITAL_COMMUNITY): Payer: Medicare HMO | Admitting: Anesthesiology

## 2021-08-29 ENCOUNTER — Other Ambulatory Visit: Payer: Self-pay

## 2021-08-29 ENCOUNTER — Encounter (HOSPITAL_COMMUNITY): Payer: Self-pay | Admitting: Pulmonary Disease

## 2021-08-29 ENCOUNTER — Ambulatory Visit (HOSPITAL_COMMUNITY)
Admission: RE | Admit: 2021-08-29 | Discharge: 2021-08-29 | Disposition: A | Discharge status: Home / Self Care | Payer: Medicare HMO | Attending: Pulmonary Disease | Admitting: Pulmonary Disease

## 2021-08-29 ENCOUNTER — Encounter (HOSPITAL_COMMUNITY)
Admission: RE | Disposition: A | Discharge status: Home / Self Care | Payer: Self-pay | Source: Home / Self Care | Attending: Pulmonary Disease

## 2021-08-29 DIAGNOSIS — C7A8 Other malignant neuroendocrine tumors: Secondary | ICD-10-CM | POA: Insufficient documentation

## 2021-08-29 DIAGNOSIS — Z20822 Contact with and (suspected) exposure to covid-19: Secondary | ICD-10-CM | POA: Insufficient documentation

## 2021-08-29 DIAGNOSIS — Z87891 Personal history of nicotine dependence: Secondary | ICD-10-CM | POA: Insufficient documentation

## 2021-08-29 DIAGNOSIS — E119 Type 2 diabetes mellitus without complications: Secondary | ICD-10-CM | POA: Diagnosis not present

## 2021-08-29 DIAGNOSIS — D3A8 Other benign neuroendocrine tumors: Secondary | ICD-10-CM | POA: Diagnosis not present

## 2021-08-29 DIAGNOSIS — Z7409 Other reduced mobility: Secondary | ICD-10-CM | POA: Diagnosis not present

## 2021-08-29 DIAGNOSIS — Z419 Encounter for procedure for purposes other than remedying health state, unspecified: Secondary | ICD-10-CM

## 2021-08-29 DIAGNOSIS — R918 Other nonspecific abnormal finding of lung field: Secondary | ICD-10-CM | POA: Diagnosis not present

## 2021-08-29 DIAGNOSIS — I10 Essential (primary) hypertension: Secondary | ICD-10-CM | POA: Insufficient documentation

## 2021-08-29 DIAGNOSIS — C3432 Malignant neoplasm of lower lobe, left bronchus or lung: Secondary | ICD-10-CM | POA: Insufficient documentation

## 2021-08-29 DIAGNOSIS — R942 Abnormal results of pulmonary function studies: Secondary | ICD-10-CM | POA: Diagnosis not present

## 2021-08-29 DIAGNOSIS — R911 Solitary pulmonary nodule: Secondary | ICD-10-CM | POA: Diagnosis not present

## 2021-08-29 DIAGNOSIS — Z9889 Other specified postprocedural states: Secondary | ICD-10-CM

## 2021-08-29 HISTORY — DX: Ventricular premature depolarization: I49.3

## 2021-08-29 HISTORY — DX: Hyperlipidemia, unspecified: E78.5

## 2021-08-29 HISTORY — PX: BRONCHIAL BIOPSY: SHX5109

## 2021-08-29 HISTORY — PX: BRONCHIAL NEEDLE ASPIRATION BIOPSY: SHX5106

## 2021-08-29 HISTORY — PX: VIDEO BRONCHOSCOPY WITH RADIAL ENDOBRONCHIAL ULTRASOUND: SHX6849

## 2021-08-29 HISTORY — DX: Other amnesia: R41.3

## 2021-08-29 HISTORY — PX: BRONCHIAL BRUSHINGS: SHX5108

## 2021-08-29 LAB — POCT I-STAT, CHEM 8
BUN: 23 mg/dL (ref 8–23)
Calcium, Ion: 1.16 mmol/L (ref 1.15–1.40)
Chloride: 103 mmol/L (ref 98–111)
Creatinine, Ser: 1.2 mg/dL — ABNORMAL HIGH (ref 0.44–1.00)
Glucose, Bld: 79 mg/dL (ref 70–99)
HCT: 42 % (ref 36.0–46.0)
Hemoglobin: 14.3 g/dL (ref 12.0–15.0)
Potassium: 3.8 mmol/L (ref 3.5–5.1)
Sodium: 142 mmol/L (ref 135–145)
TCO2: 30 mmol/L (ref 22–32)

## 2021-08-29 LAB — BASIC METABOLIC PANEL
Anion gap: 12 (ref 5–15)
BUN: 24 mg/dL — ABNORMAL HIGH (ref 8–23)
CO2: 27 mmol/L (ref 22–32)
Calcium: 9.2 mg/dL (ref 8.9–10.3)
Chloride: 100 mmol/L (ref 98–111)
Creatinine, Ser: 1.21 mg/dL — ABNORMAL HIGH (ref 0.44–1.00)
GFR, Estimated: 48 mL/min — ABNORMAL LOW (ref >60)
Glucose, Bld: 82 mg/dL (ref 70–99)
Potassium: 6.2 mmol/L — ABNORMAL HIGH (ref 3.5–5.1)
Sodium: 139 mmol/L (ref 135–145)

## 2021-08-29 LAB — GLUCOSE, CAPILLARY
Glucose-Capillary: 98 mg/dL (ref 70–99)
Glucose-Capillary: 79 mg/dL (ref 70–99)
Glucose-Capillary: 81 mg/dL (ref 70–99)
Glucose-Capillary: 75 mg/dL (ref 70–99)

## 2021-08-29 LAB — CBC
HCT: 43.9 % (ref 36.0–46.0)
Hemoglobin: 14.9 g/dL (ref 12.0–15.0)
MCH: 32.1 pg (ref 26.0–34.0)
MCHC: 33.9 g/dL (ref 30.0–36.0)
MCV: 94.6 fL (ref 80.0–100.0)
Platelets: 270 10*3/uL (ref 150–400)
RBC: 4.64 MIL/uL (ref 3.87–5.11)
RDW: 13.3 % (ref 11.5–15.5)
WBC: 5 10*3/uL (ref 4.0–10.5)
nRBC: 0 % (ref 0.0–0.2)

## 2021-08-29 LAB — SARS CORONAVIRUS 2 BY RT PCR (HOSPITAL ORDER, PERFORMED IN ~~LOC~~ HOSPITAL LAB): SARS Coronavirus 2: NEGATIVE

## 2021-08-29 SURGERY — BRONCHOSCOPY, WITH BIOPSY USING ELECTROMAGNETIC NAVIGATION
Anesthesia: General | Laterality: Left

## 2021-08-29 MED ORDER — ONDANSETRON HCL 4 MG/2ML IJ SOLN
INTRAMUSCULAR | Status: DC | PRN
  Administered 2021-08-29: 4 mg via INTRAVENOUS

## 2021-08-29 MED ORDER — PHENYLEPHRINE HCL-NACL 20-0.9 MG/250ML-% IV SOLN
INTRAVENOUS | Status: DC | PRN
  Administered 2021-08-29: 30 ug/min via INTRAVENOUS

## 2021-08-29 MED ORDER — FENTANYL CITRATE (PF) 250 MCG/5ML IJ SOLN
INTRAMUSCULAR | Status: DC | PRN
  Administered 2021-08-29 (×2): 50 ug via INTRAVENOUS

## 2021-08-29 MED ORDER — DEXAMETHASONE SODIUM PHOSPHATE 10 MG/ML IJ SOLN
INTRAMUSCULAR | Status: DC | PRN
  Administered 2021-08-29: 10 mg via INTRAVENOUS

## 2021-08-29 MED ORDER — SUGAMMADEX SODIUM 200 MG/2ML IV SOLN: INTRAVENOUS | Status: DC | PRN

## 2021-08-29 MED ORDER — ACETAMINOPHEN 500 MG PO TABS
ORAL_TABLET | ORAL | Status: AC
  Administered 2021-08-29: 1000 mg via ORAL
  Filled 2021-08-29: qty 2

## 2021-08-29 MED ORDER — AMISULPRIDE (ANTIEMETIC) 5 MG/2ML IV SOLN
10.0000 mg | Freq: Once | INTRAVENOUS | Status: DC | PRN
  Filled 2021-08-29: qty 4

## 2021-08-29 MED ORDER — SUGAMMADEX SODIUM 200 MG/2ML IV SOLN
INTRAVENOUS | Status: DC | PRN
  Administered 2021-08-29: 200 mg via INTRAVENOUS

## 2021-08-29 MED ORDER — ACETAMINOPHEN 500 MG PO TABS: 1000.0000 mg | ORAL_TABLET | Freq: Once | ORAL | Status: AC

## 2021-08-29 MED ORDER — ROCURONIUM BROMIDE 10 MG/ML (PF) SYRINGE
PREFILLED_SYRINGE | INTRAVENOUS | Status: DC | PRN
  Administered 2021-08-29: 50 mg via INTRAVENOUS

## 2021-08-29 MED ORDER — LACTATED RINGERS IV SOLN: INTRAVENOUS | Status: DC

## 2021-08-29 MED ORDER — LIDOCAINE 2% (20 MG/ML) 5 ML SYRINGE
INTRAMUSCULAR | Status: DC | PRN
  Administered 2021-08-29: 60 mg via INTRAVENOUS

## 2021-08-29 MED ORDER — CHLORHEXIDINE GLUCONATE 0.12 % MT SOLN
OROMUCOSAL | Status: AC
  Administered 2021-08-29: 15 mL
  Filled 2021-08-29: qty 15

## 2021-08-29 MED ORDER — INSULIN ASPART 100 UNIT/ML IJ SOLN: 0.0000 [IU] | INTRAMUSCULAR | Status: DC | PRN

## 2021-08-29 MED ORDER — FENTANYL CITRATE (PF) 100 MCG/2ML IJ SOLN: 25.0000 ug | INTRAMUSCULAR | Status: DC | PRN

## 2021-08-29 MED ORDER — PROPOFOL 10 MG/ML IV BOLUS
INTRAVENOUS | Status: DC | PRN
Start: 2021-08-29 — End: 2021-08-29
  Administered 2021-08-29: 150 mg via INTRAVENOUS

## 2021-08-29 NOTE — Anesthesia Preprocedure Evaluation (Addendum)
Anesthesia Evaluation  ?Patient identified by MRN, date of birth, ID band ?Patient awake ? ? ? ?Reviewed: ?Allergy & Precautions, NPO status , Patient's Chart, lab work & pertinent test results ? ?Airway ?Mallampati: II ? ?TM Distance: >3 FB ?Neck ROM: Full ? ? ? Dental ? ?(+) Dental Advisory Given, Missing, Loose, Poor Dentition ?  ?Pulmonary ?former smoker,  ?  ?breath sounds clear to auscultation ? ? ? ? ? ? Cardiovascular ?hypertension, Pt. on medications ?+ CAD  ? ?Rhythm:Regular Rate:Normal ? ? ?  ?Neuro/Psych ?negative neurological ROS ?   ? GI/Hepatic ?negative GI ROS, Neg liver ROS,   ?Endo/Other  ?diabetes, Type 2, Insulin Dependent ? Renal/GU ?negative Renal ROS  ? ?  ?Musculoskeletal ? ?(+) Arthritis ,  ? Abdominal ?  ?Peds ? Hematology ?negative hematology ROS ?(+)   ?Anesthesia Other Findings ? ? Reproductive/Obstetrics ? ?  ? ? ? ? ? ? ? ? ? ? ? ? ? ?  ?  ? ? ? ? ? ? ? ?Anesthesia Physical ?Anesthesia Plan ? ?ASA: 2 ? ?Anesthesia Plan: General  ? ?Post-op Pain Management: Tylenol PO (pre-op)* and Minimal or no pain anticipated  ? ?Induction: Intravenous ? ?PONV Risk Score and Plan: 3 and Dexamethasone, Ondansetron and Treatment may vary due to age or medical condition ? ?Airway Management Planned: Oral ETT ? ?Additional Equipment: None ? ?Intra-op Plan:  ? ?Post-operative Plan: Extubation in OR ? ?Informed Consent: I have reviewed the patients History and Physical, chart, labs and discussed the procedure including the risks, benefits and alternatives for the proposed anesthesia with the patient or authorized representative who has indicated his/her understanding and acceptance.  ? ? ? ?Dental advisory given ? ?Plan Discussed with: CRNA ? ?Anesthesia Plan Comments:   ? ? ? ? ? ? ?Anesthesia Quick Evaluation ? ?

## 2021-08-29 NOTE — Anesthesia Postprocedure Evaluation (Signed)
Anesthesia Post Note ? ?Patient: Alexandra Berger ? ?Procedure(s) Performed: ROBOTIC ASSISTED NAVIGATIONAL BRONCHOSCOPY (Left) ?RADIAL ENDOBRONCHIAL ULTRASOUND ?BRONCHIAL BIOPSIES ?BRONCHIAL NEEDLE ASPIRATION BIOPSIES ?BRONCHIAL BRUSHINGS ? ?  ? ?Patient location during evaluation: PACU ?Anesthesia Type: General ?Level of consciousness: awake and alert ?Pain management: pain level controlled ?Vital Signs Assessment: post-procedure vital signs reviewed and stable ?Respiratory status: spontaneous breathing, nonlabored ventilation, respiratory function stable and patient connected to nasal cannula oxygen ?Cardiovascular status: blood pressure returned to baseline and stable ?Postop Assessment: no apparent nausea or vomiting ?Anesthetic complications: no ? ? ?No notable events documented. ? ?Last Vitals:  ?Vitals:  ? 08/29/21 1055 08/29/21 1110  ?BP: (!) 107/47 (!) 100/50  ?Pulse: 72 69  ?Resp: 18 13  ?Temp:  36.7 ?C  ?SpO2: 99% 93%  ?  ?Last Pain:  ?Vitals:  ? 08/29/21 1110  ?PainSc: 0-No pain  ? ? ?  ?  ?  ?  ?  ?  ? ?Suzette Battiest E ? ? ? ? ?

## 2021-08-29 NOTE — Anesthesia Procedure Notes (Signed)
Procedure Name: Intubation ?Date/Time: 08/29/2021 9:41 AM ?Performed by: Annamary Carolin, CRNA ?Pre-anesthesia Checklist: Patient identified, Emergency Drugs available, Suction available and Patient being monitored ?Patient Re-evaluated:Patient Re-evaluated prior to induction ?Oxygen Delivery Method: Circle System Utilized ?Preoxygenation: Pre-oxygenation with 100% oxygen ?Induction Type: IV induction ?Ventilation: Mask ventilation without difficulty ?Laryngoscope Size: 3 and Mac ?Grade View: Grade I ?Tube type: Oral ?Tube size: 8.5 mm ?Number of attempts: 1 ?Airway Equipment and Method: Stylet ?Placement Confirmation: ETT inserted through vocal cords under direct vision, positive ETCO2 and breath sounds checked- equal and bilateral ?Secured at: 22 cm ?Tube secured with: Tape ?Dental Injury: Teeth and Oropharynx as per pre-operative assessment  ?Comments: Teeth and gums extremely friable and bleed to the touch with little to no manipulation. All teeth remain as preop, but very poor dentition throughout. Bronch team made aware of teeth instability as well. ? ? ? ? ?

## 2021-08-29 NOTE — Progress Notes (Signed)
Potassium 6.2 on BMP from this morning. Dr. Deatra Canter made aware. Verbal order received to draw an ISTAT. ? ?

## 2021-08-29 NOTE — Interval H&P Note (Signed)
History and Physical Interval Note: ? ?08/29/2021 ?9:24 AM ? ?Alexandra Berger  has presented today for surgery, with the diagnosis of Lung nodule.  The various methods of treatment have been discussed with the patient and family. After consideration of risks, benefits and other options for treatment, the patient has consented to  Procedure(s) with comments: ?ROBOTIC ASSISTED NAVIGATIONAL BRONCHOSCOPY (Left) - ION w/ CIOS as a surgical intervention.  The patient's history has been reviewed, patient examined, no change in status, stable for surgery.  I have reviewed the patient's chart and labs.  Questions were answered to the patient's satisfaction.   ? ? ?Octavio Graves Alexandra Berger ? ? ?

## 2021-08-29 NOTE — Op Note (Signed)
Video Bronchoscopy with Robotic Assisted Bronchoscopic Navigation  ? ?Date of Operation: 08/29/2021  ? ?Pre-op Diagnosis: Left lower lobe ? ?Post-op Diagnosis: Left lower lobe ? ?Surgeon: Garner Nash, DO  ? ?Assistants: None  ? ?Anesthesia: General endotracheal anesthesia ? ?Operation: Flexible video fiberoptic bronchoscopy with robotic assistance and biopsies. ? ?Estimated Blood Loss: Minimal ? ?Complications: None ? ?Indications and History: ?Alexandra Berger is a 71 y.o. female with history of left lower lobe pulmonary nodule. The risks, benefits, complications, treatment options and expected outcomes were discussed with the patient.  The possibilities of pneumothorax, pneumonia, reaction to medication, pulmonary aspiration, perforation of a viscus, bleeding, failure to diagnose a condition and creating a complication requiring transfusion or operation were discussed with the patient who freely signed the consent.   ? ?Description of Procedure: ?The patient was seen in the Preoperative Area, was examined and was deemed appropriate to proceed.  The patient was taken to [facility] endoscopy room 3, identified as Alexandra Berger and the procedure verified as Flexible Video Fiberoptic Bronchoscopy.  A Time Out was held and the above information confirmed.  ? ?Prior to the date of the procedure a high-resolution CT scan of the chest was performed. Utilizing ION software program a virtual tracheobronchial tree was generated to allow the creation of distinct navigation pathways to the patient's parenchymal abnormalities. After being taken to the operating room general anesthesia was initiated and the patient  was orally intubated. The video fiberoptic bronchoscope was introduced via the endotracheal tube and a general inspection was performed which showed normal right and left lung anatomy, aspiration of the bilateral mainstems was completed to remove any remaining secretions. Robotic catheter inserted into patient's  endotracheal tube.  ? ?Target #1 left lower lobe: ?The distinct navigation pathways prepared prior to this procedure were then utilized to navigate to patient's lesion identified on CT scan. The robotic catheter was secured into place and the vision probe was withdrawn.  Lesion location was approximated using fluoroscopy and radial endobronchial ultrasound for peripheral targeting. Under fluoroscopic guidance transbronchial needle brushings, transbronchial needle biopsies, and transbronchial forceps biopsies were performed to be sent for cytology and pathology. ? ?At the end of the procedure a general airway inspection was performed and there was no evidence of active bleeding. The bronchoscope was removed.  The patient tolerated the procedure well. There was no significant blood loss and there were no obvious complications. A post-procedural chest x-ray is pending. ? ?Samples Target #1: ?1. Transbronchial needle brushings from LUL ?2. Transbronchial Wang needle biopsies from LUL ?3. Transbronchial forceps biopsies from LUL ? ?Plans:  ?The patient will be discharged from the PACU to home when recovered from anesthesia and after chest x-ray is reviewed. We will review the cytology, pathology results with the patient when they become available. Outpatient followup will be with Octavio Graves Kymberly Blomberg, DO.  ? ?Garner Nash, DO ?[facility] ?08/29/2021 10:43 AM   ? ?

## 2021-08-29 NOTE — Discharge Instructions (Signed)
Flexible Bronchoscopy, Care After ?This sheet gives you information about how to care for yourself after your test. Your doctor may also give you more specific instructions. If you have problems or questions, contact your doctor. ?Follow these instructions at home: ?Eating and drinking ?Do not eat or drink anything (not even water) for 2 hours after your test, or until your numbing medicine (local anesthetic) wears off. ?When your numbness is gone and your cough and gag reflexes have come back, you may: ?Eat only soft foods. ?Slowly drink liquids. ?The day after the test, go back to your normal diet. ?Driving ?Do not drive for 24 hours if you were given a medicine to help you relax (sedative). ?Do not drive or use heavy machinery while taking prescription pain medicine. ?General instructions ? ?Take over-the-counter and prescription medicines only as told by your doctor. ?Return to your normal activities as told. Ask what activities are safe for you. ?Do not use any products that have nicotine or tobacco in them. This includes cigarettes and e-cigarettes. If you need help quitting, ask your doctor. ?Keep all follow-up visits as told by your doctor. This is important. It is very important if you had a tissue sample (biopsy) taken. ?Get help right away if: ?You have shortness of breath that gets worse. ?You get light-headed. ?You feel like you are going to pass out (faint). ?You have chest pain. ?You cough up: ?More than a little blood. ?More blood than before. ?Summary ?Do not eat or drink anything (not even water) for 2 hours after your test, or until your numbing medicine wears off. ?Do not use cigarettes. Do not use e-cigarettes. ?Get help right away if you have chest pain. ? ?This information is not intended to replace advice given to you by your health care provider. Make sure you discuss any questions you have with your health care provider. ?Document Released: 04/08/2009 Document Revised: 05/24/2017 Document  Reviewed: 06/29/2016 ?Elsevier Patient Education ? [place]. ? ?

## 2021-08-29 NOTE — Transfer of Care (Signed)
Immediate Anesthesia Transfer of Care Note ? ?Patient: Anneke Cundy ? ?Procedure(s) Performed: ROBOTIC ASSISTED NAVIGATIONAL BRONCHOSCOPY (Left) ?RADIAL ENDOBRONCHIAL ULTRASOUND ?BRONCHIAL BIOPSIES ?BRONCHIAL NEEDLE ASPIRATION BIOPSIES ?BRONCHIAL BRUSHINGS ? ?Patient Location: PACU ? ?Anesthesia Type:General ? ?Level of Consciousness: awake, alert , oriented and patient cooperative ? ?Airway & Oxygen Therapy: Patient Spontanous Breathing and Patient connected to face mask oxygen ? ?Post-op Assessment: Report given to RN, Post -op Vital signs reviewed and stable and Patient moving all extremities X 4 ? ?Post vital signs: Reviewed and stable ? ?Last Vitals:  ?Vitals Value Taken Time  ?BP    ?Temp    ?Pulse    ?Resp    ?SpO2    ? ? ?Last Pain:  ?Vitals:  ? 08/29/21 0703  ?PainSc: 0-No pain  ?   ? ?Patients Stated Pain Goal: 0 (08/29/21 0703) ? ?Complications: No notable events documented. ?

## 2021-08-31 ENCOUNTER — Encounter (HOSPITAL_COMMUNITY): Payer: Self-pay | Admitting: Pulmonary Disease

## 2021-08-31 LAB — CYTOLOGY - NON PAP

## 2021-09-01 ENCOUNTER — Other Ambulatory Visit (HOSPITAL_COMMUNITY): Payer: Medicare HMO

## 2021-09-08 ENCOUNTER — Ambulatory Visit: Payer: Medicare HMO | Admitting: Acute Care

## 2021-09-08 ENCOUNTER — Telehealth: Payer: Self-pay | Admitting: Internal Medicine

## 2021-09-08 ENCOUNTER — Other Ambulatory Visit: Payer: Self-pay

## 2021-09-08 ENCOUNTER — Telehealth: Payer: Self-pay | Admitting: Radiation Oncology

## 2021-09-08 ENCOUNTER — Encounter: Payer: Self-pay | Admitting: Acute Care

## 2021-09-08 ENCOUNTER — Encounter: Payer: Self-pay | Admitting: *Deleted

## 2021-09-08 ENCOUNTER — Telehealth: Payer: Self-pay | Admitting: Acute Care

## 2021-09-08 VITALS — BP 100/66 | HR 99 | Temp 98.9°F | Ht 65.0 in | Wt 201.4 lb

## 2021-09-08 DIAGNOSIS — D3A8 Other benign neuroendocrine tumors: Secondary | ICD-10-CM | POA: Diagnosis not present

## 2021-09-08 DIAGNOSIS — R911 Solitary pulmonary nodule: Secondary | ICD-10-CM

## 2021-09-08 DIAGNOSIS — F03B0 Unspecified dementia, moderate, without behavioral disturbance, psychotic disturbance, mood disturbance, and anxiety: Secondary | ICD-10-CM

## 2021-09-08 DIAGNOSIS — Z87891 Personal history of nicotine dependence: Secondary | ICD-10-CM

## 2021-09-08 DIAGNOSIS — R69 Illness, unspecified: Secondary | ICD-10-CM | POA: Diagnosis not present

## 2021-09-08 NOTE — Patient Instructions (Addendum)
It is good to see you today. ?We will get PFT's scheduled as soon as possible ?Please schedule as soon as possible. ?You will get a call to make these appointments.  ?I will place a  referral to Thoracic surgery, Medical oncology and Radiation oncology.  ?You will get a call to get appointments scheduled.  ?Follow up with Dr. [name] as needed.  ?Please contact office for sooner follow up if symptoms do not improve or worsen or seek emergency care   ? ? ? ?

## 2021-09-08 NOTE — Telephone Encounter (Signed)
Called patient to schedule a consultation w. Dr. Lisbeth Renshaw. Unable to LVM, phone line busy.  ?

## 2021-09-08 NOTE — Telephone Encounter (Signed)
For all scheduling please call daughter Beverlee Nims at [phone]. She will make all appointments.  ?

## 2021-09-08 NOTE — Progress Notes (Signed)
? ?History of Present Illness ?Alexandra Berger is a 71 y.o. female with incidental of finding of 2.5 cm left lower lobe pulmonary nodule on a coronary calcium CT Chest. She was referred to Dr. [name] for evaluation and follow up of  this lung nodule. She underwent  Flexible video fiberoptic bronchoscopy with robotic assistance and biopsies.on 08/29/2021. She is here for follow up.  ? ?09/08/2021 ?Pt. Presents for follow up after Flexible video fiberoptic bronchoscopy with robotic assistance and biopsies.on 08/29/2021. 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 I explained that this is a lung cancer that develops from abnormal growth of cells of the neuroendocrine system. We discussed options for treatment include radiation, surgery and chemotherapy. She is in agreement to referrals to  surgery, radiation and medical oncology. The family want to know more about all potential options for treatment. She states she has been doing well since the biopsies. No fever,  bleeding or sore throat.Her breathing is at her baseline.  She is a former smoker , quit 2003 . She is anxious to get plans set for treatment.  ? ?Pt. Does have some dementia. She had trouble recalling information we discussed today in the office. Her daughter and son in law were with her today, and all office visits will  be scheduled by her daughter Alexandra Berger. Her contact information is [phone].Patient states she is in agreement with her daughter planning visits, as she no longer drives.  ? ?Surgical options will be based on PFT results and thoracic surgery evaluation of the tumor, due to proximity of the hilum . I have referred to radiation and medical oncology in addition to surgery.  ? ?She has had unintentional weight loss and per her family she does not eat much. I wonder if this is related to her dementia and forgetting to eat vs the finding of neuroendocrine tumor. Between 08/03/2021 and 09/08/2021 she has had  an additional weight loss of 3 pounds.  ? ?  ? ?Test Results: ?Cytology 09/11/2021 ?LUNG, LLL, FINE NEEDLE ASPIRATION:  ?- Neuroendocrine neoplasm, see comment  ? ?B. LUNG, LLL, BRUSHING:  ?- Neuroendocrine neoplasm, see comment  ? ?CBC Latest Ref Rng & Units 08/29/2021 08/29/2021  ?WBC 4.0 - 10.5 K/uL - 5.0  ?Hemoglobin 12.0 - 15.0 g/dL 14.3 14.9  ?Hematocrit 36.0 - 46.0 % 42.0 43.9  ?Platelets 150 - 400 K/uL - 270  ? ? ?BMP Latest Ref Rng & Units 08/29/2021 08/29/2021 08/14/2018  ?Glucose 70 - 99 mg/dL 79 82 200(H)  ?BUN 8 - 23 mg/dL 23 24(H) 21  ?Creatinine 0.44 - 1.00 mg/dL 1.20(H) 1.21(H) 1.05(H)  ?Sodium 135 - 145 mmol/L 142 139 138  ?Potassium 3.5 - 5.1 mmol/L 3.8 6.2(H) 4.7  ?Chloride 98 - 111 mmol/L 103 100 103  ?CO2 22 - 32 mmol/L - 27 26  ?Calcium 8.9 - 10.3 mg/dL - 9.2 9.0  ? ? ?BNP ?No results found for: BNP ? ?ProBNP ?No results found for: PROBNP ? ?PFT ?No results found for: FEV1PRE, FEV1POST, FVCPRE, FVCPOST, TLC, DLCOUNC, PREFEV1FVCRT, PSTFEV1FVCRT ? ?DG Chest Port 1 View ? ?Result Date: 08/29/2021 ?CLINICAL DATA:  Status post bronchoscopy with biopsy. EXAM: PORTABLE CHEST 1 VIEW COMPARISON:  Chest CT 08/25/2021. FINDINGS: Heart size within normal limits. Known 2.4 cm nodule with surrounding ground-glass opacity within the left lower lobe, better delineated on the recent prior chest CT of 08/25/2021. No appreciable airspace consolidation within the right lung. No evidence of pleural effusion or pneumothorax.  No acute bony abnormality identified. IMPRESSION: No evidence of acute cardiopulmonary abnormality status post bronchoscopy. Known 2.4 cm nodule with surrounding ground-glass opacity in the left lower lobe, better delineated on the recent prior chest CT of 08/25/2021. Electronically Signed   By: Kellie Simmering D.O.   On: 08/29/2021 11:16  ? ?CT Super D Chest Wo Contrast ? ?Result Date: 08/26/2021 ?CLINICAL DATA:  Left lower lobe pulmonary nodule. Pre bronchoscopy planning. EXAM: CT CHEST WITHOUT CONTRAST  TECHNIQUE: Multidetector CT imaging of the chest was performed using thin slice collimation for electromagnetic bronchoscopy planning purposes, without intravenous contrast. RADIATION DOSE REDUCTION: This exam was performed according to the departmental dose-optimization program which includes automated exposure control, adjustment of the mA and/or kV according to patient size and/or use of iterative reconstruction technique. COMPARISON:  Cardiac CT 06/12/2021.  PET-CT 07/06/2021. FINDINGS: Cardiovascular: Atherosclerosis of the aorta, great vessels and coronary arteries again noted. There is central enlargement of the pulmonary arteries suspicious for pulmonary arterial hypertension. No acute vascular findings are seen on noncontrast imaging. The heart size is normal. A small amount of pericardial fluid appears unchanged. Mediastinum/Nodes: There are no enlarged mediastinal, hilar or axillary lymph nodes.Hilar assessment is limited by the lack of intravenous contrast, although the hilar contours appear unchanged. Moderate size hiatal hernia appears unchanged. The thyroid gland and trachea appear unremarkable. Lungs/Pleura: No pleural effusion or pneumothorax. Well-circumscribed noncalcified nodule anteriorly in the left lower lobe measures 2.4 x 1.6 cm on image 105/3, similar to previous studies. The adjacent ground-glass opacities on the PET-CT has improved. No new or enlarging pulmonary nodules are identified. Upper abdomen: The visualized upper abdomen appears stable without significant findings. Musculoskeletal/Chest wall: There is no chest wall mass or suspicious osseous finding. Mild thoracic spondylosis. IMPRESSION: 1. Imaging for bronchoscopic planning and guidance. 2. The known well-circumscribed nodule anteriorly in the left lower lobe has not significantly changed from previous CTs. Surrounding ground-glass opacity on the PET-CT has improved. No new or enlarging pulmonary nodules are identified. 3. No  adenopathy or pleural effusion.  Moderate size hiatal hernia. 4. Coronary and Aortic Atherosclerosis (ICD10-I70.0). Electronically Signed   By: Richardean Sale M.D.   On: 08/26/2021 10:46  ? ?DG C-ARM BRONCHOSCOPY ? ?Result Date: 08/29/2021 ?C-ARM BRONCHOSCOPY: Fluoroscopy was utilized by the requesting physician.  No radiographic interpretation.   ? ? ?Past medical hx ?Past Medical History:  ?Diagnosis Date  ? Anxiety and depression   ? Chronic back pain   ? Diabetes mellitus without complication ([place])   ? DJD (degenerative joint disease)   ? Frequent PVCs   ? Hyperlipidemia   ? Hypertension   ? Memory difficulties   ?  ? ?Social History  ? ?Tobacco Use  ? Smoking status: Former  ?  Packs/day: 0.50  ?  Years: 7.00  ?  Pack years: 3.50  ?  Types: Cigarettes  ?  Quit date: 02/23/2002  ?  Years since quitting: 19.5  ? Smokeless tobacco: Never  ?Vaping Use  ? Vaping Use: Never used  ?Substance Use Topics  ? Alcohol use: Yes  ?  Comment: wine  ? Drug use: No  ? ? ?Ms.Swanson-Bagley reports that she quit smoking about 19 years ago. Her smoking use included cigarettes. She has a 3.50 pack-year smoking history. She has never used smokeless tobacco. She reports current alcohol use. She reports that she does not use drugs. ? ?Tobacco Cessation: ?Former smoker quit 2003 ? ? ?Past surgical hx, Family hx, Social hx all reviewed. ? ?  Current Outpatient Medications on File Prior to Visit  ?Medication Sig  ? atorvastatin (LIPITOR) 20 MG tablet Take 40 mg by mouth in the morning and at bedtime.  ? benazepril (LOTENSIN) 10 MG tablet Take 1 tablet (10 mg total) by mouth daily. (Patient taking differently: Take 10 mg by mouth every evening.)  ? citalopram (CELEXA) 20 MG tablet Take 20 mg by mouth in the morning and at bedtime.  ? cycloSPORINE (RESTASIS) 0.05 % ophthalmic emulsion Place 1 drop into both eyes 2 (two) times daily.   ? ergocalciferol (VITAMIN D2) 1.25 MG (50000 UT) capsule Take 50,000 Units by mouth every Saturday.  ?  famotidine (PEPCID) 20 MG tablet Take 20 mg by mouth at bedtime.  ? Flax Oil-Fish Oil-Borage Oil (FISH OIL-FLAX OIL-BORAGE OIL PO) Take 1 capsule by mouth once a week.  ? gabapentin (NEURONTIN) 300 MG capsule Take 30

## 2021-09-08 NOTE — Progress Notes (Signed)
Oncology Nurse Navigator Documentation ? ?Oncology Nurse Navigator Flowsheets 09/08/2021  ?Navigator Follow Up Date: 09/25/2021  ?Navigator Follow Up Reason: New Patient Appointment  ?Navigator Location [facility]  ?Referral Date to RadOnc/MedOnc 09/08/2021  ?Navigator Encounter Type Other:  ?Barriers/Navigation Needs Coordination of Care/I received a referral on Alexandra Berger today. I notified new patient coordinator to call and schedule he to be seen on 4/3 with labs.   ?Interventions Coordination of Care  ?Acuity Level 2-Minimal Needs (1-2 Barriers Identified)  ?Coordination of Care Other  ?Time Spent with Patient 30  ?  ?

## 2021-09-08 NOTE — Telephone Encounter (Signed)
Scheduled appt per 3/17 referral. Pt's daughter  is aware of appt date and time. Pt's daughter  is aware to arrive 15 mins prior to appt time and to bring and updated insurance card. Pt's daughter is aware of appt location.   ?

## 2021-09-11 ENCOUNTER — Telehealth: Payer: Self-pay | Admitting: Radiation Oncology

## 2021-09-11 ENCOUNTER — Other Ambulatory Visit: Payer: Self-pay

## 2021-09-11 ENCOUNTER — Ambulatory Visit (HOSPITAL_COMMUNITY): Payer: Medicare HMO | Attending: Cardiology

## 2021-09-11 DIAGNOSIS — R931 Abnormal findings on diagnostic imaging of heart and coronary circulation: Secondary | ICD-10-CM

## 2021-09-11 DIAGNOSIS — R55 Syncope and collapse: Secondary | ICD-10-CM

## 2021-09-11 DIAGNOSIS — I10 Essential (primary) hypertension: Secondary | ICD-10-CM

## 2021-09-11 DIAGNOSIS — E782 Mixed hyperlipidemia: Secondary | ICD-10-CM

## 2021-09-11 LAB — ECHOCARDIOGRAM COMPLETE
Area-P 1/2: 3.27 cm2
S' Lateral: 2.4 cm

## 2021-09-11 NOTE — Progress Notes (Signed)
?Radiation Oncology         [phone] ?________________________________ ? ?Name: Alexandra Berger        MRN: [id number]  ?Date of Service: 09/13/2021 DOB: [birth year] ? ?[id number], [facility] [place], [place], DO    ? ?REFERRING PHYSICIAN: Garner Nash, DO ? ? ?DIAGNOSIS: The encounter diagnosis was Malignant neoplasm of lower lobe of left lung ([place]). ? ? ?HISTORY OF PRESENT ILLNESS: Alexandra Berger is a 71 y.o. female seen at the request of Dr. [name] for a newly diagnosed lung cancer. She was found to have a LLL nodule measuring 2.5 cm  on cardiac calcium CT and further diagnostic work up with PET on 07/06/21 showed a 2.4 cm mass in the LLL with an SUV of 3.3. 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 She's seen to discuss treatment recommendations of her tumor. ? ? ? ?PREVIOUS RADIATION THERAPY: No ? ? ?PAST MEDICAL HISTORY:  ?Past Medical History:  ?Diagnosis Date  ? Anxiety and depression   ? Chronic back pain   ? Diabetes mellitus without complication ([place])   ? DJD (degenerative joint disease)   ? Frequent PVCs   ? Hyperlipidemia   ? Hypertension   ? Memory difficulties   ?   ? ? ?PAST SURGICAL HISTORY: ?Past Surgical History:  ?Procedure Laterality Date  ? ABDOMINAL HYSTERECTOMY    ? BRONCHIAL BIOPSY  08/29/2021  ? Procedure: BRONCHIAL BIOPSIES;  Surgeon: Garner Nash, DO;  Location: [facility];  Service: Pulmonary;;  ? BRONCHIAL BRUSHINGS  08/29/2021  ? Procedure: BRONCHIAL BRUSHINGS;  Surgeon: Garner Nash, DO;  Location: [facility];  Service: Pulmonary;;  ? BRONCHIAL NEEDLE ASPIRATION BIOPSY  08/29/2021  ? Procedure: BRONCHIAL NEEDLE ASPIRATION BIOPSIES;  Surgeon: Garner Nash, DO;  Location: [facility];  Service: Pulmonary;;  ? CARDIAC CATHETERIZATION    ? 2014  ? DILATION AND CURETTAGE OF UTERUS    ? EYE SURGERY Left   ? X 2  ? VIDEO BRONCHOSCOPY WITH RADIAL ENDOBRONCHIAL ULTRASOUND  08/29/2021  ? Procedure: RADIAL  ENDOBRONCHIAL ULTRASOUND;  Surgeon: Garner Nash, DO;  Location: [facility];  Service: Pulmonary;;  ? ? ? ?FAMILY HISTORY:  ?Family History  ?Problem Relation Age of Onset  ? CAD Mother   ? CAD Father   ? Heart attack Father   ? Colon cancer Brother 51  ? ? ? ?SOCIAL HISTORY:  reports that she quit smoking about 19 years ago. Her smoking use included cigarettes. She has a 3.50 pack-year smoking history. She has never used smokeless tobacco. She reports current alcohol use. She reports that she does not use drugs. The patient is widowed and lives in [place]. She is accompanied by her daughter Arrie Aran. ? ? ?ALLERGIES: Statins ? ? ?MEDICATIONS:  ?Current Outpatient Medications  ?Medication Sig Dispense Refill  ? atorvastatin (LIPITOR) 80 MG tablet Take 1 tablet (80 mg total) by mouth daily. 90 tablet 3  ? benazepril (LOTENSIN) 10 MG tablet Take 1 tablet (10 mg total) by mouth daily. (Patient taking differently: Take 10 mg by mouth every evening.) 90 tablet 3  ? citalopram (CELEXA) 20 MG tablet Take 20 mg by mouth in the morning and at bedtime.    ? cycloSPORINE (RESTASIS) 0.05 % ophthalmic emulsion Place 1 drop into both eyes 2 (two) times daily.     ? ergocalciferol (VITAMIN D2) 1.25 MG (50000 UT) capsule Take 50,000 Units by mouth every Saturday.    ?  famotidine (PEPCID) 20 MG tablet Take 20 mg by mouth at bedtime.    ? Flax Oil-Fish Oil-Borage Oil (FISH OIL-FLAX OIL-BORAGE OIL PO) Take 1 capsule by mouth once a week.    ? gabapentin (NEURONTIN) 300 MG capsule Take 300 mg by mouth 3 (three) times daily.     ? insulin detemir (LEVEMIR) 100 UNIT/ML injection Inject into the skin See admin instructions. Inject 24 units SQ in the morning and inject 10 units at night.    ? memantine (NAMENDA) 5 MG tablet Take 5 mg by mouth 2 (two) times daily.    ? atorvastatin (LIPITOR) 20 MG tablet Take 40 mg by mouth in the morning and at bedtime. (Patient not taking: Reported on 09/13/2021)    ? omeprazole (PRILOSEC) 20 MG  capsule Take 20 mg by mouth daily. (Patient not taking: Reported on 09/13/2021)    ? ?No current facility-administered medications for this encounter.  ? ? ? ?REVIEW OF SYSTEMS: On review of systems, the patient reports that she is doing okay. She does have some difficulties in mobility and walks with a cane. She denies any shortness of breath at rest. No other specific complaints are verbalized.  ? ?  ? ?PHYSICAL EXAM:  ?Wt Readings from Last 3 Encounters:  ?09/13/21 200 lb 9.6 oz (91 kg)  ?09/08/21 201 lb 6.4 oz (91.4 kg)  ?08/29/21 207 lb (93.9 kg)  ? ?Temp Readings from Last 3 Encounters:  ?09/13/21 98.1 ?F (36.7 ?C)  ?09/08/21 98.9 ?F (37.2 ?C) (Oral)  ?08/29/21 98.1 ?F (36.7 ?C)  ? ?BP Readings from Last 3 Encounters:  ?09/13/21 109/60  ?09/08/21 100/66  ?08/29/21 (!) 100/50  ? ?Pulse Readings from Last 3 Encounters:  ?09/13/21 88  ?09/08/21 99  ?08/29/21 69  ? ?Pain Assessment ?Pain Score: 0-No pain/10 ? ?In general this is a well appearing caucasian female in no acute distress. She's alert and oriented x4 and appropriate throughout the examination. Cardiopulmonary assessment is negative for acute distress and she exhibits normal effort.  ? ? ? ?ECOG = 1 ? ?0 - Asymptomatic (Fully active, able to carry on all predisease activities without restriction) ? ?1 - Symptomatic but completely ambulatory (Restricted in physically strenuous activity but ambulatory and able to carry out work of a light or sedentary nature. For example, light housework, office work) ? ?2 - Symptomatic, <50% in bed during the day (Ambulatory and capable of all self care but unable to carry out any work activities. Up and about more than 50% of waking hours) ? ?3 - Symptomatic, >50% in bed, but not bedbound (Capable of only limited self-care, confined to bed or chair 50% or more of waking hours) ? ?4 - Bedbound (Completely disabled. Cannot carry on any self-care. Totally confined to bed or chair) ? ?5 - Death ? ? Oken MM, Creech RH, Tormey  DC, et al. [phone]). "Toxicity and response criteria of the [facility]". [place] Oncol. 5 (6): 649-55 ? ? ? ?LABORATORY DATA:  ?Lab Results  ?Component Value Date  ? WBC 5.0 08/29/2021  ? HGB 14.3 08/29/2021  ? HCT 42.0 08/29/2021  ? MCV 94.6 08/29/2021  ? PLT 270 08/29/2021  ? ?Lab Results  ?Component Value Date  ? NA 142 08/29/2021  ? K 3.8 08/29/2021  ? CL 103 08/29/2021  ? CO2 27 08/29/2021  ? ?No results found for: ALT, AST, GGT, ALKPHOS, BILITOT ?  ? ?RADIOGRAPHY: DG Chest Port 1 View ? ?Result Date: 08/29/2021 ?CLINICAL  DATA:  Status post bronchoscopy with biopsy. EXAM: PORTABLE CHEST 1 VIEW COMPARISON:  Chest CT 08/25/2021. FINDINGS: Heart size within normal limits. Known 2.4 cm nodule with surrounding ground-glass opacity within the left lower lobe, better delineated on the recent prior chest CT of 08/25/2021. No appreciable airspace consolidation within the right lung. No evidence of pleural effusion or pneumothorax. No acute bony abnormality identified. IMPRESSION: No evidence of acute cardiopulmonary abnormality status post bronchoscopy. Known 2.4 cm nodule with surrounding ground-glass opacity in the left lower lobe, better delineated on the recent prior chest CT of 08/25/2021. Electronically Signed   By: Kellie Simmering D.O.   On: 08/29/2021 11:16  ? ?ECHOCARDIOGRAM COMPLETE ? ?Result Date: 09/11/2021 ?   ECHOCARDIOGRAM REPORT   Patient Name:   Alexandra Berger Date of Exam: 09/11/2021 Medical Rec #:  [id number]            Height:       65.0 in Accession #:    [id number]           Weight:       201.4 lb Date of Birth:  1951/01/18            BSA:          1.984 m? Patient Age:    71 years             BP:           100/76 mmHg Patient Gender: F                    HR:           78 bpm. Exam Location:  [address] Procedure: 2D Echo, Cardiac Doppler and Color Doppler Indications:    R55 Syncope  History:        Patient has no prior history of Echocardiogram examinations.                  CAD, Arrythmias:PVC and NSVT, Signs/Symptoms:Syncope; Risk                 Factors:Hypertension, Dyslipidemia, Diabetes, Obesity and Former                 Smoker.  Sonographer:    Coralyn Helling RDCS [place]

## 2021-09-11 NOTE — Progress Notes (Signed)
Thoracic Location of Tumor / Histology: Left Lower Lobe Lung ? ?Patient was seen by cardiology with a calcium scoring CT.  This found a incidental lung nodule in her left lower lobe. ? ?Bronchoscopy 08/29/2021:  ? ?CT Super D Chest 08/25/2021: The known well-circumscribed nodule anteriorly in the left lower lobe has not significantly changed from previous CTs. Surrounding ground-glass opacity on the PET-CT has improved. No new or enlarging pulmonary nodules are identified. ? ?PET 07/06/2021: 2.4 cm well-circumscribed pulmonary nodule in anterior left lower lobe shows mild hypermetabolism. Differential diagnosis includes pulmonary neuroendocrine tumor and low-grade carcinoma.  No evidence of metastatic disease. ? ?CT Chest (Cardiac Calcium) 06/12/2021: 2.5 cm left lower lobe pulmonary nodule. Consider one of the following in 3 months for both low-risk and high-risk individuals: (a) repeat chest CT, (b) follow-up PET-CT, or (c) tissue sampling. ? ? ?Biopsies of Left Lower Lobe Lung 08/29/2021 ? ? ? ?Tobacco/Marijuana/Snuff/ETOH use: Former Research scientist (life sciences), quit in 2003. ? ?Past/Anticipated interventions by cardiothoracic surgery, if any:  ? ?Past/Anticipated interventions by medical oncology, if any:  ? ?Signs/Symptoms ?Weight changes, if any:  ?Respiratory complaints, if any:  ?Hemoptysis, if any:  ?Pain issues, if any:   ? ?SAFETY ISSUES: ?Prior radiation?  ?Pacemaker/ICD? No  ?Possible current pregnancy? Hysterectomy ?Is the patient on methotrexate? No ? ?Current Complaints / other details:   ?

## 2021-09-11 NOTE — Telephone Encounter (Signed)
Called daughter to get the patient scheduled for a consultation w. Dr. Lisbeth Renshaw. No answer, LVM for a return call.  ?

## 2021-09-12 ENCOUNTER — Ambulatory Visit (INDEPENDENT_AMBULATORY_CARE_PROVIDER_SITE_OTHER): Payer: Medicare HMO | Admitting: Pulmonary Disease

## 2021-09-12 ENCOUNTER — Encounter: Payer: Self-pay | Admitting: Cardiovascular Disease

## 2021-09-12 DIAGNOSIS — R911 Solitary pulmonary nodule: Secondary | ICD-10-CM | POA: Diagnosis not present

## 2021-09-12 LAB — PULMONARY FUNCTION TEST
DL/VA % pred: 110 %
DL/VA: 4.54 ml/min/mmHg/L
DLCO cor % pred: 72 %
DLCO cor: 14.69 ml/min/mmHg
DLCO unc % pred: 76 %
DLCO unc: 15.32 ml/min/mmHg
FEF 25-75 Post: 1.42 L/sec
FEF 25-75 Pre: 1.16 L/sec
FEF2575-%Change-Post: 22 %
FEF2575-%Pred-Post: 72 %
FEF2575-%Pred-Pre: 59 %
FEV1-%Change-Post: 8 %
FEV1-%Pred-Post: 88 %
FEV1-%Pred-Pre: 82 %
FEV1-Post: 2.1 L
FEV1-Pre: 1.95 L
FEV1FVC-%Change-Post: 6 %
FEV1FVC-%Pred-Pre: 87 %
FEV6-%Change-Post: 1 %
FEV6-%Pred-Post: 99 %
FEV6-%Pred-Pre: 97 %
FEV6-Post: 2.96 L
FEV6-Pre: 2.92 L
FEV6FVC-%Change-Post: 0 %
FEV6FVC-%Pred-Post: 104 %
FEV6FVC-%Pred-Pre: 104 %
FVC-%Change-Post: 1 %
FVC-%Pred-Post: 95 %
FVC-%Pred-Pre: 93 %
FVC-Post: 2.97 L
FVC-Pre: 2.92 L
Post FEV1/FVC ratio: 71 %
Post FEV6/FVC ratio: 100 %
Pre FEV1/FVC ratio: 67 %
Pre FEV6/FVC Ratio: 100 %
RV % pred: 162 %
RV: 3.63 L
TLC % pred: 124 %
TLC: 6.49 L

## 2021-09-12 NOTE — Patient Instructions (Signed)
Full PFT performed today. °

## 2021-09-12 NOTE — Progress Notes (Signed)
Full PFT performed today. °

## 2021-09-13 ENCOUNTER — Other Ambulatory Visit: Payer: Self-pay

## 2021-09-13 ENCOUNTER — Encounter: Payer: Self-pay | Admitting: Radiation Oncology

## 2021-09-13 ENCOUNTER — Ambulatory Visit
Admission: RE | Admit: 2021-09-13 | Discharge: 2021-09-13 | Disposition: A | Discharge status: Home / Self Care | Payer: Medicare HMO | Source: Ambulatory Visit | Attending: Radiation Oncology | Admitting: Radiation Oncology

## 2021-09-13 VITALS — BP 109/60 | HR 88 | Temp 98.1°F | Resp 18 | Ht 65.0 in | Wt 200.6 lb

## 2021-09-13 DIAGNOSIS — Z794 Long term (current) use of insulin: Secondary | ICD-10-CM | POA: Insufficient documentation

## 2021-09-13 DIAGNOSIS — E119 Type 2 diabetes mellitus without complications: Secondary | ICD-10-CM | POA: Insufficient documentation

## 2021-09-13 DIAGNOSIS — C3432 Malignant neoplasm of lower lobe, left bronchus or lung: Secondary | ICD-10-CM | POA: Insufficient documentation

## 2021-09-13 DIAGNOSIS — I700 Atherosclerosis of aorta: Secondary | ICD-10-CM | POA: Diagnosis not present

## 2021-09-13 DIAGNOSIS — Z79899 Other long term (current) drug therapy: Secondary | ICD-10-CM | POA: Diagnosis not present

## 2021-09-13 DIAGNOSIS — Z87891 Personal history of nicotine dependence: Secondary | ICD-10-CM | POA: Insufficient documentation

## 2021-09-13 DIAGNOSIS — E785 Hyperlipidemia, unspecified: Secondary | ICD-10-CM | POA: Diagnosis not present

## 2021-09-13 DIAGNOSIS — I3139 Other pericardial effusion (noninflammatory): Secondary | ICD-10-CM | POA: Diagnosis not present

## 2021-09-13 DIAGNOSIS — Z79621 Long term (current) use of calcineurin inhibitor: Secondary | ICD-10-CM | POA: Insufficient documentation

## 2021-09-13 DIAGNOSIS — M47814 Spondylosis without myelopathy or radiculopathy, thoracic region: Secondary | ICD-10-CM | POA: Insufficient documentation

## 2021-09-13 DIAGNOSIS — I10 Essential (primary) hypertension: Secondary | ICD-10-CM | POA: Insufficient documentation

## 2021-09-13 DIAGNOSIS — E1169 Type 2 diabetes mellitus with other specified complication: Secondary | ICD-10-CM | POA: Diagnosis not present

## 2021-09-13 DIAGNOSIS — K449 Diaphragmatic hernia without obstruction or gangrene: Secondary | ICD-10-CM | POA: Diagnosis not present

## 2021-09-13 DIAGNOSIS — R69 Illness, unspecified: Secondary | ICD-10-CM | POA: Diagnosis not present

## 2021-09-13 DIAGNOSIS — D3A8 Other benign neuroendocrine tumors: Secondary | ICD-10-CM | POA: Diagnosis not present

## 2021-09-19 ENCOUNTER — Ambulatory Visit (INDEPENDENT_AMBULATORY_CARE_PROVIDER_SITE_OTHER): Payer: Medicare HMO | Admitting: Pharmacist Clinician (PhC)/ Clinical Pharmacy Specialist

## 2021-09-19 ENCOUNTER — Other Ambulatory Visit: Payer: Self-pay

## 2021-09-19 DIAGNOSIS — I10 Essential (primary) hypertension: Secondary | ICD-10-CM

## 2021-09-19 NOTE — Patient Instructions (Signed)
?  Check your blood pressure at home 2-3 times each week and keep record of the readings. ? ?Take your BP meds as follows: ? Decrease benazepril to 5 mg (1/2 of 10 mg tablet) daily ? ?Bring all of your meds, your BP cuff and your record of home blood pressures to your next appointment.  Exercise as you?re able, try to walk approximately 30 minutes per day.  Keep salt intake to a minimum, especially watch canned and prepared boxed foods.  Eat more fresh fruits and vegetables and fewer canned items.  Avoid eating in fast food restaurants.  ? ? HOW TO TAKE YOUR BLOOD PRESSURE: ?Rest 5 minutes before taking your blood pressure. ? Don?t smoke or drink caffeinated beverages for at least 30 minutes before. ?Take your blood pressure before (not after) you eat. ?Sit comfortably with your back supported and both feet on the floor (don?t cross your legs). ?Elevate your arm to heart level on a table or a desk. ?Use the proper sized cuff. It should fit smoothly and snugly around your bare upper arm. There should be enough room to slip a fingertip under the cuff. The bottom edge of the cuff should be 1 inch above the crease of the elbow. ?Ideally, take 3 measurements at one sitting and record the average. ? ? ?

## 2021-09-19 NOTE — Progress Notes (Signed)
? ? ? ?09/21/2021 ?[name] ?Jun 24, 1951 ?[id number] ? ? ?HPI:  Alexandra Berger is a 71 y.o. female patient of Dr [name], with a PMH below who presents today for hypertension clinic evaluation.  She was seen by Dr. [name] about a month ago, at which time her pressure was noted to be 100/76.  It was noted she has a history of orthostatic hypotension, so he decreased her benazepril from 20 to 10 mg daily.  He asked that she check in with [facility] after a month to follow up. ? ?Today she returns and is here with her granddaughter.  She endorses occasional dizziness, usually with positional changes.   Her last syncopal episode was on March 19, she was getting up from toilet and went down.  States never fully lost consciousness, did not hit her head or otherwise hurt herself.   ? ? ?Past Medical History: ?hyperlipidemia 9/22 LDL 135 on atorvastatin 80  ?Elevated CAC CAC score 158 (78th percentile)  ?Diabetes 2/23 A1c 6.2, on Levemir  ?  ? ?Blood Pressure Goal:  130/80 ? ?Current Medications: benazepril 10 mg qd ? ?Family Hx: mother had hypertension, lived to 85 from heart disease; father with heart disease, died at 82 from MI; 1 sister with hypertension, heart disease; younger sister don't know, older brother don't know, one brother with heart murmur, died in 66's, (7 kids total); oldest had CABG stil living; daugher with hypertension, son Cherlyn Cushing syndrome  ? ?Social Hx: quit smoking in her 40's; red wine 4 times per week, coffee once daily;  ? ?Diet: mostly home cooked foods, no added salt; good mix of vegetables - all fresh; mix of proteins; not snacking much ? ?Exercise: walks in backyard with dogs daily - 2 small dogs, 2 good sized dogs ? ?Home BP readings: home readings 100-110, about 25% < 100 ? ?Intolerances: statins caused myalgia ? ?Labs: 08/29/21: Na 142, K 3.8, Glu 82, BUN 23, SCr 1.20, GFR 48 ? ? ?Wt Readings from Last 3 Encounters:  ?09/19/21 201 lb (91.2 kg)  ?09/13/21 200 lb 9.6 oz (91 kg)  ?09/08/21  201 lb 6.4 oz (91.4 kg)  ? ?BP Readings from Last 3 Encounters:  ?09/19/21 (!) 80/56  ?09/13/21 109/60  ?09/08/21 100/66  ? ?Pulse Readings from Last 3 Encounters:  ?09/19/21 89  ?09/13/21 88  ?09/08/21 99  ? ? ?Current Outpatient Medications  ?Medication Sig Dispense Refill  ? atorvastatin (LIPITOR) 80 MG tablet Take 1 tablet (80 mg total) by mouth daily. 90 tablet 3  ? citalopram (CELEXA) 20 MG tablet Take 20 mg by mouth in the morning and at bedtime.    ? cycloSPORINE (RESTASIS) 0.05 % ophthalmic emulsion Place 1 drop into both eyes 2 (two) times daily.     ? ergocalciferol (VITAMIN D2) 1.25 MG (50000 UT) capsule Take 50,000 Units by mouth every Saturday.    ? famotidine (PEPCID) 20 MG tablet Take 20 mg by mouth at bedtime.    ? gabapentin (NEURONTIN) 300 MG capsule Take 300 mg by mouth 3 (three) times daily.     ? insulin detemir (LEVEMIR) 100 UNIT/ML injection Inject into the skin See admin instructions. Inject 24 units SQ in the morning and inject 10 units at night.    ? ?No current facility-administered medications for this visit.  ? ? ?Allergies  ?Allergen Reactions  ? Pravastatin   ?  dizziness  ? Statins Other (See Comments)  ?  dizziness  ? ? ?Past Medical History:  ?Diagnosis Date  ?  Anxiety and depression   ? Chronic back pain   ? Diabetes mellitus without complication ([place])   ? DJD (degenerative joint disease)   ? Frequent PVCs   ? Hyperlipidemia   ? Hypertension   ? Memory difficulties   ? ? ?Blood pressure (!) 80/56, pulse 89, resp. rate 14, height 5\' 6"  (1.676 m), weight 201 lb (91.2 kg), SpO2 97 %. ? ?Essential hypertension ?Home BP readings still running low, despite decreasing benazepril dose.  Because she is diabetic, we don't want to completely stop ACEI, so will further decrease dose to 5 mg daily.  Explained benefit of ACEI on kidneys in patients with DM2.  Patient will continue to monitor home BP.  Reviewed importance of moving slowly with positional changes to decrease risk of syncopal  episodes.   ? ? ?Tommy Medal PharmD CPP [facility] ?[address] ?[place] [address] ?[place],  [address] ?[phone] ?

## 2021-09-20 DIAGNOSIS — E1169 Type 2 diabetes mellitus with other specified complication: Secondary | ICD-10-CM | POA: Diagnosis not present

## 2021-09-20 DIAGNOSIS — R69 Illness, unspecified: Secondary | ICD-10-CM | POA: Diagnosis not present

## 2021-09-21 ENCOUNTER — Encounter: Payer: Self-pay | Admitting: Pharmacist Clinician (PhC)/ Clinical Pharmacy Specialist

## 2021-09-21 NOTE — Assessment & Plan Note (Signed)
Home BP readings still running low, despite decreasing benazepril dose.  Because she is diabetic, we don't want to completely stop ACEI, so will further decrease dose to 5 mg daily.  Explained benefit of ACEI on kidneys in patients with DM2.  Patient will continue to monitor home BP.  Reviewed importance of moving slowly with positional changes to decrease risk of syncopal episodes.   ?

## 2021-09-21 NOTE — Progress Notes (Signed)
? ?   ?[address] ?      [name] [address] ?            [phone]   ?                 ?[name] ?[place] Record #[id number] ?Date of Birth: [birth year] ? ?Referring: Magdalen Spatz, NP ?Primary Care: [facility] [place] [facility] [place] ?Primary Cardiologist: None ? ?Chief Complaint:    ?Chief Complaint  ?Patient presents with  ? Lung Lesion  ?  Surgical consult,   ? ? ?History of Present Illness:    ?Alexandra Berger 71 y.o. female referred by Dr. 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  She denies any chest pain.  She has been having some intermittent dizziness, and had 1 episode today while rising from sitting.  She does have some shortness of breath but this is likely due to back pain and ambulation. ? ? ?  ? ?Smoking Hx: ?Quit smoking over 20 years ago ? ? ?Zubrod Score: ?At the time of surgery this patient?s most appropriate activity status/level should be described as: ?[]     0    Normal activity, no symptoms ?[x]     1    Restricted in physical strenuous activity but ambulatory, able to do out light work ?[]     2    Ambulatory and capable of self care, unable to do work activities, up and about               >50 % of waking hours                              ?[]     3    Only limited self care, in bed greater than 50% of waking hours ?[]     4    Completely disabled, no self care, confined to bed or chair ?[]     5    Moribund ? ? ?Past Medical History:  ?Diagnosis Date  ? Anxiety and depression   ? Chronic back pain   ? Diabetes mellitus without complication ([place])   ? DJD (degenerative joint disease)   ? Frequent PVCs   ? Hyperlipidemia   ? Hypertension   ? Memory difficulties   ? ? ?Past Surgical History:  ?Procedure Laterality Date  ? ABDOMINAL HYSTERECTOMY    ? BRONCHIAL BIOPSY  08/29/2021  ? Procedure: BRONCHIAL BIOPSIES;  Surgeon: Garner Nash, DO;  Location: [facility];  Service: Pulmonary;;  ?  BRONCHIAL BRUSHINGS  08/29/2021  ? Procedure: BRONCHIAL BRUSHINGS;  Surgeon: Garner Nash, DO;  Location: [facility];  Service: Pulmonary;;  ? BRONCHIAL NEEDLE ASPIRATION BIOPSY  08/29/2021  ? Procedure: BRONCHIAL NEEDLE ASPIRATION BIOPSIES;  Surgeon: Garner Nash, DO;  Location: [facility];  Service: Pulmonary;;  ? CARDIAC CATHETERIZATION    ? 2014  ? DILATION AND CURETTAGE OF UTERUS    ? EYE SURGERY Left   ? X 2  ? VIDEO BRONCHOSCOPY WITH RADIAL ENDOBRONCHIAL ULTRASOUND  08/29/2021  ? Procedure: RADIAL ENDOBRONCHIAL ULTRASOUND;  Surgeon: Garner Nash, DO;  Location: [facility];  Service: Pulmonary;;  ? ? ?Family History  ?Problem Relation Age of Onset  ? CAD Mother   ? CAD Father   ? Heart attack Father   ? Colon cancer Brother 23  ? ? ? ?Social History  ? ?Tobacco Use  ?  Smoking Status Former  ? Packs/day: 0.50  ? Years: 7.00  ? Pack years: 3.50  ? Types: Cigarettes  ? Quit date: 02/23/2002  ? Years since quitting: 19.5  ?Smokeless Tobacco Never  ?  ?Social History  ? ?Substance and Sexual Activity  ?Alcohol Use Yes  ? Comment: wine  ? ? ? ?Allergies  ?Allergen Reactions  ? Pravastatin   ?  dizziness  ? Statins Other (See Comments)  ?  dizziness  ? ? ?Current Outpatient Medications  ?Medication Sig Dispense Refill  ? atorvastatin (LIPITOR) 80 MG tablet Take 1 tablet (80 mg total) by mouth daily. 90 tablet 3  ? citalopram (CELEXA) 20 MG tablet Take 20 mg by mouth in the morning and at bedtime.    ? cycloSPORINE (RESTASIS) 0.05 % ophthalmic emulsion Place 1 drop into both eyes 2 (two) times daily.     ? ergocalciferol (VITAMIN D2) 1.25 MG (50000 UT) capsule Take 50,000 Units by mouth every Saturday.    ? famotidine (PEPCID) 20 MG tablet Take 20 mg by mouth at bedtime.    ? gabapentin (NEURONTIN) 300 MG capsule Take 300 mg by mouth 3 (three) times daily.     ? insulin detemir (LEVEMIR) 100 UNIT/ML injection Inject into the skin See admin instructions. Inject 24 units SQ in the morning and inject 10 units at  night.    ? ?No current facility-administered medications for this visit.  ? ? ?Review of Systems  ?Constitutional:  Positive for malaise/fatigue.  ?Respiratory:  Positive for shortness of breath.   ?Cardiovascular:  Negative for chest pain.  ?Neurological:  Positive for dizziness.  ? ? ?PHYSICAL EXAMINATION: ?BP 112/70   Pulse 82   Resp 20   Ht 5\' 6"  (1.676 m)   Wt 201 lb (91.2 kg)   SpO2 96%   BMI 32.44 kg/m?  ?Physical Exam ?Constitutional:   ?   General: She is not in acute distress. ?   Appearance: Normal appearance. She is obese. She is not ill-appearing.  ?Cardiovascular:  ?   Rate and Rhythm: Normal rate.  ?Pulmonary:  ?   Effort: Pulmonary effort is normal. No respiratory distress.  ?Abdominal:  ?   General: There is no distension.  ?Musculoskeletal:  ?   Comments: Walks with a cane.  ?Neurological:  ?   General: No focal deficit present.  ?   Mental Status: She is alert and oriented to person, place, and time.  ? ? ?Diagnostic Studies & Laboratory data: ?   ? Recent Radiology Findings:  ? DG Chest Port 1 View ? ?Result Date: 08/29/2021 ?CLINICAL DATA:  Status post bronchoscopy with biopsy. EXAM: PORTABLE CHEST 1 VIEW COMPARISON:  Chest CT 08/25/2021. FINDINGS: Heart size within normal limits. Known 2.4 cm nodule with surrounding ground-glass opacity within the left lower lobe, better delineated on the recent prior chest CT of 08/25/2021. No appreciable airspace consolidation within the right lung. No evidence of pleural effusion or pneumothorax. No acute bony abnormality identified. IMPRESSION: No evidence of acute cardiopulmonary abnormality status post bronchoscopy. Known 2.4 cm nodule with surrounding ground-glass opacity in the left lower lobe, better delineated on the recent prior chest CT of 08/25/2021. Electronically Signed   By: Kellie Simmering D.O.   On: 08/29/2021 11:16  ? ?ECHOCARDIOGRAM COMPLETE ? ?Result Date: 09/11/2021 ?   ECHOCARDIOGRAM REPORT   Patient Name:   Alexandra Berger Date of  Exam: 09/11/2021 Medical Rec #:  [id number]  Height:       65.0 in Accession #:    [id number]           Weight:       201.4 lb Date of Birth:  1951/03/27            BSA:          1.984 m? Patient Age:    64 years             BP:           100/76 mmHg Patient Gender: F                    HR:           78 bpm. Exam Location:  [address] Procedure: 2D Echo, Cardiac Doppler and Color Doppler Indications:    R55 Syncope  History:        Patient has no prior history of Echocardiogram examinations.                 CAD, Arrythmias:PVC and NSVT, Signs/Symptoms:Syncope; Risk                 Factors:Hypertension, Dyslipidemia, Diabetes, Obesity and Former                 Smoker.  Sonographer:    Coralyn Helling RDCS Referring Phys: [place]  1. Left ventricular ejection fraction, by estimation, is 60 to 65%. The left ventricle has normal function. The left ventricle has no regional wall motion abnormalities. Left ventricular diastolic parameters were normal.  2. Right ventricular systolic function is normal. The right ventricular size is normal.  3. A small pericardial effusion is present. The pericardial effusion is circumferential. There is no evidence of cardiac tamponade.  4. The mitral valve is normal in structure. Trivial mitral valve regurgitation. No evidence of mitral stenosis.  5. The aortic valve is tricuspid. Aortic valve regurgitation is not visualized. No aortic stenosis is present.  6. The inferior vena cava is normal in size with greater than 50% respiratory variability, suggesting right atrial pressure of 3 mmHg. Comparison(s): No prior Echocardiogram. FINDINGS  Left Ventricle: Left ventricular ejection fraction, by estimation, is 60 to 65%. The left ventricle has normal function. The left ventricle has no regional wall motion abnormalities. The left ventricular internal cavity size was normal in size. There is  no left ventricular hypertrophy. Left ventricular diastolic  parameters were normal. Right Ventricle: The right ventricular size is normal. No increase in right ventricular wall thickness. Right ventricular systolic function is normal. Left Atrium: Left atrial size was

## 2021-09-21 NOTE — H&P (View-Only) (Signed)
? ?   ?[address] ?      [name] [address] ?            [phone]   ?                 ?[name] ?[place] Record #[id number] ?Date of Birth: 1950/12/16 ? ?Referring: Magdalen Spatz, NP ?Primary Care: [employer] [place] [facility] ?Primary Cardiologist: None ? ?Chief Complaint:    ?Chief Complaint  ?Patient presents with  ? Lung Lesion  ?  Surgical consult,   ? ? ?History of Present Illness:    ?Alexandra Berger 71 y.o. female referred by Dr. 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  She denies any chest pain.  She has been having some intermittent dizziness, and had 1 episode today while rising from sitting.  She does have some shortness of breath but this is likely due to back pain and ambulation. ? ? ?  ? ?Smoking Hx: ?Quit smoking over 20 years ago ? ? ?Zubrod Score: ?At the time of surgery this patient?s most appropriate activity status/level should be described as: ?[]     0    Normal activity, no symptoms ?[x]     1    Restricted in physical strenuous activity but ambulatory, able to do out light work ?[]     2    Ambulatory and capable of self care, unable to do work activities, up and about               >50 % of waking hours                              ?[]     3    Only limited self care, in bed greater than 50% of waking hours ?[]     4    Completely disabled, no self care, confined to bed or chair ?[]     5    Moribund ? ? ?Past Medical History:  ?Diagnosis Date  ? Anxiety and depression   ? Chronic back pain   ? Diabetes mellitus without complication ([facility])   ? DJD (degenerative joint disease)   ? Frequent PVCs   ? Hyperlipidemia   ? Hypertension   ? Memory difficulties   ? ? ?Past Surgical History:  ?Procedure Laterality Date  ? ABDOMINAL HYSTERECTOMY    ? BRONCHIAL BIOPSY  08/29/2021  ? Procedure: BRONCHIAL BIOPSIES;  Surgeon: Garner Nash, DO;  Location: [facility];  Service: Pulmonary;;  ?  BRONCHIAL BRUSHINGS  08/29/2021  ? Procedure: BRONCHIAL BRUSHINGS;  Surgeon: Garner Nash, DO;  Location: [facility];  Service: Pulmonary;;  ? BRONCHIAL NEEDLE ASPIRATION BIOPSY  08/29/2021  ? Procedure: BRONCHIAL NEEDLE ASPIRATION BIOPSIES;  Surgeon: Garner Nash, DO;  Location: [facility];  Service: Pulmonary;;  ? CARDIAC CATHETERIZATION    ? 2014  ? DILATION AND CURETTAGE OF UTERUS    ? EYE SURGERY Left   ? X 2  ? VIDEO BRONCHOSCOPY WITH RADIAL ENDOBRONCHIAL ULTRASOUND  08/29/2021  ? Procedure: RADIAL ENDOBRONCHIAL ULTRASOUND;  Surgeon: Garner Nash, DO;  Location: [facility];  Service: Pulmonary;;  ? ? ?Family History  ?Problem Relation Age of Onset  ? CAD Mother   ? CAD Father   ? Heart attack Father   ? Colon cancer Brother 59  ? ? ? ?Social History  ? ?Tobacco Use  ?  Smoking Status Former  ? Packs/day: 0.50  ? Years: 7.00  ? Pack years: 3.50  ? Types: Cigarettes  ? Quit date: 02/23/2002  ? Years since quitting: 19.5  ?Smokeless Tobacco Never  ?  ?Social History  ? ?Substance and Sexual Activity  ?Alcohol Use Yes  ? Comment: wine  ? ? ? ?Allergies  ?Allergen Reactions  ? Pravastatin   ?  dizziness  ? Statins Other (See Comments)  ?  dizziness  ? ? ?Current Outpatient Medications  ?Medication Sig Dispense Refill  ? atorvastatin (LIPITOR) 80 MG tablet Take 1 tablet (80 mg total) by mouth daily. 90 tablet 3  ? citalopram (CELEXA) 20 MG tablet Take 20 mg by mouth in the morning and at bedtime.    ? cycloSPORINE (RESTASIS) 0.05 % ophthalmic emulsion Place 1 drop into both eyes 2 (two) times daily.     ? ergocalciferol (VITAMIN D2) 1.25 MG (50000 UT) capsule Take 50,000 Units by mouth every Saturday.    ? famotidine (PEPCID) 20 MG tablet Take 20 mg by mouth at bedtime.    ? gabapentin (NEURONTIN) 300 MG capsule Take 300 mg by mouth 3 (three) times daily.     ? insulin detemir (LEVEMIR) 100 UNIT/ML injection Inject into the skin See admin instructions. Inject 24 units SQ in the morning and inject 10 units at  night.    ? ?No current facility-administered medications for this visit.  ? ? ?Review of Systems  ?Constitutional:  Positive for malaise/fatigue.  ?Respiratory:  Positive for shortness of breath.   ?Cardiovascular:  Negative for chest pain.  ?Neurological:  Positive for dizziness.  ? ? ?PHYSICAL EXAMINATION: ?BP 112/70   Pulse 82   Resp 20   Ht 5\' 6"  (1.676 m)   Wt 201 lb (91.2 kg)   SpO2 96%   BMI 32.44 kg/m?  ?Physical Exam ?Constitutional:   ?   General: She is not in acute distress. ?   Appearance: Normal appearance. She is obese. She is not ill-appearing.  ?Cardiovascular:  ?   Rate and Rhythm: Normal rate.  ?Pulmonary:  ?   Effort: Pulmonary effort is normal. No respiratory distress.  ?Abdominal:  ?   General: There is no distension.  ?Musculoskeletal:  ?   Comments: Walks with a cane.  ?Neurological:  ?   General: No focal deficit present.  ?   Mental Status: She is alert and oriented to person, place, and time.  ? ? ?Diagnostic Studies & Laboratory data: ?   ? Recent Radiology Findings:  ? DG Chest Port 1 View ? ?Result Date: 08/29/2021 ?CLINICAL DATA:  Status post bronchoscopy with biopsy. EXAM: PORTABLE CHEST 1 VIEW COMPARISON:  Chest CT 08/25/2021. FINDINGS: Heart size within normal limits. Known 2.4 cm nodule with surrounding ground-glass opacity within the left lower lobe, better delineated on the recent prior chest CT of 08/25/2021. No appreciable airspace consolidation within the right lung. No evidence of pleural effusion or pneumothorax. No acute bony abnormality identified. IMPRESSION: No evidence of acute cardiopulmonary abnormality status post bronchoscopy. Known 2.4 cm nodule with surrounding ground-glass opacity in the left lower lobe, better delineated on the recent prior chest CT of 08/25/2021. Electronically Signed   By: Kellie Simmering D.O.   On: 08/29/2021 11:16  ? ?ECHOCARDIOGRAM COMPLETE ? ?Result Date: 09/11/2021 ?   ECHOCARDIOGRAM REPORT   Patient Name:   Alexandra Berger Date of  Exam: 09/11/2021 Medical Rec #:  [id number]  Height:       65.0 in Accession #:    [id number]           Weight:       201.4 lb Date of Birth:  [birth year]            BSA:          1.984 m? Patient Age:    28 years             BP:           100/76 mmHg Patient Gender: F                    HR:           78 bpm. Exam Location:  [address] Procedure: 2D Echo, Cardiac Doppler and Color Doppler Indications:    R55 Syncope  History:        Patient has no prior history of Echocardiogram examinations.                 CAD, Arrythmias:PVC and NSVT, Signs/Symptoms:Syncope; Risk                 Factors:Hypertension, Dyslipidemia, Diabetes, Obesity and Former                 Smoker.  Sonographer:    Coralyn Helling RDCS Referring Phys: [place]  1. Left ventricular ejection fraction, by estimation, is 60 to 65%. The left ventricle has normal function. The left ventricle has no regional wall motion abnormalities. Left ventricular diastolic parameters were normal.  2. Right ventricular systolic function is normal. The right ventricular size is normal.  3. A small pericardial effusion is present. The pericardial effusion is circumferential. There is no evidence of cardiac tamponade.  4. The mitral valve is normal in structure. Trivial mitral valve regurgitation. No evidence of mitral stenosis.  5. The aortic valve is tricuspid. Aortic valve regurgitation is not visualized. No aortic stenosis is present.  6. The inferior vena cava is normal in size with greater than 50% respiratory variability, suggesting right atrial pressure of 3 mmHg. Comparison(s): No prior Echocardiogram. FINDINGS  Left Ventricle: Left ventricular ejection fraction, by estimation, is 60 to 65%. The left ventricle has normal function. The left ventricle has no regional wall motion abnormalities. The left ventricular internal cavity size was normal in size. There is  no left ventricular hypertrophy. Left ventricular diastolic  parameters were normal. Right Ventricle: The right ventricular size is normal. No increase in right ventricular wall thickness. Right ventricular systolic function is normal. Left Atrium: Left atrial size was

## 2021-09-22 ENCOUNTER — Institutional Professional Consult (permissible substitution): Payer: Medicare HMO | Admitting: Thoracic Surgery (Cardiothoracic Vascular Surgery)

## 2021-09-22 ENCOUNTER — Other Ambulatory Visit: Payer: Self-pay | Admitting: *Deleted

## 2021-09-22 ENCOUNTER — Encounter: Payer: Self-pay | Admitting: *Deleted

## 2021-09-22 VITALS — BP 112/70 | HR 82 | Resp 20 | Ht 66.0 in | Wt 201.0 lb

## 2021-09-22 DIAGNOSIS — R911 Solitary pulmonary nodule: Secondary | ICD-10-CM

## 2021-09-22 DIAGNOSIS — D3A090 Benign carcinoid tumor of the bronchus and lung: Secondary | ICD-10-CM

## 2021-09-25 ENCOUNTER — Encounter: Payer: Self-pay | Admitting: Internal Medicine

## 2021-09-25 ENCOUNTER — Telehealth: Payer: Self-pay

## 2021-09-25 ENCOUNTER — Inpatient Hospital Stay: Payer: Medicare HMO | Attending: Internal Medicine | Admitting: Internal Medicine

## 2021-09-25 ENCOUNTER — Inpatient Hospital Stay: Payer: Medicare HMO

## 2021-09-25 NOTE — Telephone Encounter (Signed)
Patient did not show for new patient appointment today. I called patient's daughter who stated that she meant to cancel apt. Patient is scheduled for thorascopy on 09/28/21. Daughter stated that she will call back to reschedule. ?

## 2021-09-26 NOTE — Progress Notes (Signed)
Surgical Instructions ? ? ? Your procedure is scheduled on 09/28/21. ? Report to [facility] Main Entrance "A" at 5:30 A.M., then check in with the Admitting office. ? Call this number if you have problems the morning of surgery: ? [phone] ? ? If you have any questions prior to your surgery date call [phone]: Open Monday-Friday 8am-4pm ? ? ? Remember: ? Do not eat or drink after midnight the night before your surgery ? ? ? Take these medicines the morning of surgery with A SIP OF WATER:  ?cycloSPORINE (RESTASIS) eye drops ?gabapentin (NEURONTIN) ? ? ?As of today, STOP taking any Aspirin (unless otherwise instructed by your surgeon) Aleve, Naproxen, Ibuprofen, Motrin, Advil, Goody's, BC's, all herbal medications, fish oil, and all vitamins. ? ?WHAT DO I DO ABOUT MY DIABETES MEDICATION? ? ? ?Do not take oral diabetes medicines (pills) the morning of surgery. ? ?THE NIGHT BEFORE SURGERY, take 5 units of insulin detemir (LEVEMIR).    ? ? ?THE MORNING OF SURGERY, take 5 units of insulin detemir (LEVEMIR). ? ?The day of surgery, do not take other diabetes injectables, including Byetta (exenatide), Bydureon (exenatide ER), Victoza (liraglutide), or Trulicity (dulaglutide). ? ?If your CBG is greater than 220 mg/dL, you may take ? of your sliding scale (correction) dose of insulin. ? ? ?HOW TO MANAGE YOUR DIABETES ?BEFORE AND AFTER SURGERY ? ?Why is it important to control my blood sugar before and after surgery? ?Improving blood sugar levels before and after surgery helps healing and can limit problems. ?A way of improving blood sugar control is eating a healthy diet by: ? Eating less sugar and carbohydrates ? Increasing activity/exercise ? Talking with your doctor about reaching your blood sugar goals ?High blood sugars (greater than 180 mg/dL) can raise your risk of infections and slow your recovery, so you will need to focus on controlling your diabetes during the weeks before surgery. ?Make sure that the doctor  who takes care of your diabetes knows about your planned surgery including the date and location. ? ?How do I manage my blood sugar before surgery? ?Check your blood sugar at least 4 times a day, starting 2 days before surgery, to make sure that the level is not too high or low. ? ?Check your blood sugar the morning of your surgery when you wake up and every 2 hours until you get to the Short Stay unit. ? ?If your blood sugar is less than 70 mg/dL, you will need to treat for low blood sugar: ?Do not take insulin. ?Treat a low blood sugar (less than 70 mg/dL) with ? cup of clear juice (cranberry or apple), 4 glucose tablets, OR glucose gel. ?Recheck blood sugar in 15 minutes after treatment (to make sure it is greater than 70 mg/dL). If your blood sugar is not greater than 70 mg/dL on recheck, call [phone] for further instructions. ?Report your blood sugar to the short stay nurse when you get to Short Stay. ? ?If you are admitted to the hospital after surgery: ?Your blood sugar will be checked by the staff and you will probably be given insulin after surgery (instead of oral diabetes medicines) to make sure you have good blood sugar levels. ?The goal for blood sugar control after surgery is 80-180 mg/dL. ?    ?Do not wear jewelry or makeup ?Do not wear lotions, powders, perfumes/colognes, or deodorant. ?Do not shave 48 hours prior to surgery.   ?Do not bring valuables to the hospital. ?Do not wear nail polish,  gel polish, artificial nails, or any other type of covering on natural nails (fingers and toes) ?If you have artificial nails or gel coating that need to be removed by a nail salon, please have this removed prior to surgery. Artificial nails or gel coating may interfere with anesthesia's ability to adequately monitor your vital signs. ? ?[facility] is not responsible for any belongings or valuables. .  ? ?Do NOT Smoke (Tobacco/Vaping)  24 hours prior to your procedure ? ?If you use a CPAP at night, you  may bring your mask for your overnight stay. ?  ?Contacts, glasses, hearing aids, dentures or partials may not be worn into surgery, please bring cases for these belongings ?  ?For patients admitted to the hospital, discharge time will be determined by your treatment team. ?  ?Patients discharged the day of surgery will not be allowed to drive home, and someone needs to stay with them for 24 hours. ? ? ?SURGICAL WAITING ROOM VISITATION ?Patients having surgery or a procedure in a hospital may have two support people. ?Children under the age of 55 must have an adult with them who is not the patient. ?They may stay in the waiting area during the procedure and may switch out with other visitors. If the patient needs to stay at the hospital during part of their recovery, the visitor guidelines for inpatient rooms apply. ? ?Please refer to the [facility] website for the visitor guidelines for Inpatients (after your surgery is over and you are in a regular room).  ? ? ? ? ? ?Special instructions:   ? ?Oral Hygiene is also important to reduce your risk of infection.  Remember - BRUSH YOUR TEETH THE MORNING OF SURGERY WITH YOUR REGULAR TOOTHPASTE ? ? ?[facility]- Preparing For Surgery ? ?Before surgery, you can play an important role. Because skin is not sterile, your skin needs to be as free of germs as possible. You can reduce the number of germs on your skin by washing with CHG (chlorahexidine gluconate) Soap before surgery.  CHG is an antiseptic cleaner which kills germs and bonds with the skin to continue killing germs even after washing.   ? ? ?Please do not use if you have an allergy to CHG or antibacterial soaps. If your skin becomes reddened/irritated stop using the CHG.  ?Do not shave (including legs and underarms) for at least 48 hours prior to first CHG shower. It is OK to shave your face. ? ?Please follow these instructions carefully. ?  ? ? Shower the NIGHT BEFORE SURGERY and the MORNING OF SURGERY with CHG  Soap.  ? If you chose to wash your hair, wash your hair first as usual with your normal shampoo. After you shampoo, rinse your hair and body thoroughly to remove the shampoo.  Then [employer] and genitals (private parts) with your normal soap and rinse thoroughly to remove soap. ? ?After that Use CHG Soap as you would any other liquid soap. You can apply CHG directly to the skin and wash gently with a scrungie or a clean washcloth.  ? ?Apply the CHG Soap to your body ONLY FROM THE NECK DOWN.  Do not use on open wounds or open sores. Avoid contact with your eyes, ears, mouth and genitals (private parts). Wash Face and genitals (private parts)  with your normal soap.  ? ?Wash thoroughly, paying special attention to the area where your surgery will be performed. ? ?Thoroughly rinse your body with warm water from the neck  down. ? ?DO NOT shower/wash with your normal soap after using and rinsing off the CHG Soap. ? ?Pat yourself dry with a CLEAN TOWEL. ? ?Wear CLEAN PAJAMAS to bed the night before surgery ? ?Place CLEAN SHEETS on your bed the night before your surgery ? ?DO NOT SLEEP WITH PETS. ? ? ?Day of Surgery: ?Take a shower with CHG soap. ?Wear Clean/Comfortable clothing the morning of surgery ?Do not apply any deodorants/lotions.   ?Remember to brush your teeth WITH YOUR REGULAR TOOTHPASTE. ? ? ? ?If you received a COVID test during your pre-op visit  it is requested that you wear a mask when out in public, stay away from anyone that may not be feeling well and notify your surgeon if you develop symptoms. If you have been in contact with anyone that has tested positive in the last 10 days please notify you surgeon. ? ?  ?Please read over the following fact sheets that you were given.  ? ?

## 2021-09-27 ENCOUNTER — Encounter (HOSPITAL_COMMUNITY)
Admission: RE | Admit: 2021-09-27 | Discharge: 2021-09-27 | Disposition: A | Discharge status: Home / Self Care | Payer: Medicare HMO | Source: Ambulatory Visit | Attending: Thoracic Surgery (Cardiothoracic Vascular Surgery) | Admitting: Thoracic Surgery (Cardiothoracic Vascular Surgery)

## 2021-09-27 ENCOUNTER — Other Ambulatory Visit: Payer: Self-pay

## 2021-09-27 ENCOUNTER — Ambulatory Visit (HOSPITAL_COMMUNITY)
Admission: RE | Admit: 2021-09-27 | Discharge: 2021-09-27 | Disposition: A | Discharge status: Home / Self Care | Payer: Medicare HMO | Source: Ambulatory Visit | Attending: Thoracic Surgery (Cardiothoracic Vascular Surgery) | Admitting: Thoracic Surgery (Cardiothoracic Vascular Surgery)

## 2021-09-27 ENCOUNTER — Encounter (HOSPITAL_COMMUNITY): Payer: Self-pay

## 2021-09-27 VITALS — BP 129/85 | HR 91 | Temp 98.4°F | Resp 19 | Ht 66.0 in | Wt 195.9 lb

## 2021-09-27 DIAGNOSIS — C7A090 Malignant carcinoid tumor of the bronchus and lung: Secondary | ICD-10-CM | POA: Diagnosis not present

## 2021-09-27 DIAGNOSIS — E119 Type 2 diabetes mellitus without complications: Secondary | ICD-10-CM | POA: Insufficient documentation

## 2021-09-27 DIAGNOSIS — D62 Acute posthemorrhagic anemia: Secondary | ICD-10-CM | POA: Diagnosis not present

## 2021-09-27 DIAGNOSIS — R69 Illness, unspecified: Secondary | ICD-10-CM | POA: Diagnosis not present

## 2021-09-27 DIAGNOSIS — Z87891 Personal history of nicotine dependence: Secondary | ICD-10-CM | POA: Insufficient documentation

## 2021-09-27 DIAGNOSIS — I10 Essential (primary) hypertension: Secondary | ICD-10-CM | POA: Diagnosis not present

## 2021-09-27 DIAGNOSIS — I493 Ventricular premature depolarization: Secondary | ICD-10-CM | POA: Insufficient documentation

## 2021-09-27 DIAGNOSIS — Z01818 Encounter for other preprocedural examination: Secondary | ICD-10-CM | POA: Insufficient documentation

## 2021-09-27 DIAGNOSIS — Z20822 Contact with and (suspected) exposure to covid-19: Secondary | ICD-10-CM | POA: Insufficient documentation

## 2021-09-27 DIAGNOSIS — K219 Gastro-esophageal reflux disease without esophagitis: Secondary | ICD-10-CM | POA: Insufficient documentation

## 2021-09-27 DIAGNOSIS — D3A090 Benign carcinoid tumor of the bronchus and lung: Secondary | ICD-10-CM | POA: Insufficient documentation

## 2021-09-27 DIAGNOSIS — E669 Obesity, unspecified: Secondary | ICD-10-CM | POA: Diagnosis not present

## 2021-09-27 DIAGNOSIS — D696 Thrombocytopenia, unspecified: Secondary | ICD-10-CM | POA: Diagnosis not present

## 2021-09-27 DIAGNOSIS — I3139 Other pericardial effusion (noninflammatory): Secondary | ICD-10-CM | POA: Insufficient documentation

## 2021-09-27 DIAGNOSIS — I959 Hypotension, unspecified: Secondary | ICD-10-CM | POA: Diagnosis not present

## 2021-09-27 DIAGNOSIS — R911 Solitary pulmonary nodule: Secondary | ICD-10-CM | POA: Diagnosis not present

## 2021-09-27 DIAGNOSIS — E785 Hyperlipidemia, unspecified: Secondary | ICD-10-CM | POA: Insufficient documentation

## 2021-09-27 HISTORY — DX: Gastro-esophageal reflux disease without esophagitis: K21.9

## 2021-09-27 LAB — URINALYSIS, ROUTINE W REFLEX MICROSCOPIC
Bilirubin Urine: NEGATIVE
Glucose, UA: NEGATIVE mg/dL
Hgb urine dipstick: NEGATIVE
Ketones, ur: NEGATIVE mg/dL
Nitrite: NEGATIVE
Protein, ur: 30 mg/dL — AB
Specific Gravity, Urine: 1.025 (ref 1.005–1.030)
pH: 7 (ref 5.0–8.0)

## 2021-09-27 LAB — COMPREHENSIVE METABOLIC PANEL
ALT: 15 U/L (ref 0–44)
AST: 18 U/L (ref 15–41)
Albumin: 3.8 g/dL (ref 3.5–5.0)
Alkaline Phosphatase: 54 U/L (ref 38–126)
Anion gap: 7 (ref 5–15)
BUN: 16 mg/dL (ref 8–23)
CO2: 26 mmol/L (ref 22–32)
Calcium: 9.1 mg/dL (ref 8.9–10.3)
Chloride: 107 mmol/L (ref 98–111)
Creatinine, Ser: 1.06 mg/dL — ABNORMAL HIGH (ref 0.44–1.00)
GFR, Estimated: 57 mL/min — ABNORMAL LOW (ref >60)
Glucose, Bld: 105 mg/dL — ABNORMAL HIGH (ref 70–99)
Potassium: 4.2 mmol/L (ref 3.5–5.1)
Sodium: 140 mmol/L (ref 135–145)
Total Bilirubin: 0.5 mg/dL (ref 0.3–1.2)
Total Protein: 6.1 g/dL — ABNORMAL LOW (ref 6.5–8.1)

## 2021-09-27 LAB — BLOOD GAS, ARTERIAL
Acid-Base Excess: 6.6 mmol/L — ABNORMAL HIGH (ref 0.0–2.0)
Bicarbonate: 32.4 mmol/L — ABNORMAL HIGH (ref 20.0–28.0)
Drawn by: 58793
O2 Saturation: 97.9 %
Patient temperature: 37
pCO2 arterial: 50 mmHg — ABNORMAL HIGH (ref 32–48)
pH, Arterial: 7.42 (ref 7.35–7.45)
pO2, Arterial: 85 mmHg (ref 83–108)

## 2021-09-27 LAB — HEMOGLOBIN A1C
Hgb A1c MFr Bld: 6 % — ABNORMAL HIGH (ref 4.8–5.6)
Mean Plasma Glucose: 125.5 mg/dL

## 2021-09-27 LAB — CBC
HCT: 43.3 % (ref 36.0–46.0)
Hemoglobin: 14.1 g/dL (ref 12.0–15.0)
MCH: 31.3 pg (ref 26.0–34.0)
MCHC: 32.6 g/dL (ref 30.0–36.0)
MCV: 96 fL (ref 80.0–100.0)
Platelets: 195 10*3/uL (ref 150–400)
RBC: 4.51 MIL/uL (ref 3.87–5.11)
RDW: 13.3 % (ref 11.5–15.5)
WBC: 4.3 10*3/uL (ref 4.0–10.5)
nRBC: 0 % (ref 0.0–0.2)

## 2021-09-27 LAB — PROTIME-INR
INR: 0.9 (ref 0.8–1.2)
Prothrombin Time: 12.5 seconds (ref 11.4–15.2)

## 2021-09-27 LAB — GLUCOSE, CAPILLARY: Glucose-Capillary: 112 mg/dL — ABNORMAL HIGH (ref 70–99)

## 2021-09-27 LAB — SURGICAL PCR SCREEN
MRSA, PCR: NEGATIVE
Staphylococcus aureus: NEGATIVE

## 2021-09-27 LAB — APTT: aPTT: 25 seconds (ref 24–36)

## 2021-09-27 NOTE — Anesthesia Preprocedure Evaluation (Addendum)
Anesthesia Evaluation  ?Patient identified by MRN, date of birth, ID band ?Patient awake ? ? ? ?Reviewed: ?Allergy & Precautions, NPO status , Patient's Chart, lab work & pertinent test results ? ?History of Anesthesia Complications ?Negative for: history of anesthetic complications ? ?Airway ?Mallampati: III ? ?TM Distance: >3 FB ?Neck ROM: Full ? ? ? Dental ? ?(+) Missing, Dental Advisory Given,  ?  ?Pulmonary ?former smoker,  ?CARCINOID TUMOR ?  ?breath sounds clear to auscultation ? ? ? ? ? ? Cardiovascular ?hypertension, Pt. on medications ?(-) angina(-) CHF + dysrhythmias  ?Rhythm:Regular  ?History includes former smoker (quit 02/23/02), DM2, HTN, HLD, frequent PVCs, memory difficulties, GERD, chronic back pain. She is s/p bronchoscopy 08/29/21 and diagnosed with neuroendocrine tumor of the LLL.  ?? ?Dr. [name] classified her Zubrod score as 1: Restricted in physical strenuous activity but ambulatory, able to do out light work. ?? ?She saw cardiologist Dr. [name] initially on 05/23/21 for orthostatic hypotension with unwitnessed syncopal episode about 5 months prior.  A 30-day Zio patch was ordered and showed frequent PVCs with run of NSVT and SVT.  Coronary calcium score was ordered for HLD and was elevated at 158 "most of which was in the LAD although she denies chest pain".  At her last visit on 08/22/21, Dr. [name] continued her on high-dose statin therapy, but she was also referred to Dr. Lysbeth Penner lipid clinic for further management.  He decreased her benazepril and planned to refer her to EP for any additional recommendations for PVCs (not done yet). An incidental LLL pulmonary nodule was seen on her Coronary Ca Score CT which prompted referral to pulmonology. She was seen at the [facility]N clinic for orthostatic hypotension follow-up on 09/19/21 by Tommy Medal, PharmD, CPP. She also had "essentially normal" echo except small circumferential pericardial effusion  on 09/11/21.  ?? ?  ?Neuro/Psych ?  ? GI/Hepatic ?GERD  Medicated and Controlled,  ?Endo/Other  ?diabetesLab Results ?     Component                Value               Date                 ?     HGBA1C                   6.0 (H)             09/27/2021           ? ? Renal/GU ?Renal InsufficiencyRenal diseaseLab Results ?     Component                Value               Date                 ?     CREATININE               1.06 (H)            09/27/2021           ?  ? ?  ?Musculoskeletal ? ?(+) Arthritis ,  ? Abdominal ?  ?Peds ? Hematology ?negative hematology ROS ?(+) Lab Results ?     Component                Value  Date                 ?     WBC                      4.3                 09/27/2021           ?     HGB                      14.1                09/27/2021           ?     HCT                      43.3                09/27/2021           ?     MCV                      96.0                09/27/2021           ?     PLT                      195                 09/27/2021           ?   ?Anesthesia Other Findings ?1. Left ventricular ejection fraction, by estimation, is 60 to 65%. The  ?left ventricle has normal function. The left ventricle has no regional  ?wall motion abnormalities. Left ventricular diastolic parameters were  ?normal.  ??2. Right ventricular systolic function is normal. The right ventricular  ?size is normal.  ??3. A small pericardial effusion is present. The pericardial effusion is  ?circumferential. There is no evidence of cardiac tamponade.  ??4. The mitral valve is normal in structure. Trivial mitral valve  ?regurgitation. No evidence of mitral stenosis.  ??5. The aortic valve is tricuspid. Aortic valve regurgitation is not  ?visualized. No aortic stenosis is present.  ??6. The inferior vena cava is normal in size with greater than 50%  ?respiratory variability, suggesting right atrial pressure of 3 mmHg.  ? Reproductive/Obstetrics ? ?  ? ? ? ? ? ? ? ? ? ? ? ? ? ?  ?   ? ? ? ? ? ? ? ?Anesthesia Physical ?Anesthesia Plan ? ?ASA: 2 ? ?Anesthesia Plan: General  ? ?Post-op Pain Management: Ofirmev IV (intra-op)*  ? ?Induction: Intravenous ? ?PONV Risk Score and Plan: 3 and Ondansetron and Dexamethasone ? ?Airway Management Planned: Double Lumen EBT ? ?Additional Equipment: Arterial line ? ?Intra-op Plan:  ? ?Post-operative Plan: Extubation in OR ? ?Informed Consent: I have reviewed the patients History and Physical, chart, labs and discussed the procedure including the risks, benefits and alternatives for the proposed anesthesia with the patient or authorized representative who has indicated his/her understanding and acceptance.  ? ? ? ?Dental advisory given ? ?Plan Discussed with: CRNA and Surgeon ? ?Anesthesia Plan Comments: (PAT note written 09/27/2021 by Myra Gianotti, PA-C. ?)  ? ? ? ? ? ?Anesthesia Quick Evaluation ? ?

## 2021-09-27 NOTE — Progress Notes (Signed)
Message sent to Dr. [name] regarding many bacteria in urinalysis that resulted today.  ?

## 2021-09-27 NOTE — Progress Notes (Addendum)
PCP:  [facility] ?Cardiologist:  Quay Burow, MD ? ?EKG:  09/27/21 ?CXR:  09/27/21 ?ECHO: 09/11/21 ?Stress Test: denies ?Cardiac Cath: 2014 ? ?Fasting Blood Sugar-  90's ?Checks Blood Sugar__2_ times a day ? ?ASA/Blood Thinner: No ? ?OSA/CPAP: No ? ?Covid test 09/27/21 at PAT ? ?Anesthesia Review:  Yes, recent echo with pericardial effusion.  Spoke to [place], [place].  ? ?Patient denies shortness of breath, fever, cough, and chest pain at PAT appointment. ? ?Patient verbalized understanding of instructions provided today at the PAT appointment.  Patient asked to review instructions at home and day of surgery.   ?

## 2021-09-27 NOTE — Progress Notes (Signed)
Anesthesia Chart Review: ? Case: [id number] Date/Time: 09/28/21 0715  ? Procedure: XI ROBOTIC ASSISTED THORASCOPY-LEFT LOWER LOBECTOMY (Left: Chest)  ? Anesthesia type: General  ? Pre-op diagnosis: CARCINOID TUMOR  ? [facility] RO[facility] 10 / [facility]  ? Surgeons: Lajuana Matte, MD  ? ?  ? ? ?DISCUSSION: Patient is a 71 year old female scheduled for the above procedure. ? ?History includes former smoker (quit 02/23/02), DM2, HTN, HLD, frequent PVCs, memory difficulties, GERD, chronic back pain. She is s/p bronchoscopy 08/29/21 and diagnosed with neuroendocrine tumor of the LLL.  ? ?Dr. [name] classified her Zubrod score as 1: Restricted in physical strenuous activity but ambulatory, able to do out light work. ? ?She saw cardiologist Dr. [name] initially on 05/23/21 for orthostatic hypotension with unwitnessed syncopal episode about 5 months prior.  A 30-day Zio patch was ordered and showed frequent PVCs with run of NSVT and SVT.  Coronary calcium score was ordered for HLD and was elevated at 158 "most of which was in the LAD although she denies chest pain".  At her last visit on 08/22/21, Dr. [name] continued her on high-dose statin therapy, but she was also referred to Dr. Lysbeth Penner lipid clinic for further management.  He decreased her benazepril and planned to refer her to EP for any additional recommendations for PVCs (not done yet). An incidental LLL pulmonary nodule was seen on her Coronary Ca Score CT which prompted referral to pulmonology. She was seen at the [facility]N clinic for orthostatic hypotension follow-up on 09/19/21 by Tommy Medal, PharmD, CPP. She also had "essentially normal" echo except small circumferential pericardial effusion on 09/11/21.  ? ?Anesthesia team to evaluate on the day of surgery. 09/27/21 CXR is still in process. TCTS RN Ryan to review UA results with Dr. [name].  ? ? ?VS: BP 129/85   Pulse 91   Temp 36.9 ?C   Resp 19   Ht 5\' 6"  (1.676 m)   Wt 88.9 kg   SpO2 96%    BMI 31.62 kg/m?  ? ? ?PROVIDERS: ?[place], [place] [facility] [place] is PCP  ?June Leap, DO is pulmonologist ?Quay Burow, MD is cardiologist. ?Kyung Rudd, MD is RAD-ONC ? ? ?LABS: Preoperative labs noted. See DISCUSSION.  ?(all labs ordered are listed, but only abnormal results are displayed) ? ?Labs Reviewed  ?GLUCOSE, CAPILLARY - Abnormal; Notable for the following components:  ?    Result Value  ? Glucose-Capillary 112 (*)   ? All other components within normal limits  ?BLOOD GAS, ARTERIAL - Abnormal; Notable for the following components:  ? pCO2 arterial 50 (*)   ? Bicarbonate 32.4 (*)   ? Acid-Base Excess 6.6 (*)   ? All other components within normal limits  ?COMPREHENSIVE METABOLIC PANEL - Abnormal; Notable for the following components:  ? Glucose, Bld 105 (*)   ? Creatinine, Ser 1.06 (*)   ? Total Protein 6.1 (*)   ? GFR, Estimated 57 (*)   ? All other components within normal limits  ?URINALYSIS, ROUTINE W REFLEX MICROSCOPIC - Abnormal; Notable for the following components:  ? Color, Urine AMBER (*)   ? APPearance CLOUDY (*)   ? Protein, ur 30 (*)   ? Leukocytes,Ua LARGE (*)   ? Bacteria, UA MANY (*)   ? Non Squamous Epithelial 0-5 (*)   ? All other components within normal limits  ?HEMOGLOBIN A1C - Abnormal; Notable for the following components:  ? Hgb A1c MFr Bld 6.0 (*)   ? All  other components within normal limits  ?SURGICAL PCR SCREEN  ?SARS CORONAVIRUS 2 (TAT 6-24 HRS)  ?APTT  ?CBC  ?PROTIME-INR  ?TYPE AND SCREEN  ? ? ?PFTs 09/12/21: ?- FVC 2.92 (93%), post 2.97 (95%) ?- FEV1 1.95 (82%), post 2.10 (88%) ?- DLCO cor 14.69 (72%) ?  ? ?IMAGES: ?CXR 09/27/21: In process. ? ?CT Super D Chest 08/25/21: ?IMPRESSION: ?1. Imaging for bronchoscopic planning and guidance. ?2. The known well-circumscribed nodule anteriorly in the left lower ?lobe has not significantly changed from previous CTs. Surrounding ?ground-glass opacity on the PET-CT has improved. No new or enlarging ?pulmonary nodules are  identified. ?3. No adenopathy or pleural effusion.  Moderate size hiatal hernia. ?4. Coronary and Aortic Atherosclerosis (ICD10-I70.0). ?  ? ?EKG: 09/27/21: NSR ? ? ?CV: ?Echo 09/11/21: ?IMPRESSIONS  ? 1. Left ventricular ejection fraction, by estimation, is 60 to 65%. The  ?left ventricle has normal function. The left ventricle has no regional  ?wall motion abnormalities. Left ventricular diastolic parameters were  ?normal.  ? 2. Right ventricular systolic function is normal. The right ventricular  ?size is normal.  ? 3. A small pericardial effusion is present. The pericardial effusion is  ?circumferential. There is no evidence of cardiac tamponade.  ? 4. The mitral valve is normal in structure. Trivial mitral valve  ?regurgitation. No evidence of mitral stenosis.  ? 5. The aortic valve is tricuspid. Aortic valve regurgitation is not  ?visualized. No aortic stenosis is present.  ? 6. The inferior vena cava is normal in size with greater than 50%  ?respiratory variability, suggesting right atrial pressure of 3 mmHg.  ?- Comparison(s): No prior Echocardiogram.  ? ? ?Cardiac event monitor 05/28/21-06/26/21: ?1. NSR/SB/ST ?2. Freq PVCs and short runs of NSVT ?3. Runs of SVT ?4. Needs ROV to discuss ? ? ?CT Coronary Calcium Scoring 06/12/21: ?  ?IMPRESSION: ?1. Coronary calcium score of 158. This was 78th percentile for age-, ?race-, and sex-matched controls. ?2. Dilated main pulmonary artery to 35 mm, suggestive of pulmonary ?hypertension. ?3. Aortic atherosclerosis. ?4. Aortic and mitral annular calcification. ? ?RECOMMENDATIONS: ?Coronary artery calcium (CAC) score is a strong predictor of ?incident coronary heart disease (CHD) and provides predictive ?information beyond traditional risk factors. CAC scoring is ?reasonable to use in the decision to withhold, postpone, or initiate ?statin therapy in intermediate-risk or selected borderline-risk ?asymptomatic adults (age 29-75 years and LDL-C >=70 to <190 mg/dL) ?who do not  have diabetes or established atherosclerotic ?cardiovascular disease (ASCVD).* In intermediate-risk (10-year ASCVD ?risk >=7.5% to <20%) adults or selected borderline-risk (10-year ?ASCVD risk >=5% to <7.5%) adults in whom a CAC score is measured for ?the purpose of making a treatment decision the following ?recommendations have been made: ?  ?If CAC=0, it is reasonable to withhold statin therapy and reassess ?in 5 to 10 years, as long as higher risk conditions are absent ?(diabetes mellitus, family history of premature CHD in first degree ?relatives (males <55 years; females <65 years), cigarette smoking, ?or LDL >=190 mg/dL). ?  ?If CAC is 1 to 99, it is reasonable to initiate statin therapy for ?patients >=2 years of age. ?  ?If CAC is >=100 or >=75th percentile, it is reasonable to initiate ?statin therapy at any age. ?  ?Cardiology referral should be considered for patients with CAC ?scores >=400 or >=75th percentile. ? ?Past Medical History:  ?Diagnosis Date  ? Anxiety and depression   ? Chronic back pain   ? Diabetes mellitus without complication ([place])   ? DJD (degenerative  joint disease)   ? Frequent PVCs   ? GERD (gastroesophageal reflux disease)   ? Hyperlipidemia   ? Hypertension   ? Memory difficulties   ? ? ?Past Surgical History:  ?Procedure Laterality Date  ? ABDOMINAL HYSTERECTOMY    ? BRONCHIAL BIOPSY  08/29/2021  ? Procedure: BRONCHIAL BIOPSIES;  Surgeon: Garner Nash, DO;  Location: [facility];  Service: Pulmonary;;  ? BRONCHIAL BRUSHINGS  08/29/2021  ? Procedure: BRONCHIAL BRUSHINGS;  Surgeon: Garner Nash, DO;  Location: [facility];  Service: Pulmonary;;  ? BRONCHIAL NEEDLE ASPIRATION BIOPSY  08/29/2021  ? Procedure: BRONCHIAL NEEDLE ASPIRATION BIOPSIES;  Surgeon: Garner Nash, DO;  Location: [facility];  Service: Pulmonary;;  ? CARDIAC CATHETERIZATION    ? 2014  ? DILATION AND CURETTAGE OF UTERUS    ? EYE SURGERY Left   ? X 2  ? VIDEO BRONCHOSCOPY WITH RADIAL ENDOBRONCHIAL  ULTRASOUND  08/29/2021  ? Procedure: RADIAL ENDOBRONCHIAL ULTRASOUND;  Surgeon: Garner Nash, DO;  Location: [facility];  Service: Pulmonary;;  ? ? ?MEDICATIONS: ? atorvastatin (LIPITOR) 80 MG tablet  ? benazepril (

## 2021-09-28 ENCOUNTER — Other Ambulatory Visit: Payer: Self-pay

## 2021-09-28 ENCOUNTER — Inpatient Hospital Stay (HOSPITAL_COMMUNITY): Payer: Medicare HMO

## 2021-09-28 ENCOUNTER — Encounter (HOSPITAL_COMMUNITY)
Admission: RE | Disposition: A | Discharge status: Home / Self Care | Payer: Self-pay | Source: Home / Self Care | Attending: Thoracic Surgery (Cardiothoracic Vascular Surgery)

## 2021-09-28 ENCOUNTER — Encounter (HOSPITAL_COMMUNITY): Payer: Self-pay | Admitting: Thoracic Surgery (Cardiothoracic Vascular Surgery)

## 2021-09-28 ENCOUNTER — Inpatient Hospital Stay (HOSPITAL_COMMUNITY)
Admission: RE | Admit: 2021-09-28 | Discharge: 2021-10-04 | DRG: 164 | Disposition: A | Discharge status: Home / Self Care | Payer: Medicare HMO | Attending: Thoracic Surgery (Cardiothoracic Vascular Surgery) | Admitting: Thoracic Surgery (Cardiothoracic Vascular Surgery)

## 2021-09-28 ENCOUNTER — Inpatient Hospital Stay (HOSPITAL_COMMUNITY): Payer: Medicare HMO | Admitting: Anesthesiology

## 2021-09-28 ENCOUNTER — Inpatient Hospital Stay (HOSPITAL_COMMUNITY): Payer: Medicare HMO | Admitting: Vascular Surgery

## 2021-09-28 DIAGNOSIS — Z9889 Other specified postprocedural states: Secondary | ICD-10-CM | POA: Diagnosis not present

## 2021-09-28 DIAGNOSIS — I517 Cardiomegaly: Secondary | ICD-10-CM | POA: Diagnosis not present

## 2021-09-28 DIAGNOSIS — Z20822 Contact with and (suspected) exposure to covid-19: Secondary | ICD-10-CM | POA: Diagnosis not present

## 2021-09-28 DIAGNOSIS — Z800 Family history of malignant neoplasm of digestive organs: Secondary | ICD-10-CM

## 2021-09-28 DIAGNOSIS — I10 Essential (primary) hypertension: Secondary | ICD-10-CM

## 2021-09-28 DIAGNOSIS — J95812 Postprocedural air leak: Secondary | ICD-10-CM | POA: Diagnosis not present

## 2021-09-28 DIAGNOSIS — F32A Depression, unspecified: Secondary | ICD-10-CM | POA: Diagnosis not present

## 2021-09-28 DIAGNOSIS — R7989 Other specified abnormal findings of blood chemistry: Secondary | ICD-10-CM | POA: Diagnosis not present

## 2021-09-28 DIAGNOSIS — F419 Anxiety disorder, unspecified: Secondary | ICD-10-CM | POA: Diagnosis present

## 2021-09-28 DIAGNOSIS — R079 Chest pain, unspecified: Secondary | ICD-10-CM | POA: Diagnosis not present

## 2021-09-28 DIAGNOSIS — D62 Acute posthemorrhagic anemia: Secondary | ICD-10-CM | POA: Diagnosis not present

## 2021-09-28 DIAGNOSIS — Z6832 Body mass index (BMI) 32.0-32.9, adult: Secondary | ICD-10-CM | POA: Diagnosis not present

## 2021-09-28 DIAGNOSIS — E669 Obesity, unspecified: Secondary | ICD-10-CM | POA: Diagnosis present

## 2021-09-28 DIAGNOSIS — G8929 Other chronic pain: Secondary | ICD-10-CM | POA: Diagnosis present

## 2021-09-28 DIAGNOSIS — M25512 Pain in left shoulder: Secondary | ICD-10-CM | POA: Diagnosis not present

## 2021-09-28 DIAGNOSIS — Z87891 Personal history of nicotine dependence: Secondary | ICD-10-CM

## 2021-09-28 DIAGNOSIS — C7A090 Malignant carcinoid tumor of the bronchus and lung: Secondary | ICD-10-CM | POA: Diagnosis not present

## 2021-09-28 DIAGNOSIS — Z902 Acquired absence of lung [part of]: Principal | ICD-10-CM

## 2021-09-28 DIAGNOSIS — Z888 Allergy status to other drugs, medicaments and biological substances status: Secondary | ICD-10-CM

## 2021-09-28 DIAGNOSIS — E785 Hyperlipidemia, unspecified: Secondary | ICD-10-CM | POA: Diagnosis not present

## 2021-09-28 DIAGNOSIS — I700 Atherosclerosis of aorta: Secondary | ICD-10-CM | POA: Diagnosis not present

## 2021-09-28 DIAGNOSIS — E119 Type 2 diabetes mellitus without complications: Secondary | ICD-10-CM | POA: Diagnosis present

## 2021-09-28 DIAGNOSIS — Z79899 Other long term (current) drug therapy: Secondary | ICD-10-CM | POA: Diagnosis not present

## 2021-09-28 DIAGNOSIS — D696 Thrombocytopenia, unspecified: Secondary | ICD-10-CM | POA: Diagnosis not present

## 2021-09-28 DIAGNOSIS — Z8249 Family history of ischemic heart disease and other diseases of the circulatory system: Secondary | ICD-10-CM | POA: Diagnosis not present

## 2021-09-28 DIAGNOSIS — Z794 Long term (current) use of insulin: Secondary | ICD-10-CM

## 2021-09-28 DIAGNOSIS — J939 Pneumothorax, unspecified: Secondary | ICD-10-CM | POA: Diagnosis not present

## 2021-09-28 DIAGNOSIS — R0989 Other specified symptoms and signs involving the circulatory and respiratory systems: Secondary | ICD-10-CM | POA: Diagnosis not present

## 2021-09-28 DIAGNOSIS — R059 Cough, unspecified: Secondary | ICD-10-CM | POA: Diagnosis not present

## 2021-09-28 DIAGNOSIS — R911 Solitary pulmonary nodule: Secondary | ICD-10-CM | POA: Diagnosis not present

## 2021-09-28 DIAGNOSIS — I959 Hypotension, unspecified: Secondary | ICD-10-CM | POA: Diagnosis not present

## 2021-09-28 DIAGNOSIS — Y838 Other surgical procedures as the cause of abnormal reaction of the patient, or of later complication, without mention of misadventure at the time of the procedure: Secondary | ICD-10-CM | POA: Diagnosis not present

## 2021-09-28 DIAGNOSIS — Z90710 Acquired absence of both cervix and uterus: Secondary | ICD-10-CM

## 2021-09-28 DIAGNOSIS — Z01818 Encounter for other preprocedural examination: Secondary | ICD-10-CM

## 2021-09-28 DIAGNOSIS — J90 Pleural effusion, not elsewhere classified: Secondary | ICD-10-CM | POA: Diagnosis not present

## 2021-09-28 DIAGNOSIS — R000 Tachycardia, unspecified: Secondary | ICD-10-CM | POA: Diagnosis not present

## 2021-09-28 DIAGNOSIS — D3A090 Benign carcinoid tumor of the bronchus and lung: Secondary | ICD-10-CM | POA: Diagnosis not present

## 2021-09-28 DIAGNOSIS — K219 Gastro-esophageal reflux disease without esophagitis: Secondary | ICD-10-CM | POA: Diagnosis present

## 2021-09-28 DIAGNOSIS — Z9049 Acquired absence of other specified parts of digestive tract: Secondary | ICD-10-CM | POA: Diagnosis not present

## 2021-09-28 DIAGNOSIS — M549 Dorsalgia, unspecified: Secondary | ICD-10-CM | POA: Diagnosis present

## 2021-09-28 DIAGNOSIS — I3139 Other pericardial effusion (noninflammatory): Secondary | ICD-10-CM | POA: Diagnosis present

## 2021-09-28 DIAGNOSIS — C3432 Malignant neoplasm of lower lobe, left bronchus or lung: Secondary | ICD-10-CM | POA: Diagnosis not present

## 2021-09-28 DIAGNOSIS — Z4682 Encounter for fitting and adjustment of non-vascular catheter: Secondary | ICD-10-CM | POA: Diagnosis not present

## 2021-09-28 HISTORY — PX: LYMPH NODE DISSECTION: SHX5087

## 2021-09-28 HISTORY — PX: INTERCOSTAL NERVE BLOCK: SHX5021

## 2021-09-28 LAB — GLUCOSE, CAPILLARY
Glucose-Capillary: 89 mg/dL (ref 70–99)
Glucose-Capillary: 155 mg/dL — ABNORMAL HIGH (ref 70–99)
Glucose-Capillary: 149 mg/dL — ABNORMAL HIGH (ref 70–99)
Glucose-Capillary: 121 mg/dL — ABNORMAL HIGH (ref 70–99)
Glucose-Capillary: 143 mg/dL — ABNORMAL HIGH (ref 70–99)

## 2021-09-28 LAB — SARS CORONAVIRUS 2 (TAT 6-24 HRS): SARS Coronavirus 2: NEGATIVE

## 2021-09-28 LAB — ABO/RH: ABO/RH(D): A POS

## 2021-09-28 LAB — PREPARE RBC (CROSSMATCH)

## 2021-09-28 SURGERY — LOBECTOMY, LUNG, ROBOT-ASSISTED, USING VATS
Anesthesia: General | Site: Chest | Laterality: Left

## 2021-09-28 MED ORDER — LIDOCAINE HCL (CARDIAC) PF 100 MG/5ML IV SOSY
PREFILLED_SYRINGE | INTRAVENOUS | Status: DC | PRN
  Administered 2021-09-28: 60 mg via INTRAVENOUS

## 2021-09-28 MED ORDER — KETOROLAC TROMETHAMINE 15 MG/ML IJ SOLN
15.0000 mg | Freq: Four times a day (QID) | INTRAMUSCULAR | Status: AC
  Administered 2021-09-28 – 2021-09-30 (×7): 15 mg via INTRAVENOUS
  Filled 2021-09-28 (×7): qty 1

## 2021-09-28 MED ORDER — SUGAMMADEX SODIUM 200 MG/2ML IV SOLN
INTRAVENOUS | Status: DC | PRN
  Administered 2021-09-28: 200 mg via INTRAVENOUS

## 2021-09-28 MED ORDER — ONDANSETRON HCL 4 MG/2ML IJ SOLN
INTRAMUSCULAR | Status: AC
  Filled 2021-09-28: qty 2

## 2021-09-28 MED ORDER — ATORVASTATIN CALCIUM 80 MG PO TABS
80.0000 mg | ORAL_TABLET | Freq: Every evening | ORAL | Status: DC
  Administered 2021-09-28 – 2021-10-03 (×6): 80 mg via ORAL
  Filled 2021-09-28 (×6): qty 1

## 2021-09-28 MED ORDER — CHLORHEXIDINE GLUCONATE 0.12 % MT SOLN
15.0000 mL | Freq: Once | OROMUCOSAL | Status: AC
  Administered 2021-09-28: 15 mL via OROMUCOSAL
  Filled 2021-09-28: qty 15

## 2021-09-28 MED ORDER — ALBUTEROL SULFATE (2.5 MG/3ML) 0.083% IN NEBU: 2.5000 mg | INHALATION_SOLUTION | RESPIRATORY_TRACT | Status: DC

## 2021-09-28 MED ORDER — ALBUMIN HUMAN 5 % IV SOLN
12.5000 g | Freq: Once | INTRAVENOUS | Status: AC
  Administered 2021-09-28: 12.5 g via INTRAVENOUS

## 2021-09-28 MED ORDER — DEXAMETHASONE SODIUM PHOSPHATE 10 MG/ML IJ SOLN
INTRAMUSCULAR | Status: DC | PRN
  Administered 2021-09-28: 10 mg via INTRAVENOUS

## 2021-09-28 MED ORDER — FENTANYL CITRATE (PF) 100 MCG/2ML IJ SOLN
25.0000 ug | INTRAMUSCULAR | Status: DC | PRN
  Administered 2021-09-28 (×5): 25 ug via INTRAVENOUS

## 2021-09-28 MED ORDER — BISACODYL 5 MG PO TBEC
10.0000 mg | DELAYED_RELEASE_TABLET | Freq: Every day | ORAL | Status: DC
  Administered 2021-09-28 – 2021-10-04 (×5): 10 mg via ORAL
  Filled 2021-09-28 (×6): qty 2

## 2021-09-28 MED ORDER — GABAPENTIN 300 MG PO CAPS
300.0000 mg | ORAL_CAPSULE | Freq: Three times a day (TID) | ORAL | Status: DC
  Administered 2021-09-28 – 2021-10-04 (×18): 300 mg via ORAL
  Filled 2021-09-28 (×18): qty 1

## 2021-09-28 MED ORDER — PROPOFOL 10 MG/ML IV BOLUS
INTRAVENOUS | Status: DC | PRN
  Administered 2021-09-28: 110 mg via INTRAVENOUS

## 2021-09-28 MED ORDER — ORAL CARE MOUTH RINSE: 15.0000 mL | Freq: Once | OROMUCOSAL | Status: AC

## 2021-09-28 MED ORDER — LACTATED RINGERS IV SOLN: INTRAVENOUS | Status: DC

## 2021-09-28 MED ORDER — FENTANYL CITRATE (PF) 250 MCG/5ML IJ SOLN
INTRAMUSCULAR | Status: DC | PRN
  Administered 2021-09-28: 50 ug via INTRAVENOUS
  Administered 2021-09-28: 100 ug via INTRAVENOUS

## 2021-09-28 MED ORDER — LACTATED RINGERS IV SOLN
INTRAVENOUS | Status: DC | PRN
Start: 2021-09-28 — End: 2021-09-28

## 2021-09-28 MED ORDER — BUPIVACAINE LIPOSOME 1.3 % IJ SUSP
INTRAMUSCULAR | Status: AC
  Filled 2021-09-28: qty 20

## 2021-09-28 MED ORDER — INSULIN ASPART 100 UNIT/ML IJ SOLN
0.0000 [IU] | Freq: Four times a day (QID) | INTRAMUSCULAR | Status: DC
  Administered 2021-09-28 – 2021-10-02 (×6): 2 [IU] via SUBCUTANEOUS

## 2021-09-28 MED ORDER — EPHEDRINE 5 MG/ML INJ
INTRAVENOUS | Status: AC
  Filled 2021-09-28: qty 5

## 2021-09-28 MED ORDER — BENAZEPRIL HCL 5 MG PO TABS: 5.0000 mg | ORAL_TABLET | Freq: Every evening | ORAL | Status: DC

## 2021-09-28 MED ORDER — PHENYLEPHRINE 40 MCG/ML (10ML) SYRINGE FOR IV PUSH (FOR BLOOD PRESSURE SUPPORT)
PREFILLED_SYRINGE | INTRAVENOUS | Status: DC | PRN
  Administered 2021-09-28 (×2): 40 ug via INTRAVENOUS
  Administered 2021-09-28 (×2): 80 ug via INTRAVENOUS

## 2021-09-28 MED ORDER — ACETAMINOPHEN 500 MG PO TABS
1000.0000 mg | ORAL_TABLET | Freq: Four times a day (QID) | ORAL | Status: AC
  Administered 2021-09-28 – 2021-10-03 (×15): 1000 mg via ORAL
  Filled 2021-09-28 (×18): qty 2

## 2021-09-28 MED ORDER — EPHEDRINE SULFATE (PRESSORS) 50 MG/ML IJ SOLN
INTRAMUSCULAR | Status: DC | PRN
  Administered 2021-09-28: 5 mg via INTRAVENOUS

## 2021-09-28 MED ORDER — ACETAMINOPHEN 500 MG PO TABS: 1000.0000 mg | ORAL_TABLET | Freq: Once | ORAL | Status: DC | PRN

## 2021-09-28 MED ORDER — MIDAZOLAM HCL 2 MG/2ML IJ SOLN
INTRAMUSCULAR | Status: AC
  Filled 2021-09-28: qty 2

## 2021-09-28 MED ORDER — INSULIN ASPART 100 UNIT/ML IJ SOLN: 0.0000 [IU] | INTRAMUSCULAR | Status: DC | PRN

## 2021-09-28 MED ORDER — CITALOPRAM HYDROBROMIDE 20 MG PO TABS
40.0000 mg | ORAL_TABLET | Freq: Every evening | ORAL | Status: DC
  Administered 2021-09-29 – 2021-10-03 (×5): 40 mg via ORAL
  Filled 2021-09-28 (×5): qty 2

## 2021-09-28 MED ORDER — ONDANSETRON HCL 4 MG/2ML IJ SOLN
INTRAMUSCULAR | Status: DC | PRN
  Administered 2021-09-28: 4 mg via INTRAVENOUS

## 2021-09-28 MED ORDER — FENTANYL CITRATE (PF) 250 MCG/5ML IJ SOLN
INTRAMUSCULAR | Status: AC
  Filled 2021-09-28: qty 5

## 2021-09-28 MED ORDER — CEFAZOLIN SODIUM-DEXTROSE 2-4 GM/100ML-% IV SOLN
2.0000 g | Freq: Three times a day (TID) | INTRAVENOUS | Status: AC
  Administered 2021-09-28 (×2): 2 g via INTRAVENOUS
  Filled 2021-09-28 (×2): qty 100

## 2021-09-28 MED ORDER — ACETAMINOPHEN 10 MG/ML IV SOLN
INTRAVENOUS | Status: AC
  Filled 2021-09-28: qty 100

## 2021-09-28 MED ORDER — FENTANYL CITRATE (PF) 100 MCG/2ML IJ SOLN
INTRAMUSCULAR | Status: AC
  Filled 2021-09-28: qty 2

## 2021-09-28 MED ORDER — OXYCODONE HCL 5 MG PO TABS: 5.0000 mg | ORAL_TABLET | Freq: Once | ORAL | Status: DC | PRN

## 2021-09-28 MED ORDER — ENOXAPARIN SODIUM 40 MG/0.4ML IJ SOSY
40.0000 mg | PREFILLED_SYRINGE | Freq: Every day | INTRAMUSCULAR | Status: DC
  Administered 2021-09-28 – 2021-10-04 (×7): 40 mg via SUBCUTANEOUS
  Filled 2021-09-28 (×7): qty 0.4

## 2021-09-28 MED ORDER — SODIUM CHLORIDE FLUSH 0.9 % IV SOLN
INTRAVENOUS | Status: DC | PRN
  Administered 2021-09-28: 100 mL

## 2021-09-28 MED ORDER — SODIUM CHLORIDE 0.9% IV SOLUTION: Freq: Once | INTRAVENOUS | Status: DC

## 2021-09-28 MED ORDER — PHENYLEPHRINE 40 MCG/ML (10ML) SYRINGE FOR IV PUSH (FOR BLOOD PRESSURE SUPPORT)
PREFILLED_SYRINGE | INTRAVENOUS | Status: AC
  Filled 2021-09-28: qty 20

## 2021-09-28 MED ORDER — PHENYLEPHRINE HCL-NACL 20-0.9 MG/250ML-% IV SOLN: INTRAVENOUS | Status: DC | PRN

## 2021-09-28 MED ORDER — ALBUTEROL SULFATE (2.5 MG/3ML) 0.083% IN NEBU
2.5000 mg | INHALATION_SOLUTION | RESPIRATORY_TRACT | Status: DC
  Administered 2021-09-28 – 2021-09-29 (×5): 2.5 mg via RESPIRATORY_TRACT
  Filled 2021-09-28 (×6): qty 3

## 2021-09-28 MED ORDER — BUPIVACAINE HCL (PF) 0.5 % IJ SOLN
INTRAMUSCULAR | Status: AC
  Filled 2021-09-28: qty 30

## 2021-09-28 MED ORDER — 0.9 % SODIUM CHLORIDE (POUR BTL) OPTIME
TOPICAL | Status: DC | PRN
  Administered 2021-09-28: 2000 mL

## 2021-09-28 MED ORDER — HEMOSTATIC AGENTS (NO CHARGE) OPTIME
TOPICAL | Status: DC | PRN
  Administered 2021-09-28: 1 via TOPICAL

## 2021-09-28 MED ORDER — PHENYLEPHRINE HCL-NACL 20-0.9 MG/250ML-% IV SOLN
INTRAVENOUS | Status: DC | PRN
  Administered 2021-09-28: 30 ug/min via INTRAVENOUS

## 2021-09-28 MED ORDER — PANTOPRAZOLE SODIUM 40 MG PO TBEC
40.0000 mg | DELAYED_RELEASE_TABLET | Freq: Every evening | ORAL | Status: DC
  Administered 2021-09-28 – 2021-10-03 (×6): 40 mg via ORAL
  Filled 2021-09-28 (×6): qty 1

## 2021-09-28 MED ORDER — DEXAMETHASONE SODIUM PHOSPHATE 10 MG/ML IJ SOLN
INTRAMUSCULAR | Status: AC
  Filled 2021-09-28: qty 1

## 2021-09-28 MED ORDER — TRAMADOL HCL 50 MG PO TABS
50.0000 mg | ORAL_TABLET | Freq: Four times a day (QID) | ORAL | Status: DC | PRN
  Administered 2021-09-29 – 2021-09-30 (×2): 50 mg via ORAL
  Filled 2021-09-28 (×2): qty 1

## 2021-09-28 MED ORDER — ONDANSETRON HCL 4 MG/2ML IJ SOLN: 4.0000 mg | Freq: Four times a day (QID) | INTRAMUSCULAR | Status: DC | PRN

## 2021-09-28 MED ORDER — ACETAMINOPHEN 10 MG/ML IV SOLN: 1000.0000 mg | Freq: Once | INTRAVENOUS | Status: DC | PRN

## 2021-09-28 MED ORDER — ALBUMIN HUMAN 5 % IV SOLN
INTRAVENOUS | Status: AC
Start: 2021-09-28 — End: 2021-09-28
  Filled 2021-09-28: qty 250

## 2021-09-28 MED ORDER — CYCLOSPORINE 0.05 % OP EMUL
1.0000 [drp] | Freq: Two times a day (BID) | OPHTHALMIC | Status: DC
  Administered 2021-09-28 – 2021-10-04 (×13): 1 [drp] via OPHTHALMIC
  Filled 2021-09-28 (×13): qty 30

## 2021-09-28 MED ORDER — LIDOCAINE 2% (20 MG/ML) 5 ML SYRINGE
INTRAMUSCULAR | Status: AC
  Filled 2021-09-28: qty 5

## 2021-09-28 MED ORDER — SENNOSIDES-DOCUSATE SODIUM 8.6-50 MG PO TABS
1.0000 | ORAL_TABLET | Freq: Every day | ORAL | Status: DC
  Filled 2021-09-28: qty 1

## 2021-09-28 MED ORDER — FAMOTIDINE 20 MG PO TABS
20.0000 mg | ORAL_TABLET | Freq: Every day | ORAL | Status: DC
  Administered 2021-09-28 – 2021-10-03 (×6): 20 mg via ORAL
  Filled 2021-09-28 (×6): qty 1

## 2021-09-28 MED ORDER — PROPOFOL 10 MG/ML IV BOLUS
INTRAVENOUS | Status: AC
  Filled 2021-09-28: qty 20

## 2021-09-28 MED ORDER — ACETAMINOPHEN 10 MG/ML IV SOLN
INTRAVENOUS | Status: DC | PRN
  Administered 2021-09-28: 1000 mg via INTRAVENOUS

## 2021-09-28 MED ORDER — OXYCODONE HCL 5 MG/5ML PO SOLN: 5.0000 mg | Freq: Once | ORAL | Status: DC | PRN

## 2021-09-28 MED ORDER — ROCURONIUM BROMIDE 100 MG/10ML IV SOLN
INTRAVENOUS | Status: DC | PRN
  Administered 2021-09-28: 30 mg via INTRAVENOUS
  Administered 2021-09-28 (×2): 20 mg via INTRAVENOUS
  Administered 2021-09-28: 70 mg via INTRAVENOUS

## 2021-09-28 MED ORDER — MIDAZOLAM HCL 5 MG/5ML IJ SOLN
INTRAMUSCULAR | Status: DC | PRN
  Administered 2021-09-28: 1 mg via INTRAVENOUS

## 2021-09-28 MED ORDER — CEFAZOLIN SODIUM-DEXTROSE 2-4 GM/100ML-% IV SOLN
2.0000 g | INTRAVENOUS | Status: AC
  Administered 2021-09-28: 2 g via INTRAVENOUS
  Filled 2021-09-28: qty 100

## 2021-09-28 MED ORDER — ACETAMINOPHEN 160 MG/5ML PO SOLN: 1000.0000 mg | Freq: Once | ORAL | Status: DC | PRN

## 2021-09-28 MED ORDER — ROCURONIUM BROMIDE 10 MG/ML (PF) SYRINGE
PREFILLED_SYRINGE | INTRAVENOUS | Status: AC
  Filled 2021-09-28: qty 20

## 2021-09-28 MED ORDER — ACETAMINOPHEN 160 MG/5ML PO SOLN: 1000.0000 mg | Freq: Four times a day (QID) | ORAL | Status: AC

## 2021-09-28 SURGICAL SUPPLY — 117 items
BLADE CLIPPER SURG (BLADE) ×1 IMPLANT
BLADE SURG 11 STRL SS (BLADE) ×2 IMPLANT
BNDG COHESIVE 6X5 TAN STRL LF (GAUZE/BANDAGES/DRESSINGS) ×2 IMPLANT
CANISTER SUCT 3000ML PPV (MISCELLANEOUS) ×4 IMPLANT
CANNULA REDUC XI 12-8 STAPL (CANNULA) ×2
CANNULA REDUCER 12-8 DVNC XI (CANNULA) ×2 IMPLANT
CATH TROCAR 20FR (CATHETERS) IMPLANT
CHLORAPREP W/TINT 26 (MISCELLANEOUS) ×2 IMPLANT
CLIP VESOCCLUDE MED 6/CT (CLIP) IMPLANT
CNTNR URN SCR LID CUP LEK RST (MISCELLANEOUS) ×5 IMPLANT
CONN ST 1/4X3/8  BEN (MISCELLANEOUS)
CONN ST 1/4X3/8 BEN (MISCELLANEOUS) IMPLANT
CONT SPEC 4OZ STRL OR WHT (MISCELLANEOUS) ×8
DEFOGGER SCOPE WARMER CLEARIFY (MISCELLANEOUS) ×2 IMPLANT
DERMABOND ADHESIVE PROPEN (GAUZE/BANDAGES/DRESSINGS) ×1
DERMABOND ADVANCED (GAUZE/BANDAGES/DRESSINGS) ×1
DERMABOND ADVANCED .7 DNX12 (GAUZE/BANDAGES/DRESSINGS) ×1 IMPLANT
DERMABOND ADVANCED .7 DNX6 (GAUZE/BANDAGES/DRESSINGS) IMPLANT
DISSECTOR BLUNT TIP ENDO 5MM (MISCELLANEOUS) IMPLANT
DRAIN CHANNEL 28F RND 3/8 FF (WOUND CARE) IMPLANT
DRAPE ARM DVNC X/XI (DISPOSABLE) ×4 IMPLANT
DRAPE COLUMN DVNC XI (DISPOSABLE) ×1 IMPLANT
DRAPE CV SPLIT W-CLR ANES SCRN (DRAPES) ×2 IMPLANT
DRAPE DA VINCI XI ARM (DISPOSABLE) ×4
DRAPE DA VINCI XI COLUMN (DISPOSABLE) ×1
DRAPE ORTHO SPLIT 77X108 STRL (DRAPES) ×1
DRAPE SURG ORHT 6 SPLT 77X108 (DRAPES) ×1 IMPLANT
ELECT BLADE 6.5 EXT (BLADE) IMPLANT
ELECT REM PT RETURN 9FT ADLT (ELECTROSURGICAL) ×2
ELECTRODE REM PT RTRN 9FT ADLT (ELECTROSURGICAL) ×1 IMPLANT
GAUZE 4X4 16PLY ~~LOC~~+RFID DBL (SPONGE) ×1 IMPLANT
GAUZE KITTNER 4X8 (MISCELLANEOUS) ×2 IMPLANT
GAUZE SPONGE 4X4 12PLY STRL (GAUZE/BANDAGES/DRESSINGS) ×2 IMPLANT
GLOVE SURG ENC MOIS LTX SZ7.5 (GLOVE) ×4 IMPLANT
GLOVE SURG POLYISO LF SZ8 (GLOVE) ×2 IMPLANT
GOWN STRL REUS W/ TWL LRG LVL3 (GOWN DISPOSABLE) ×2 IMPLANT
GOWN STRL REUS W/ TWL XL LVL3 (GOWN DISPOSABLE) ×3 IMPLANT
GOWN STRL REUS W/TWL 2XL LVL3 (GOWN DISPOSABLE) ×2 IMPLANT
GOWN STRL REUS W/TWL LRG LVL3 (GOWN DISPOSABLE) ×2
GOWN STRL REUS W/TWL XL LVL3 (GOWN DISPOSABLE) ×3
HANDLE STAPLE  ENDO EGIA 4 STD (STAPLE) ×1
HANDLE STAPLE ENDO EGIA 4 STD (STAPLE) IMPLANT
HEMOSTAT SURGICEL 2X14 (HEMOSTASIS) ×6 IMPLANT
IRRIGATION STRYKERFLOW (MISCELLANEOUS) IMPLANT
IRRIGATOR STRYKERFLOW (MISCELLANEOUS)
KIT BASIN OR (CUSTOM PROCEDURE TRAY) ×2 IMPLANT
KIT TURNOVER KIT B (KITS) ×2 IMPLANT
NEEDLE 22X1 1/2 (OR ONLY) (NEEDLE) ×2 IMPLANT
NS IRRIG 1000ML POUR BTL (IV SOLUTION) ×6 IMPLANT
PACK CHEST (CUSTOM PROCEDURE TRAY) ×2 IMPLANT
PAD ARMBOARD 7.5X6 YLW CONV (MISCELLANEOUS) ×10 IMPLANT
PORT ACCESS TROCAR AIRSEAL 12 (TROCAR) ×1 IMPLANT
PORT ACCESS TROCAR AIRSEAL 5M (TROCAR) ×1
POUCH ENDO CATCH II 15MM (MISCELLANEOUS) IMPLANT
RELOAD EGIA 45 MED/THCK PURPLE (STAPLE) ×1 IMPLANT
RELOAD STAPLE 45 2.5 WHT DVNC (STAPLE) IMPLANT
RELOAD STAPLE 45 3.5 BLU DVNC (STAPLE) IMPLANT
RELOAD STAPLE 45 4.3 GRN DVNC (STAPLE) IMPLANT
RELOAD STAPLER 2.5X45 WHT DVNC (STAPLE) ×3 IMPLANT
RELOAD STAPLER 3.5X45 BLU DVNC (STAPLE) ×8 IMPLANT
RELOAD STAPLER 4.3X45 GRN DVNC (STAPLE) ×6 IMPLANT
RETRACTOR WOUND ALXS 19CM XSML (INSTRUMENTS) IMPLANT
RTRCTR WOUND ALEXIS 19CM XSML (INSTRUMENTS)
SCISSORS LAP 5X35 DISP (ENDOMECHANICALS) IMPLANT
SEAL CANN UNIV 5-8 DVNC XI (MISCELLANEOUS) ×2 IMPLANT
SEAL XI 5MM-8MM UNIVERSAL (MISCELLANEOUS) ×2
SEALANT PROGEL (MISCELLANEOUS) IMPLANT
SEALER LIGASURE MARYLAND 30 (ELECTROSURGICAL) IMPLANT
SET TRI-LUMEN FLTR TB AIRSEAL (TUBING) ×2 IMPLANT
SOLUTION ELECTROLUBE (MISCELLANEOUS) IMPLANT
SPONGE INTESTINAL PEANUT (DISPOSABLE) IMPLANT
SPONGE T-LAP 18X18 ~~LOC~~+RFID (SPONGE) ×5 IMPLANT
SPONGE TONSIL TAPE 1 RFD (DISPOSABLE) IMPLANT
STAPLER 45 DA VINCI SURE FORM (STAPLE) ×1
STAPLER 45 SUREFORM CVD (STAPLE) ×1
STAPLER 45 SUREFORM CVD DVNC (STAPLE) IMPLANT
STAPLER 45 SUREFORM DVNC (STAPLE) IMPLANT
STAPLER CANNULA SEAL DVNC XI (STAPLE) ×2 IMPLANT
STAPLER CANNULA SEAL XI (STAPLE) ×2
STAPLER RELOAD 2.5X45 WHITE (STAPLE) ×3
STAPLER RELOAD 2.5X45 WHT DVNC (STAPLE) ×3
STAPLER RELOAD 3.5X45 BLU DVNC (STAPLE) ×8
STAPLER RELOAD 3.5X45 BLUE (STAPLE) ×8
STAPLER RELOAD 4.3X45 GREEN (STAPLE) ×6
STAPLER RELOAD 4.3X45 GRN DVNC (STAPLE) ×6
STOPCOCK 4 WAY LG BORE MALE ST (IV SETS) ×2 IMPLANT
SUT MNCRL AB 3-0 PS2 18 (SUTURE) ×2 IMPLANT
SUT MON AB 2-0 CT1 36 (SUTURE) IMPLANT
SUT PDS AB 1 CTX 36 (SUTURE) IMPLANT
SUT PROLENE 4 0 RB 1 (SUTURE)
SUT PROLENE 4-0 RB1 .5 CRCL 36 (SUTURE) IMPLANT
SUT SILK  1 MH (SUTURE) ×1
SUT SILK 1 MH (SUTURE) ×1 IMPLANT
SUT SILK 1 TIES 10X30 (SUTURE) ×1 IMPLANT
SUT SILK 2 0 SH (SUTURE) ×1 IMPLANT
SUT SILK 2 0SH CR/8 30 (SUTURE) IMPLANT
SUT VIC AB 1 CTX 36 (SUTURE)
SUT VIC AB 1 CTX36XBRD ANBCTR (SUTURE) IMPLANT
SUT VIC AB 2-0 CT1 27 (SUTURE) ×1
SUT VIC AB 2-0 CT1 TAPERPNT 27 (SUTURE) ×1 IMPLANT
SUT VIC AB 3-0 SH 27 (SUTURE) ×2
SUT VIC AB 3-0 SH 27X BRD (SUTURE) ×2 IMPLANT
SUT VICRYL 0 TIES 12 18 (SUTURE) ×2 IMPLANT
SUT VICRYL 0 UR6 27IN ABS (SUTURE) ×4 IMPLANT
SUT VICRYL 2 TP 1 (SUTURE) IMPLANT
SYR 10ML LL (SYRINGE) ×2 IMPLANT
SYR 20ML LL LF (SYRINGE) ×2 IMPLANT
SYR 50ML LL SCALE MARK (SYRINGE) ×2 IMPLANT
SYSTEM RETRIEVAL ANCHOR 15 (MISCELLANEOUS) ×1 IMPLANT
SYSTEM RETRIEVAL ANCHOR 8 (MISCELLANEOUS) ×1 IMPLANT
SYSTEM SAHARA CHEST DRAIN ATS (WOUND CARE) ×2 IMPLANT
TAPE CLOTH 4X10 WHT NS (GAUZE/BANDAGES/DRESSINGS) ×2 IMPLANT
TIP APPLICATOR SPRAY EXTEND 16 (VASCULAR PRODUCTS) IMPLANT
TOWEL GREEN STERILE (TOWEL DISPOSABLE) ×2 IMPLANT
TRAY FOLEY MTR SLVR 14FR STAT (SET/KITS/TRAYS/PACK) ×1 IMPLANT
TRAY FOLEY MTR SLVR 16FR STAT (SET/KITS/TRAYS/PACK) ×1 IMPLANT
TUBING EXTENTION W/L.L. (IV SETS) ×2 IMPLANT

## 2021-09-28 NOTE — Anesthesia Procedure Notes (Addendum)
Procedure Name: Intubation ?Date/Time: 09/28/2021 7:47 AM ?Performed by: Lorie Phenix, CRNA ?Pre-anesthesia Checklist: Patient identified, Emergency Drugs available, Suction available and Patient being monitored ?Patient Re-evaluated:Patient Re-evaluated prior to induction ?Oxygen Delivery Method: Circle system utilized ?Preoxygenation: Pre-oxygenation with 100% oxygen ?Induction Type: IV induction ?Ventilation: Mask ventilation without difficulty ?Laryngoscope Size: Mac and 3 ?Grade View: Grade I ?Tube type: Oral ?Endobronchial tube: Double lumen EBT, Left and EBT position confirmed by fiberoptic bronchoscope and 35 Fr ?Number of attempts: 1 ?Airway Equipment and Method: Stylet ?Placement Confirmation: ETT inserted through vocal cords under direct vision, positive ETCO2 and breath sounds checked- equal and bilateral ?Secured at: 27 (at the teeth) cm ?Tube secured with: Tape ?Dental Injury: Teeth and Oropharynx as per pre-operative assessment  ? ? ? ? ?

## 2021-09-28 NOTE — TOC Progression Note (Signed)
Transition of Care (TOC) - Progression Note  ? ? ?Patient Details  ?Name: Rashad Obeid ?MRN: [id number] ?Date of Birth: 1950-07-29 ? ?Transition of Care (TOC) CM/SW Contact  ?Angelita Ingles, RN ?Phone Number:[phone] ? ?09/28/2021, 3:45 PM ? ?Clinical Narrative:    ? ?Transition of Care (TOC) Screening Note ? ? ?Patient Details  ?Name: Kattleya Kuhnert ?Date of Birth: 17-Jan-1951 ? ? ?Transition of Care (TOC) CM/SW Contact:    ?Angelita Ingles, RN ?Phone Number: ?09/28/2021, 3:45 PM ? ? ? ?Transition of Care Department [facility]) has reviewed patient and no TOC needs have been identified at this time. We will continue to monitor patient advancement through interdisciplinary progression rounds. If new patient transition needs arise, please place a TOC consult. ? ? ? ? ?  ?  ? ?Expected Discharge Plan and Services ?  ?  ?  ?  ?  ?                ?  ?  ?  ?  ?  ?  ?  ?  ?  ?  ? ? ?Social Determinants of Health (SDOH) Interventions ?  ? ?Readmission Risk Interventions ?   ? View : No data to display.  ?  ?  ?  ? ? ?

## 2021-09-28 NOTE — Discharge Instructions (Signed)
Robot-Assisted Thoracic Surgery, Care After ?The following information offers guidance on how to care for yourself after your procedure. Your health care provider may also give you more specific instructions. If you have problems or questions, contact your health care provider. ?What can I expect after the procedure? ?After the procedure, it is common to have: ?Some pain and aches in the area of your surgical incisions. ?Pain when breathing in (inhaling) and coughing. ?Tiredness (fatigue). ?Trouble sleeping. ?Constipation. ?Follow these instructions at home: ?Medicines ?Take over-the-counter and prescription medicines only as told by your health care provider. ?If you were prescribed an antibiotic medicine, take it as told by your health care provider. Do not stop taking the antibiotic even if you start to feel better. ?Talk with your health care provider about safe and effective ways to manage pain after your procedure. Pain management should fit your specific health needs. ?Take pain medicine before pain becomes severe. Relieving and controlling your pain will make breathing easier for you. ?Ask your health care provider if the medicine prescribed to you requires you to avoid driving or using machinery. ?Eating and drinking ?Follow instructions from your health care provider about eating or drinking restrictions. These will vary depending on what procedure you had. Your health care provider may recommend: ?A liquid diet or soft diet for the first few days. ?Meals that are smaller and more frequent. ?A diet of fruits, vegetables, whole grains, and low-fat proteins. ?Limiting foods that are high in fat and processed sugar, including fried or sweet foods. ?Incision care ?Follow instructions from your health care provider about how to take care of your incisions. Make sure you: ?Wash your hands with soap and water for at least 20 seconds before and after you change your bandage (dressing). If soap and water are not  available, use hand sanitizer. ?Change your dressing as told by your health care provider. ?Leave stitches (sutures), skin glue, or adhesive strips in place. These skin closures may need to stay in place for 2 weeks or longer. If adhesive strip edges start to loosen and curl up, you may trim the loose edges. Do not remove adhesive strips completely unless your health care provider tells you to do that. ?Check your incision area every day for signs of infection. Check for: ?Redness, swelling, or more pain. ?Fluid or blood. ?Warmth. ?Pus or a bad smell. ?Activity ?Return to your normal activities as told by your health care provider. Ask your health care provider what activities are safe for you. ?Ask your health care provider when it is safe for you to drive. ?Do not lift anything that is heavier than 10 lb (4.5 kg), or the limit that you are told, until your health care provider says that it is safe. ?Rest as told by your health care provider. ?Avoid sitting for a long time without moving. Get up to take short walks every 1-2 hours. This is important to improve blood flow and breathing. Ask for help if you feel weak or unsteady. ?Do exercises as told by your health care provider. ?Pneumonia prevention ?Do deep breathing exercises and cough regularly as directed. This helps clear mucus and opens your lungs. Doing this helps prevent lung infection (pneumonia). ?If you were given an incentive spirometer, use it as told. An incentive spirometer is a tool that measures how well you are filling your lungs with each breath. ?Coughing may hurt less if you try to support your chest. This is called splinting. Try one of these when you cough: ?  Hold a pillow against your chest. ?Place the palms of both hands on top of your incision area. ?Do not use any products that contain nicotine or tobacco. These products include cigarettes, chewing tobacco, and vaping devices, such as e-cigarettes. If you need help quitting, ask your  health care provider. ?Avoid secondhand smoke. ?General instructions ?If you have a drainage tube: ?Follow instructions from your health care provider about how to take care of it. ?Do not travel by airplane after your tube is removed until your health care provider tells you it is safe. ?You may need to take these actions to prevent or treat constipation: ?Drink enough fluid to keep your urine pale yellow. ?Take over-the-counter or prescription medicines. ?Eat foods that are high in fiber, such as beans, whole grains, and fresh fruits and vegetables. ?Limit foods that are high in fat and processed sugars, such as fried or sweet foods. ?Keep all follow-up visits. This is important. ?Contact a health care provider if: ?You have redness, swelling, or more pain around an incision. ?You have fluid or blood coming from an incision. ?An incision feels warm to the touch. ?You have pus or a bad smell coming from an incision. ?You have a fever. ?You cannot eat or drink without vomiting. ?Your pain medicine is not controlling your pain. ?Get help right away if: ?You have chest pain. ?Your heart is beating quickly. ?You have trouble breathing. ?You have trouble speaking. ?You are confused. ?You feel weak or dizzy, or you faint. ?These symptoms may represent a serious problem that is an emergency. Do not wait to see if the symptoms will go away. Get medical help right away. Call your local emergency services (911 in the [place] Do not drive yourself to the hospital. ?Summary ?Talk with your health care provider about safe and effective ways to manage pain after your procedure. Pain management should fit your specific health needs. ?Return to your normal activities as told by your health care provider. Ask your health care provider what activities are safe for you. ?Do deep breathing exercises and cough regularly as directed. This helps to clear mucus and prevent pneumonia. If it hurts to cough, ease pain by holding a pillow  against your chest or by placing the palms of both hands over your incisions. ?This information is not intended to replace advice given to you by your health care provider. Make sure you discuss any questions you have with your health care provider. ?Document Revised: 03/04/2020 Document Reviewed: 03/04/2020 ?Elsevier Patient Education ? [place]. ?  ?

## 2021-09-28 NOTE — Brief Op Note (Signed)
09/28/2021 ? ?11:04 AM ? ?PATIENT:  Alexandra Berger  71 y.o. female ? ?PRE-OPERATIVE DIAGNOSIS:  CARCINOID TUMOR ? ?POST-OPERATIVE DIAGNOSIS:  CARCINOID TUMOR ? ?PROCEDURE:  Procedure(s): ?XI ROBOTIC ASSISTED THORASCOPY-LEFT LOWER LOBE WEDGE, LEFT LOWER LOBE LOBECTOMY (Left) ?INTERCOSTAL NERVE BLOCK (Left) ?LYMPH NODE DISSECTION (Left) ? ?SURGEON:  Surgeon(s) and Role: ?   * Lajuana Matte, MD - Primary ? ?PHYSICIAN ASSISTANT: Mattheu Brodersen PA-C ? ?ASSISTANTS: none  ? ?ANESTHESIA:   general ? ?EBL:  100 ML ? ?BLOOD ADMINISTERED:none ? ?DRAINS: (39 F) Blake drain(s) in the LEFT HEMITHORAX   ? ?LOCAL MEDICATIONS USED:  MARCAINE    and OTHER EXPAREL ? ?SPECIMEN:  Source of Specimen:  LEFT LOWER LOBE AND LN SAMPLES ? ?DISPOSITION OF SPECIMEN:  PATHOLOGY ? ?COUNTS:  YES ? ?TOURNIQUET:  * No tourniquets in log * ? ?DICTATION: .Dragon Dictation ? ?PLAN OF CARE: Admit to inpatient  ? ?PATIENT DISPOSITION:  PACU - hemodynamically stable. ?  ?Delay start of Pharmacological VTE agent (>24hrs) due to surgical blood loss or risk of bleeding: yes ? ?COMPLICATIONS: NO KNOWN ?

## 2021-09-28 NOTE — Op Note (Signed)
? ?   ?[address] ?      [name] [address] ?            [phone]      ? ? ?09/28/2021 ? ?Patient:  Alexandra Berger ?Pre-Op Dx: Left lower lobe carcinoid tumor. ?Post-op Dx: Same ?Procedure: ?- Robotic assisted left video thoracoscopy ?-Left lower lobe wedge resection. ?-Left lower lobectomy ?- Mediastinal lymph node sampling ?- Intercostal nerve block ? ?Surgeon and Role:   ?   * Lajuana Matte, MD - Primary ? ?Assistant: Evonnie Pat, PA-C  ?An experienced assistant was required given the complexity of this surgery and the standard of surgical care. The assistant was needed for exposure, dissection, suctioning, retraction of delicate tissues and sutures, instrument exchange and for overall help during this procedure.   ? ?Anesthesia  general ?EBL: 25 ml ?Blood Administration: None ?Specimen: Left lower lobe wedge resection x2.  Left upper lobe wedge resection.  Left lower lobe.  Hilar and mediastinal lymph nodes. ? ?Drains: 4 F argyle chest tube in left chest ?Counts: correct ? ? 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  I personally reviewed her cross-sectional imaging.  It is a fairly sizable nodule measuring 2.4 cm.  It is deep in the lung but it does appear close to the fissure.  A wedge resection may be slightly challenging without removing a fair amount of the lung.  Pulmonary function testing are acceptable.  We will plan for robotic assisted left thoracoscopy with wedge resection, possible lobectomy of the left lower lobe.  If we are unable to obtain negative margins given how deep this is in the patient will require a lobectomy ? ?Findings: ?There was discoloration and a large bullous that corresponded with the cross-sectional imaging location of the carcinoid tumor.  It was deep within the lung and obtaining wedge resection was somewhat difficult.  We attempted to resect it with several fires of the robotic stapler.  Was  passed into a Endo Catch bag and sent to pathology.  The pathologist only noted a 6 mm nodule consistent with carcinoid.  Her original tumor was 2.4 cm so I elected to proceed with a lobectomy for complete resection given that the tumor was not within the wedge resection. ? ?Operative Technique: ?After the risks, benefits and alternatives were thoroughly discussed, the patient was brought to the operative theatre.  Anesthesia was induced, and the patient was then placed in a right lateral decubitus position and was prepped and draped in normal sterile fashion.  An appropriate surgical pause was performed, and pre-operative antibiotics were dosed accordingly. ? ?We began by placing our 4 robotic ports in the the 7th intercostal space targeting the hilum of the lung.  A 61mm assistant port was placed in the 9th intercostal space in the anterior axillary line.  The robot was then docked and all instruments were passed under direct visualization.    The lung was then retracted superiorly, and the inferior pulmonary ligament was divided.  We performed a wedge resection of the lower lobe with an area that was consistent with the cross-sectional imaging as to the location of the carcinoid tumor.  The frozen section only showed a small focus of carcinoid which was not consistent with the imaging.  We then proceeded with a lobectomy.   ? ?The hilum was mobilized anteriorly and posteriorly.  We identified the lower lobe vein, and after careful isolation, it was divided with a  vascular stapler.  We next moved to the fissure.  The artery was then divided with a vascular load stapler.  The bronchus to the lower lobe was then isolated.  After a test clamp, with good ventilation of the upper lobe, the bronchus was then divided.  The fissure was completed, and the specimen was passed into an endocatch bag.  It was removed from the anterior access site.   ? ?Lymph nodes were then sampled at levels 4, 7, 9, and hilum.  The chest was  irrigated, and an air leak test was performed.  An intercostal nerve block was performed under direct visualization.  A 28 F chest tube was then placed, and we watch the remaining lobes re-expand.  The skin and soft tissue were closed with absorbable suture   ? ?The patient tolerated the procedure without any immediate complications, and was transferred to the PACU in stable condition. ? ?Lajuana Matte ? ?

## 2021-09-28 NOTE — Discharge Summary (Signed)
Physician Discharge Summary  ? ? ?   ?[address] ?      [name] [address] ?            [phone]   ? ?Patient ID: ?Alexandra Berger ?MRN: [id number] ?DOB/AGE: 71-Oct-1952 71 y.o. ? ?Admit date: 09/28/2021 ?Discharge date: 10/04/2021 ? ?Admission Diagnoses: ?Carcinoid of the left lower lobe ?Discharge Diagnoses:  ?S/p Xi robotic assisted left thoracoscopy, wedge LLL, LLL, LN dissection, and intercostal nerve block ?History of the following ?  Anxiety and depression   ? Chronic back pain    ? Diabetes mellitus without complication ([place])    ? DJD (degenerative joint disease)    ? Frequent PVCs    ? Hyperlipidemia    ? Hypertension    ? Memory difficulties   ? ? ?History of Present Illness: ? ?Patient is a 71 year old female referred to Dr. [name] by Dr. 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  Dr. [name] evaluated the patient and her relevant studies and recommended proceeding with resection.  The patient denied chest pain.  She did note some intermittent dizziness and some occasional shortness of breath.  She was admitted electively for robotic assisted thoracoscopic surgery for resection. ? ?Hospital Course: ? ?The patient was taken to the operating room and on 09/28/2021 underwent a left robotic assisted thoracoscopic surgical resection of her left lower lobe.  Additionally ,she had lymph node sampling.  She tolerated the procedure well, was extubated, and was taken to the postanesthesia care unit in stable condition. Chest tube was to water seal and there was an air leak. Chest xray showed small left apex and basilar pneumothorax.  This resolved on subsequent CXR.  Her air leak resolved and her serous output decreased.  Her chest tube was removed on 10/03/2021 after a successful 4-hour clamp trial. She is ambulating with good oxygenation on room air. She has been tolerating a diet. She has diabetes and glucose has been well controlled  with Levemir insulin. All wounds are clean, dry, and healing without signs of infection. She has some serous drainage from the chest tube exit site. She is advised to keep this covered with a dry dressing and change as needed until this dries up.  ? ?Consults: None ? ?Procedure (s):  ?Xi robotic assisted left thoracoscopy, wedge LLL, LLL, LN dissection, and intercostal nerve block by Dr. [name] on 09/28/2021. ? ?Pathology: (excerpt from full report) ?-    Typical carcinoid tumor/neuroendocrine tumor, grade 1, 21 cm in  ?greatest dimension.  ?-    All margins negative for tumor.  ?-    Twenty-three lymph nodes, presumed lobar (12L), all nodes negative for malignancy (0/23).  ? ?Latest Vital Signs: ?Blood pressure 113/61, pulse (!) 102, temperature 98.3 ?F (36.8 ?C), temperature source Oral, resp. rate 16, height 5\' 6"  (1.676 m), weight 90.7 kg, SpO2 90 %. ? ?Physical Exam: ? ?Cardiovascular: RRR ?Pulmonary: Clear to auscultation bilaterally ?Wounds: Port sites are clean and dry.  No erythema or signs of infection. Serous drainage from the ? chest tube ? ?Discharge Condition: Stable and discharged to home. ? ?Recent laboratory studies:  ?Lab Results  ?Component Value Date  ? WBC 3.7 (L) 09/30/2021  ? HGB 10.2 (L) 09/30/2021  ? HCT 31.1 (L) 09/30/2021  ? MCV 94.8 09/30/2021  ? PLT 107 (L) 09/30/2021  ? ?Lab Results  ?Component Value Date  ? NA 131 (L) 10/01/2021  ? K 3.8 10/01/2021  ?  CL 104 10/01/2021  ? CO2 22 10/01/2021  ? CREATININE 1.07 (H) 10/01/2021  ? GLUCOSE 152 (H) 10/01/2021  ? ? ?  ?Diagnostic Studies: DG Chest 2 View ? ?Result Date: 10/04/2021 ?CLINICAL DATA:  Pneumothorax. EXAM: CHEST - 2 VIEW COMPARISON:  10/03/2021 FINDINGS: Similar small left apical pneumothorax. The left chest tube seen previously has been removed in the interval. Small left pleural effusion evident. There is a tiny effusion on the right as well. Cardiopericardial silhouette is at upper limits of normal for size. Interstitial and  airspace disease at the left base is similar. IMPRESSION: 1. Similar small left apical pneumothorax status post chest tube removal. 2. Persistent interstitial and airspace disease at the left base with small bilateral pleural effusions. Electronically Signed   By: Misty Stanley M.D.   On: 10/04/2021 07:47  ? ?DG Chest 2 View ? ?Result Date: 09/28/2021 ?CLINICAL DATA:  Preop lobectomy EXAM: CHEST - 2 VIEW COMPARISON:  CT chest 08/25/2021 FINDINGS: Approximate 2 cm nodule in the left lower lobe anteriorly unchanged from prior CT. No surrounding infiltrate or effusion Heart size and vascularity normal. Right lung clear. No skeletal abnormality. IMPRESSION: Left lower lobe nodule, unchanged Electronically Signed   By: Franchot Gallo M.D.   On: 09/28/2021 12:23  ? ?DG CHEST PORT 1 VIEW ? ?Result Date: 10/03/2021 ?CLINICAL DATA:  Post lobectomy.  Follow-up pneumothorax. EXAM: PORTABLE CHEST 1 VIEW COMPARISON:  Earlier same day; 10/02/2021; 10/01/2021 FINDINGS: Unchanged cardiac silhouette and mediastinal contours with atherosclerotic plaque within the thoracic aorta. Interval retraction of apically directed left lateral chest tube, now with 2 side holes external to the left pleural space though with unchanged size and appearance of tiny left apical pneumothorax. Redemonstrated minimal left basilar heterogeneous opacities. Unchanged trace right-sided effusion. No right-sided pneumothorax. No evidence of edema. No acute osseous abnormalities. IMPRESSION: 1. Interval retraction of left-sided chest tube, now with 2 sideholes external to the left pleural space though is unchanged size and appearance of tiny left apical pneumothorax. 2. Minimal left basilar heterogeneous opacities, atelectasis versus contusion. 3. Unchanged trace left-sided effusion. Electronically Signed   By: Sandi Mariscal M.D.   On: 10/03/2021 12:21  ? ?DG CHEST PORT 1 VIEW ? ?Result Date: 10/03/2021 ?CLINICAL DATA:  Pneumothorax, left lower chest pain at chest  tube insertion site history of robotic assisted thoracoscopy with left lower lobe wedge resection EXAM: PORTABLE CHEST 1 VIEW COMPARISON:  Chest radiograph 1 day prior FINDINGS: The cardiomediastinal silhouette is stable. There is calcified atherosclerotic plaque of the aortic arch. Left apical chest tube is stable. The small left apical pneumothorax is not significantly changed in size. Postsurgical changes reflecting left lower lobectomy are again seen. Reticular opacities in the residual left lower lung are not significantly changed. The right lung is clear. There is no significant pleural effusion. There is no right pneumothorax There is no acute osseous abnormality. IMPRESSION: Stable small left apical pneumothorax with chest tube in place. Electronically Signed   By: Valetta Mole M.D.   On: 10/03/2021 08:22  ? ?DG CHEST PORT 1 VIEW ? ?Result Date: 10/02/2021 ?CLINICAL DATA:  Postoperative from left lower lobectomy on 09/28/2021. Productive cough. Small left apical pneumothorax on prior chest radiography. EXAM: PORTABLE CHEST 1 VIEW COMPARISON:  10/01/2021 FINDINGS: The left-sided chest tube remains in place. Approximally 5% left apical pneumothorax noted, best appreciated medially. Stable mild blunting of the right lateral costophrenic angle. Atherosclerotic calcification of the aortic arch. Borderline enlargement of the cardiopericardial silhouette. IMPRESSION: 1. Proximally  5% left apical pneumothorax. Left chest tube in place. 2.  Aortic Atherosclerosis (ICD10-I70.0). 3. Borderline enlargement of the cardiopericardial silhouette. 4. Minimal blunting of the right lateral costophrenic angle, cannot exclude trace right pleural effusion. Electronically Signed   By: Van Clines M.D.   On: 10/02/2021 07:18  ? ?DG CHEST PORT 1 VIEW ? ?Result Date: 10/01/2021 ?CLINICAL DATA:  71 year old female status post lobectomy. EXAM: PORTABLE CHEST 1 VIEW COMPARISON:  Chest x-ray 09/30/2021. FINDINGS: Postoperative  changes of left lower lobectomy are again noted with compensatory hyperexpansion of the left upper lobe. Left-sided chest tube remains in position with tip near the apex of the left hemithorax. Small left apical

## 2021-09-28 NOTE — Anesthesia Procedure Notes (Signed)
Arterial Line Insertion ?Start/End4/11/2021 7:30 AM, 09/28/2021 7:35 AM ?Performed by: CRNA ? Patient location: OR. ?Preanesthetic checklist: patient identified, IV checked, site marked, risks and benefits discussed, surgical consent, monitors and equipment checked, pre-op evaluation, timeout performed and anesthesia consent ?Lidocaine 1% used for infiltration ?Left, radial was placed ?Catheter size: 20 G ?Hand hygiene performed  and maximum sterile barriers used  ? ?Attempts: 1 ?Procedure performed without using ultrasound guided technique. ?Following insertion, Biopatch. ?Post procedure assessment: normal ? ?Patient tolerated the procedure well with no immediate complications. ? ? ?

## 2021-09-28 NOTE — Interval H&P Note (Signed)
History and Physical Interval Note: ? ?09/28/2021 ?7:19 AM ? ?Alexandra Berger  has presented today for surgery, with the diagnosis of CARCINOID TUMOR.  The various methods of treatment have been discussed with the patient and family. After consideration of risks, benefits and other options for treatment, the patient has consented to  Procedure(s): ?XI ROBOTIC ASSISTED THORASCOPY-LEFT LOWER LOBECTOMY (Left) as a surgical intervention.  The patient's history has been reviewed, patient examined, no change in status, stable for surgery.  I have reviewed the patient's chart and labs.  Questions were answered to the patient's satisfaction.   ? ? ?Alexandra Berger ? ? ?

## 2021-09-28 NOTE — Transfer of Care (Signed)
Immediate Anesthesia Transfer of Care Note ? ?Patient: Alexandra Berger ? ?Procedure(s) Performed: XI ROBOTIC ASSISTED THORASCOPY-LEFT LOWER LOBE WEDGE, LEFT LOWER LOBE LOBECTOMY (Left: Chest) ?INTERCOSTAL NERVE BLOCK (Left: Chest) ?LYMPH NODE DISSECTION (Left: Chest) ? ?Patient Location: PACU ? ?Anesthesia Type:General ? ?Level of Consciousness: awake and drowsy ? ?Airway & Oxygen Therapy: Patient Spontanous Breathing and Patient connected to face mask oxygen ? ?Post-op Assessment: Report given to RN and Post -op Vital signs reviewed and stable ? ?Post vital signs: Reviewed and stable ? ?Last Vitals:  ?Vitals Value Taken Time  ?BP 90/53 09/28/21 1138  ?Temp    ?Pulse 66 09/28/21 1139  ?Resp 17 09/28/21 1139  ?SpO2 100 % 09/28/21 1139  ?Vitals shown include unvalidated device data. ? ?Last Pain:  ?Vitals:  ? 09/28/21 0610  ?TempSrc:   ?PainSc: 0-No pain  ?   ? ?  ? ?Complications: No notable events documented. ?

## 2021-09-28 NOTE — Hospital Course (Addendum)
History of present illness: ? ?Patient is a 71 year old female referred to Dr. [name] by Dr. 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  Dr. [name] evaluated the patient and her relevant studies and recommended proceeding with resection.  The patient denied chest pain.  She did note some intermittent dizziness and some occasional shortness of breath.  She was admitted electively for robotic assisted thoracoscopic surgery for resection. ? ?Hospital course: ? ?The patient was taken to the operating room and on 09/28/2021 underwent a left robotic assisted thoracoscopic surgical resection of her left lower lobe.  Additionally ,she had lymph node sampling.  She tolerated the procedure well, was extubated, and was taken to the postanesthesia care unit in stable condition. Chest tube was to water seal and there was an air leak. Chest xray showed small left apex and basilar pneumothorax.  This resolved on subsequent CXR.  Her air leak resolved and her serous output decreased.  Her chest tube was removed on 4/***/2023. She is ambulating with good oxygenation on room air. She has been tolerating a diet. She has diabetes and glucose has been well controlled with Insulin. All wounds are clean, dry, and healing without signs of infection. ?

## 2021-09-29 ENCOUNTER — Inpatient Hospital Stay (HOSPITAL_COMMUNITY): Payer: Medicare HMO

## 2021-09-29 ENCOUNTER — Encounter (HOSPITAL_COMMUNITY): Payer: Self-pay | Admitting: Thoracic Surgery (Cardiothoracic Vascular Surgery)

## 2021-09-29 LAB — CBC
HCT: 33.9 % — ABNORMAL LOW (ref 36.0–46.0)
Hemoglobin: 11.2 g/dL — ABNORMAL LOW (ref 12.0–15.0)
MCH: 31.2 pg (ref 26.0–34.0)
MCHC: 33 g/dL (ref 30.0–36.0)
MCV: 94.4 fL (ref 80.0–100.0)
Platelets: 138 10*3/uL — ABNORMAL LOW (ref 150–400)
RBC: 3.59 MIL/uL — ABNORMAL LOW (ref 3.87–5.11)
RDW: 13.1 % (ref 11.5–15.5)
WBC: 7.7 10*3/uL (ref 4.0–10.5)
nRBC: 0 % (ref 0.0–0.2)

## 2021-09-29 LAB — BASIC METABOLIC PANEL
Anion gap: 6 (ref 5–15)
BUN: 25 mg/dL — ABNORMAL HIGH (ref 8–23)
CO2: 27 mmol/L (ref 22–32)
Calcium: 8.4 mg/dL — ABNORMAL LOW (ref 8.9–10.3)
Chloride: 103 mmol/L (ref 98–111)
Creatinine, Ser: 1.3 mg/dL — ABNORMAL HIGH (ref 0.44–1.00)
GFR, Estimated: 44 mL/min — ABNORMAL LOW (ref >60)
Glucose, Bld: 146 mg/dL — ABNORMAL HIGH (ref 70–99)
Potassium: 4.5 mmol/L (ref 3.5–5.1)
Sodium: 136 mmol/L (ref 135–145)

## 2021-09-29 LAB — GLUCOSE, CAPILLARY
Glucose-Capillary: 109 mg/dL — ABNORMAL HIGH (ref 70–99)
Glucose-Capillary: 98 mg/dL (ref 70–99)
Glucose-Capillary: 144 mg/dL — ABNORMAL HIGH (ref 70–99)
Glucose-Capillary: 116 mg/dL — ABNORMAL HIGH (ref 70–99)

## 2021-09-29 MED ORDER — SODIUM CHLORIDE 0.9 % IV SOLN
INTRAVENOUS | Status: DC
Start: 2021-09-29 — End: 2021-10-04

## 2021-09-29 MED ORDER — SODIUM CHLORIDE 0.9 % IV BOLUS
500.0000 mL | Freq: Once | INTRAVENOUS | Status: AC
  Administered 2021-09-29: 500 mL via INTRAVENOUS

## 2021-09-29 MED ORDER — SODIUM CHLORIDE 0.9 % IV BOLUS: 500.0000 mL | Freq: Once | INTRAVENOUS | Status: DC

## 2021-09-29 MED ORDER — ALBUTEROL SULFATE (2.5 MG/3ML) 0.083% IN NEBU
2.5000 mg | INHALATION_SOLUTION | Freq: Three times a day (TID) | RESPIRATORY_TRACT | Status: DC
  Administered 2021-09-29 – 2021-09-30 (×3): 2.5 mg via RESPIRATORY_TRACT
  Filled 2021-09-29 (×3): qty 3

## 2021-09-29 MED ORDER — INSULIN DETEMIR 100 UNIT/ML ~~LOC~~ SOLN
10.0000 [IU] | Freq: Two times a day (BID) | SUBCUTANEOUS | Status: DC
  Administered 2021-09-29 – 2021-10-04 (×11): 10 [IU] via SUBCUTANEOUS
  Filled 2021-09-29 (×13): qty 0.1

## 2021-09-29 NOTE — Anesthesia Postprocedure Evaluation (Signed)
Anesthesia Post Note ? ?Patient: Alexandra Berger ? ?Procedure(s) Performed: XI ROBOTIC ASSISTED THORASCOPY-LEFT LOWER LOBE WEDGE, LEFT LOWER LOBE LOBECTOMY (Left: Chest) ?INTERCOSTAL NERVE BLOCK (Left: Chest) ?LYMPH NODE DISSECTION (Left: Chest) ? ?  ? ?Patient location during evaluation: PACU ?Anesthesia Type: General ?Level of consciousness: awake and alert ?Pain management: pain level controlled ?Vital Signs Assessment: post-procedure vital signs reviewed and stable ?Respiratory status: spontaneous breathing, nonlabored ventilation, respiratory function stable and patient connected to nasal cannula oxygen ?Cardiovascular status: blood pressure returned to baseline and stable ?Postop Assessment: no apparent nausea or vomiting ?Anesthetic complications: no ? ? ?No notable events documented. ? ?Last Vitals:  ?Vitals:  ? 09/29/21 1524 09/29/21 1555  ?BP:  (!) 103/50  ?Pulse:  90  ?Resp:  15  ?Temp:  36.6 ?C  ?SpO2: 98% 97%  ?  ?Last Pain:  ?Vitals:  ? 09/29/21 1555  ?TempSrc: Oral  ?PainSc:   ? ? ?  ?  ?  ?  ?  ?  ? ?Alexandra Berger ? ? ? ? ?

## 2021-09-29 NOTE — Progress Notes (Addendum)
? ?   ?  [address] ?      [name] [address] ?            [phone]   ? ?  ? ?1 Day Post-Op Procedure(s) (LRB): ?XI ROBOTIC ASSISTED THORASCOPY-LEFT LOWER LOBE WEDGE, LEFT LOWER LOBE LOBECTOMY (Left) ?INTERCOSTAL NERVE BLOCK (Left) ?LYMPH NODE DISSECTION (Left) ? ?Subjective: ?Patient eating breakfast this am. She has some incisional, left shoulder pain, but not "too bad". She is able to expectorate some sputum ? ?Objective: ?Vital signs in last 24 hours: ?Temp:  [97.7 ?F (36.5 ?C)-98.7 ?F (37.1 ?C)] 97.8 ?F (36.6 ?C) (04/07 0340) ?Pulse Rate:  [65-83] 67 (04/07 0340) ?Cardiac Rhythm: Normal sinus rhythm (04/06 2000) ?Resp:  [9-19] 17 (04/07 0340) ?BP: (85-134)/(49-123) 92/70 (04/07 0340) ?SpO2:  [97 %-100 %] 99 % (04/07 0340) ?Arterial Line BP: (89-97)/(49-61) 91/51 (04/06 1235) ? ?  ? ?Intake/Output from previous day: ?04/06 0701 - 04/07 0700 ?In: 1759.6 [P.O.:250; I.V.:1300; IV Piggyback:209.6] ?Out: 330 [Urine:60; Blood:100; Chest Tube:170] ? ? ?Physical Exam: ? ?Cardiovascular: RRR ?Pulmonary: Clear to auscultation on the right and coarse on the left (chest tube in place). ?Abdomen: Soft, non tender, bowel sounds present. ?Extremities: SCDs in place ?Wounds: Clean and dry.  No erythema or signs of infection. ?Chest Tube: to water seal, +++ air leak with cough ? ?Lab Results: ?CBC: ?Recent Labs  ?  09/27/21 ?1432 09/29/21 ?0054  ?WBC 4.3 7.7  ?HGB 14.1 11.2*  ?HCT 43.3 33.9*  ?PLT 195 138*  ? ?BMET:  ?Recent Labs  ?  09/27/21 ?1432 09/29/21 ?0054  ?NA 140 136  ?K 4.2 4.5  ?CL 107 103  ?CO2 26 27  ?GLUCOSE 105* 146*  ?BUN 16 25*  ?CREATININE 1.06* 1.30*  ?CALCIUM 9.1 8.4*  ?  ?PT/INR:  ?Recent Labs  ?  09/27/21 ?1432  ?LABPROT 12.5  ?INR 0.9  ? ?ABG:  ?INR: ?Will add last result for INR, ABG once components are confirmed ?Will add last 4 CBG results once components are confirmed ? ?Assessment/Plan: ? ?1. CV - SR with HR in the 70's this am. On Benazepril but with labile BP and slightly  elevated creatinine, will stop for now. ?2.  Pulmonary - On 1 L via [place]. Chest tube with 170 cc since surgery. Chest tube to water seal, +++ air leak with cough. CXR this am shows small left apex/basilar pneumothorax. Chest tube to remain for now. Encourage incentive spirometer. 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 ?3. History of DM-CBGs 121/143/109. Pre op HGA1C 6. On Insulin. ?4. Expected post op blood loss anemia-H and H this am decreased to 11.2 and 33.9 ?5. Mild thrombocytopenia-platelets this am 138,000 ?6. Creatinine slightly increased to 1.3 On scheduled Toradol, will continue and recheck in am ?7. On Lovenox for DVT prophylaxis ? ?Sharalyn Ink ZimmermanPA-C ?09/29/2021,7:05 AM ?[phone]  ? ?Agree with above. ?Airleak with cough. ?Continue pulmonary hygiene. ? ?Lajuana Matte ? ?

## 2021-09-29 NOTE — Progress Notes (Signed)
?   09/29/21 2200  ?Assess: MEWS Score  ?BP (!) 67/44  ?Pulse Rate 83  ?ECG Heart Rate 85  ?Resp 11  ?Level of Consciousness Alert  ?Assess: MEWS Score  ?MEWS Temp 0  ?MEWS Systolic 3  ?MEWS Pulse 0  ?MEWS RR 1  ?MEWS LOC 0  ?MEWS Score 4  ?MEWS Score Color Red  ?Assess: if the MEWS score is Yellow or Red  ?Were vital signs taken at a resting state? Yes  ?Focused Assessment No change from prior assessment  ?Early Detection of Sepsis Score *See Row Information* Low  ?MEWS guidelines implemented *See Row Information* Yes  ?Treat  ?MEWS Interventions Escalated (See documentation below) ?(Paged MD)  ?Pain Scale 0-10  ?Pain Score Asleep  ?Take Vital Signs  ?Increase Vital Sign Frequency  Red: Q 1hr X 4 then Q 4hr X 4, if remains red, continue Q 4hrs  ?Escalate  ?MEWS: Escalate Red: discuss with charge nurse/RN and provider, consider discussing with RRT  ?Notify: Charge Nurse/RN  ?Name of Charge Nurse/RN Notified Tanya, RN  ?Date Charge Nurse/RN Notified 09/29/21  ?Time Charge Nurse/RN Notified 2200  ?Notify: Provider  ?Provider Name/Title Dr. Cyndia Bent  ?Date Provider Notified 09/29/21  ?Time Provider Notified 2250  ?Notification Type Page  ?Notification Reason Change in status  ?Provider response Other (Comment) ?(Give pt another 500 bolus)  ?Date of Provider Response 09/29/21  ?Time of Provider Response 2255  ?Document  ?Progress note created (see row info) Yes  ? ? ?

## 2021-09-29 NOTE — Progress Notes (Signed)
Paged On Call Dr.Bartle regarding patients blood pressure, readings 63/39, 63/37, 67/44. Per Dr. Cyndia Bent "give patient fluids and maybe albumin" RN put in "verbal order" for IV fluid bolus 500 then 125 continuous. ? ?

## 2021-09-29 NOTE — Plan of Care (Signed)
?  Problem: Education: ?Goal: Knowledge of General Education information will improve ?Description: Including pain rating scale, medication(s)/side effects and non-pharmacologic comfort measures ?Outcome: Progressing ?  ?Problem: Health Behavior/Discharge Planning: ?Goal: Ability to manage health-related needs will improve ?Outcome: Progressing ?  ?Problem: Clinical Measurements: ?Goal: Respiratory complications will improve ?Outcome: Progressing ?  ?Problem: Pain Managment: ?Goal: General experience of comfort will improve ?Outcome: Progressing ?  ?Problem: Skin Integrity: ?Goal: Risk for impaired skin integrity will decrease ?Outcome: Progressing ?  ?

## 2021-09-30 ENCOUNTER — Inpatient Hospital Stay (HOSPITAL_COMMUNITY): Payer: Medicare HMO

## 2021-09-30 LAB — CBC
HCT: 31.1 % — ABNORMAL LOW (ref 36.0–46.0)
Hemoglobin: 10.2 g/dL — ABNORMAL LOW (ref 12.0–15.0)
MCH: 31.1 pg (ref 26.0–34.0)
MCHC: 32.8 g/dL (ref 30.0–36.0)
MCV: 94.8 fL (ref 80.0–100.0)
Platelets: 107 10*3/uL — ABNORMAL LOW (ref 150–400)
RBC: 3.28 MIL/uL — ABNORMAL LOW (ref 3.87–5.11)
RDW: 13.2 % (ref 11.5–15.5)
WBC: 3.7 10*3/uL — ABNORMAL LOW (ref 4.0–10.5)
nRBC: 0 % (ref 0.0–0.2)

## 2021-09-30 LAB — COMPREHENSIVE METABOLIC PANEL
ALT: 7 U/L (ref 0–44)
AST: 21 U/L (ref 15–41)
Albumin: 2.8 g/dL — ABNORMAL LOW (ref 3.5–5.0)
Alkaline Phosphatase: 41 U/L (ref 38–126)
Anion gap: 8 (ref 5–15)
BUN: 34 mg/dL — ABNORMAL HIGH (ref 8–23)
CO2: 22 mmol/L (ref 22–32)
Calcium: 7.8 mg/dL — ABNORMAL LOW (ref 8.9–10.3)
Chloride: 101 mmol/L (ref 98–111)
Creatinine, Ser: 1.52 mg/dL — ABNORMAL HIGH (ref 0.44–1.00)
GFR, Estimated: 37 mL/min — ABNORMAL LOW (ref >60)
Glucose, Bld: 128 mg/dL — ABNORMAL HIGH (ref 70–99)
Potassium: 3.5 mmol/L (ref 3.5–5.1)
Sodium: 131 mmol/L — ABNORMAL LOW (ref 135–145)
Total Bilirubin: 0.7 mg/dL (ref 0.3–1.2)
Total Protein: 4.7 g/dL — ABNORMAL LOW (ref 6.5–8.1)

## 2021-09-30 LAB — GLUCOSE, CAPILLARY
Glucose-Capillary: 111 mg/dL — ABNORMAL HIGH (ref 70–99)
Glucose-Capillary: 129 mg/dL — ABNORMAL HIGH (ref 70–99)
Glucose-Capillary: 159 mg/dL — ABNORMAL HIGH (ref 70–99)
Glucose-Capillary: 76 mg/dL (ref 70–99)
Glucose-Capillary: 98 mg/dL (ref 70–99)

## 2021-09-30 MED ORDER — ALBUTEROL SULFATE (2.5 MG/3ML) 0.083% IN NEBU: 2.5000 mg | INHALATION_SOLUTION | RESPIRATORY_TRACT | Status: DC | PRN

## 2021-09-30 MED ORDER — ALBUTEROL SULFATE (2.5 MG/3ML) 0.083% IN NEBU
2.5000 mg | INHALATION_SOLUTION | Freq: Two times a day (BID) | RESPIRATORY_TRACT | Status: DC
  Administered 2021-09-30 – 2021-10-04 (×8): 2.5 mg via RESPIRATORY_TRACT
  Filled 2021-09-30 (×8): qty 3

## 2021-09-30 NOTE — Progress Notes (Addendum)
? ?   ?  [address] ?      [name] [address] ?            [phone]   ? ?  ?2 Days Post-Op Procedure(s) (LRB): ?XI ROBOTIC ASSISTED THORASCOPY-LEFT LOWER LOBE WEDGE, LEFT LOWER LOBE LOBECTOMY (Left) ?INTERCOSTAL NERVE BLOCK (Left) ?LYMPH NODE DISSECTION (Left) ? ?Subjective: ? ?Patient without complaints.  Was hoping she would be discharged home today.  She is ambulating.  + BM ? ?Objective: ?Vital signs in last 24 hours: ?Temp:  [97.5 ?F (36.4 ?C)-97.9 ?F (36.6 ?C)] 97.8 ?F (36.6 ?C) (04/08 1683) ?Pulse Rate:  [72-97] 97 (04/08 0838) ?Cardiac Rhythm: Normal sinus rhythm (04/08 0701) ?Resp:  [10-19] 12 (04/08 7290) ?BP: (63-103)/(37-65) 85/59 (04/08 2111) ?SpO2:  [90 %-100 %] 100 % (04/08 0728) ? ?Intake/Output from previous day: ?04/07 0701 - 04/08 0700 ?In: 1887.1 [P.O.:720; I.V.:1167.1] ?Out: 340 [Chest Tube:340] ?Intake/Output this shift: ?Total I/O ?In: -  ?Out: 30 [Chest Tube:30] ? ?General appearance: alert, cooperative, and no distress ?Heart: regular rate and rhythm ?Lungs: clear to auscultation bilaterally ?Abdomen: soft, non-tender; bowel sounds normal; no masses,  no organomegaly ?Extremities: extremities normal, atraumatic, no cyanosis or edema ?Wound: clean and dry ? ?Lab Results: ?Recent Labs  ?  09/29/21 ?5520 09/30/21 ?8022  ?WBC 7.7 3.7*  ?HGB 11.2* 10.2*  ?HCT 33.9* 31.1*  ?PLT 138* 107*  ? ?BMET:  ?Recent Labs  ?  09/29/21 ?0054 09/30/21 ?3361  ?NA 136 131*  ?K 4.5 3.5  ?CL 103 101  ?CO2 27 22  ?GLUCOSE 146* 128*  ?BUN 25* 34*  ?CREATININE 1.30* 1.52*  ?CALCIUM 8.4* 7.8*  ?  ?PT/INR:  ?Recent Labs  ?  09/27/21 ?1432  ?LABPROT 12.5  ?INR 0.9  ? ?ABG ?   ?Component Value Date/Time  ? PHART 7.42 09/27/2021 1505  ? HCO3 32.4 (H) 09/27/2021 1505  ? TCO2 30 08/29/2021 0805  ? O2SAT 97.9 09/27/2021 1505  ? ?CBG (last 3)  ?Recent Labs  ?  09/29/21 ?2027 09/30/21 ?2244 09/30/21 ?9753  ?GLUCAP 116* 129* 76  ? ? ?Assessment/Plan: ?S/P Procedure(s) (LRB): ?XI ROBOTIC ASSISTED  THORASCOPY-LEFT LOWER LOBE WEDGE, LEFT LOWER LOBE LOBECTOMY (Left) ?INTERCOSTAL NERVE BLOCK (Left) ?LYMPH NODE DISSECTION (Left) ? ?CV- NSR, + Hypotension- receiving IV fluids, home lotensin on hold ?Pulm- CT on waters seal with air leak, CXR shows resolution of pneumothorax ?Renal- slight bump in creatinine to 1.50, likely a combination of Toradol usage and hypotension, currently receiving IV fluids, repeat BMET in AM ?Expected  post operative blood loss anemia- Hgb at 10 ?DM- sugars controlled ?Dispo- patient stable, CT with air leak, + serous output, on fluids for hypotension, repeat CXR, labs in AM ? ? LOS: 2 days  ? ? ?Ellwood Handler, PA-C ?09/30/2021 ? ? Chart reviewed, patient examined, agree with above. ?She had hypotension overnight and required multiple fluid boluses to bring pressure up. Suspect she was dehydrated. BP this am is 93/58. She feels ok. ?Still has small air leak from chest tube. CXR shows no ptx. Repeat CXR in am. ?

## 2021-09-30 NOTE — Plan of Care (Signed)
?  Problem: Education: ?Goal: Knowledge of General Education information will improve ?Description: Including pain rating scale, medication(s)/side effects and non-pharmacologic comfort measures ?Outcome: Progressing ?  ?Problem: Health Behavior/Discharge Planning: ?Goal: Ability to manage health-related needs will improve ?Outcome: Progressing ?  ?Problem: Clinical Measurements: ?Goal: Ability to maintain clinical measurements within normal limits will improve ?Outcome: Progressing ?Goal: Will remain free from infection ?Outcome: Progressing ?Goal: Diagnostic test results will improve ?Outcome: Progressing ?Goal: Respiratory complications will improve ?Outcome: Progressing ?Goal: Cardiovascular complication will be avoided ?Outcome: Progressing ?  ?Problem: Activity: ?Goal: Risk for activity intolerance will decrease ?Outcome: Progressing ?  ?Problem: Nutrition: ?Goal: Adequate nutrition will be maintained ?Outcome: Progressing ?  ?Problem: Coping: ?Goal: Level of anxiety will decrease ?Outcome: Progressing ?  ?Problem: Elimination: ?Goal: Will not experience complications related to bowel motility ?Outcome: Progressing ?Goal: Will not experience complications related to urinary retention ?Outcome: Progressing ?  ?Problem: Pain Managment: ?Goal: General experience of comfort will improve ?Outcome: Progressing ?  ?Problem: Safety: ?Goal: Ability to remain free from injury will improve ?Outcome: Progressing ?  ?Problem: Education: ?Goal: Knowledge of disease or condition will improve ?Outcome: Progressing ?Goal: Knowledge of the prescribed therapeutic regimen will improve ?Outcome: Progressing ?  ?Problem: Activity: ?Goal: Risk for activity intolerance will decrease ?Outcome: Progressing ?  ?Problem: Cardiac: ?Goal: Will achieve and/or maintain hemodynamic stability ?Outcome: Progressing ?  ?Problem: Clinical Measurements: ?Goal: Postoperative complications will be avoided or minimized ?Outcome: Progressing ?   ?Problem: Respiratory: ?Goal: Respiratory status will improve ?Outcome: Progressing ?  ?Problem: Pain Management: ?Goal: Pain level will decrease ?Outcome: Progressing ?  ?Problem: Skin Integrity: ?Goal: Wound healing without signs and symptoms infection will improve ?Outcome: Progressing ?  ?

## 2021-10-01 ENCOUNTER — Inpatient Hospital Stay (HOSPITAL_COMMUNITY): Payer: Medicare HMO

## 2021-10-01 LAB — BASIC METABOLIC PANEL
Anion gap: 5 (ref 5–15)
BUN: 28 mg/dL — ABNORMAL HIGH (ref 8–23)
CO2: 22 mmol/L (ref 22–32)
Calcium: 7.8 mg/dL — ABNORMAL LOW (ref 8.9–10.3)
Chloride: 104 mmol/L (ref 98–111)
Creatinine, Ser: 1.07 mg/dL — ABNORMAL HIGH (ref 0.44–1.00)
GFR, Estimated: 56 mL/min — ABNORMAL LOW (ref >60)
Glucose, Bld: 152 mg/dL — ABNORMAL HIGH (ref 70–99)
Potassium: 3.8 mmol/L (ref 3.5–5.1)
Sodium: 131 mmol/L — ABNORMAL LOW (ref 135–145)

## 2021-10-01 LAB — GLUCOSE, CAPILLARY
Glucose-Capillary: 86 mg/dL (ref 70–99)
Glucose-Capillary: 88 mg/dL (ref 70–99)
Glucose-Capillary: 154 mg/dL — ABNORMAL HIGH (ref 70–99)
Glucose-Capillary: 94 mg/dL (ref 70–99)

## 2021-10-01 MED ORDER — ALPRAZOLAM 0.5 MG PO TABS: 0.5000 mg | ORAL_TABLET | Freq: Three times a day (TID) | ORAL | Status: DC | PRN

## 2021-10-01 NOTE — Progress Notes (Addendum)
? ?   ?  [address] ?      [name] [address] ?            [phone]   ? ?  ?3 Days Post-Op Procedure(s) (LRB): ?XI ROBOTIC ASSISTED THORASCOPY-LEFT LOWER LOBE WEDGE, LEFT LOWER LOBE LOBECTOMY (Left) ?INTERCOSTAL NERVE BLOCK (Left) ?LYMPH NODE DISSECTION (Left) ? ?Subjective: ? ?No new complaints.  Wants to go home.  + ambulation   + BM ? ?Objective: ?Vital signs in last 24 hours: ?Temp:  [97.7 ?F (36.5 ?C)-98 ?F (36.7 ?C)] 97.9 ?F (36.6 ?C) (04/09 2330) ?Pulse Rate:  [72-87] 72 (04/09 0713) ?Cardiac Rhythm: Normal sinus rhythm (04/09 0700) ?Resp:  [12-19] 15 (04/09 0713) ?BP: (82-113)/(56-76) 88/70 (04/09 0762) ?SpO2:  [92 %-97 %] 97 % (04/09 0713) ? ?Intake/Output from previous day: ?04/08 0701 - 04/09 0700 ?In: 1377.5 [I.V.:1377.5] ?Out: 320 [Chest Tube:320] ?Intake/Output this shift: ?Total I/O ?In: -  ?Out: 77 [Chest Tube:60] ? ?General appearance: alert, cooperative, and no distress ?Heart: regular rate and rhythm ?Lungs: clear to auscultation bilaterally ?Abdomen: soft, non-tender; bowel sounds normal; no masses,  no organomegaly ?Extremities: extremities normal, atraumatic, no cyanosis or edema ?Wound: clean and dry ? ?Lab Results: ?Recent Labs  ?  09/29/21 ?2633 09/30/21 ?3545  ?WBC 7.7 3.7*  ?HGB 11.2* 10.2*  ?HCT 33.9* 31.1*  ?PLT 138* 107*  ? ?BMET:  ?Recent Labs  ?  09/30/21 ?6256 10/01/21 ?0103  ?NA 131* 131*  ?K 3.5 3.8  ?CL 101 104  ?CO2 22 22  ?GLUCOSE 128* 152*  ?BUN 34* 28*  ?CREATININE 1.52* 1.07*  ?CALCIUM 7.8* 7.8*  ?  ?PT/INR: No results for input(s): LABPROT, INR in the last 72 hours. ?ABG ?   ?Component Value Date/Time  ? PHART 7.42 09/27/2021 1505  ? HCO3 32.4 (H) 09/27/2021 1505  ? TCO2 30 08/29/2021 0805  ? O2SAT 97.9 09/27/2021 1505  ? ?CBG (last 3)  ?Recent Labs  ?  09/30/21 ?2354 10/01/21 ?0643 10/01/21 ?0725  ?GLUCAP 159* 88 86  ? ? ?Assessment/Plan: ?S/P Procedure(s) (LRB): ?XI ROBOTIC ASSISTED THORASCOPY-LEFT LOWER LOBE WEDGE, LEFT LOWER LOBE LOBECTOMY  (Left) ?INTERCOSTAL NERVE BLOCK (Left) ?LYMPH NODE DISSECTION (Left) ? ?CV- NSR, hypotension improved ?Pulm- CT in place with + air leak, improved from yesterday, serous output remains high (level currently at 900 in pleurovac).. CXR with stable left apical space.. will leave chest tube to water seal today ?Renal- creatinine improved down to 1.07, no further toradol ?DM- sugars are controlled ?Dispo- patient stable, CT with air leak, but improving, CXR with stable appearance of pneumothorax on left, serous output remains elevated.. will repeat CXR in AM, can hopefully d/c chest tube soon ? ? LOS: 3 days  ? ? ?Ellwood Handler, PA-C ?10/01/2021 ? ? Chart reviewed, patient examined, agree with above. ?Still has small air leak and small apical ptx. Continue chest tube to water seal ?

## 2021-10-02 ENCOUNTER — Inpatient Hospital Stay (HOSPITAL_COMMUNITY): Payer: Medicare HMO

## 2021-10-02 LAB — GLUCOSE, CAPILLARY
Glucose-Capillary: 100 mg/dL — ABNORMAL HIGH (ref 70–99)
Glucose-Capillary: 152 mg/dL — ABNORMAL HIGH (ref 70–99)
Glucose-Capillary: 96 mg/dL (ref 70–99)

## 2021-10-02 NOTE — Progress Notes (Addendum)
? ?   ?  [address] ?      [name] [address] ?            [phone]   ? ?  ? ?4 Days Post-Op Procedure(s) (LRB): ?XI ROBOTIC ASSISTED THORASCOPY-LEFT LOWER LOBE WEDGE, LEFT LOWER LOBE LOBECTOMY (Left) ?INTERCOSTAL NERVE BLOCK (Left) ?LYMPH NODE DISSECTION (Left) ? ?Subjective: ?Patient about to eat breakfast this am. She hopes chest tube gets removed soon. ? ?Objective: ?Vital signs in last 24 hours: ?Temp:  [98 ?F (36.7 ?C)-98.2 ?F (36.8 ?C)] 98.2 ?F (36.8 ?C) (04/10 0434) ?Pulse Rate:  [72-91] 76 (04/10 0434) ?Cardiac Rhythm: Sinus tachycardia (04/09 2114) ?Resp:  [10-16] 16 (04/10 0434) ?BP: (83-116)/(47-69) 97/47 (04/10 0434) ?SpO2:  [94 %-100 %] 100 % (04/10 0434) ? ?  ? ?Intake/Output from previous day: ?04/09 0701 - 04/10 0700 ?In: -  ?Out: [place] ? ? ?Physical Exam: ? ?Cardiovascular: RRR ?Pulmonary: Clear to auscultation on the right and coarse/rub on the left (chest tube in place). ?Abdomen: Soft, non tender, bowel sounds present. ?Extremities: SCDs in place ?Wounds: Clean and dry.  No erythema or signs of infection. Serous drainage from around chest tube ?Chest Tube: to water seal, +++ air leak with cough ? ?Lab Results: ?CBC: ?Recent Labs  ?  09/30/21 ?2334  ?WBC 3.7*  ?HGB 10.2*  ?HCT 31.1*  ?PLT 107*  ? ? ?BMET:  ?Recent Labs  ?  09/30/21 ?3568 10/01/21 ?0103  ?NA 131* 131*  ?K 3.5 3.8  ?CL 101 104  ?CO2 22 22  ?GLUCOSE 128* 152*  ?BUN 34* 28*  ?CREATININE 1.52* 1.07*  ?CALCIUM 7.8* 7.8*  ? ?  ?PT/INR:  ?No results for input(s): LABPROT, INR in the last 72 hours. ? ?ABG:  ?INR: ?Will add last result for INR, ABG once components are confirmed ?Will add last 4 CBG results once components are confirmed ? ?Assessment/Plan: ? ?1. CV - SR with HR in the 70's this am. On Benazepril but with labile BP and slightly elevated creatinine, will stop for now. ?2.  Pulmonary - On room air. Chest tube with 220 cc last 24 hours. Chest tube to water seal, +++ air leak with cough. CXR  this am shows small,stable left apex pneumothorax. Chest tube to remain for now. Encourage incentive spirometer. 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 ?3. History of DM-CBGs 121/143/109. Pre op HGA1C 6. On Insulin. ?4. Expected post op blood loss anemia-Last H and H 10.2 and 31.1. ?5. Mild thrombocytopenia-Last platelets 107,000 ?7. On Lovenox for DVT prophylaxis ? ?Sharalyn Ink ZimmermanPA-C ?10/02/2021,6:56 AM ?[phone]  ? ?Patient seen and examined, agree with above ?There is a relatively small air leak this afternoon with a lot of tidal motion. ? ?Revonda Standard. Roxan Hockey, MD ?[facility] ?[phone] ?

## 2021-10-02 NOTE — Plan of Care (Signed)
?  Problem: Education: ?Goal: Knowledge of General Education information will improve ?Description: Including pain rating scale, medication(s)/side effects and non-pharmacologic comfort measures ?Outcome: Progressing ?  ?Problem: Health Behavior/Discharge Planning: ?Goal: Ability to manage health-related needs will improve ?Outcome: Progressing ?  ?Problem: Clinical Measurements: ?Goal: Ability to maintain clinical measurements within normal limits will improve ?Outcome: Progressing ?Goal: Will remain free from infection ?Outcome: Progressing ?Goal: Diagnostic test results will improve ?Outcome: Progressing ?Goal: Respiratory complications will improve ?Outcome: Progressing ?Goal: Cardiovascular complication will be avoided ?Outcome: Progressing ?  ?Problem: Activity: ?Goal: Risk for activity intolerance will decrease ?Outcome: Progressing ?  ?Problem: Nutrition: ?Goal: Adequate nutrition will be maintained ?Outcome: Progressing ?  ?Problem: Coping: ?Goal: Level of anxiety will decrease ?Outcome: Progressing ?  ?Problem: Elimination: ?Goal: Will not experience complications related to bowel motility ?Outcome: Progressing ?Goal: Will not experience complications related to urinary retention ?Outcome: Progressing ?  ?Problem: Pain Managment: ?Goal: General experience of comfort will improve ?Outcome: Progressing ?  ?Problem: Safety: ?Goal: Ability to remain free from injury will improve ?Outcome: Progressing ?  ?Problem: Skin Integrity: ?Goal: Risk for impaired skin integrity will decrease ?Outcome: Progressing ?  ?Problem: Education: ?Goal: Knowledge of disease or condition will improve ?Outcome: Progressing ?Goal: Knowledge of the prescribed therapeutic regimen will improve ?Outcome: Progressing ?  ?Problem: Activity: ?Goal: Risk for activity intolerance will decrease ?Outcome: Progressing ?  ?Problem: Cardiac: ?Goal: Will achieve and/or maintain hemodynamic stability ?Outcome: Progressing ?  ?Problem: Clinical  Measurements: ?Goal: Postoperative complications will be avoided or minimized ?Outcome: Progressing ?  ?Problem: Respiratory: ?Goal: Respiratory status will improve ?Outcome: Progressing ?  ?Problem: Pain Management: ?Goal: Pain level will decrease ?Outcome: Progressing ?  ?Problem: Skin Integrity: ?Goal: Wound healing without signs and symptoms infection will improve ?Outcome: Progressing ?  ?

## 2021-10-02 NOTE — Care Management Important Message (Signed)
Important Message ? ?Patient Details  ?Name: Alexandra Berger ?MRN: [id number] ?Date of Birth: 08-Dec-1950 ? ? ?Medicare Important Message Given:  Yes ? ? ? ? ?Nichele Slawson ?10/02/2021, 3:41 PM ?

## 2021-10-03 ENCOUNTER — Inpatient Hospital Stay (HOSPITAL_COMMUNITY): Payer: Medicare HMO

## 2021-10-03 LAB — GLUCOSE, CAPILLARY
Glucose-Capillary: 110 mg/dL — ABNORMAL HIGH (ref 70–99)
Glucose-Capillary: 113 mg/dL — ABNORMAL HIGH (ref 70–99)
Glucose-Capillary: 118 mg/dL — ABNORMAL HIGH (ref 70–99)
Glucose-Capillary: 92 mg/dL (ref 70–99)

## 2021-10-03 NOTE — Progress Notes (Addendum)
? ?   ?  [address] ?      [name] [address] ?            [phone]   ? ?  ? ?5 Days Post-Op Procedure(s) (LRB): ?XI ROBOTIC ASSISTED THORASCOPY-LEFT LOWER LOBE WEDGE, LEFT LOWER LOBE LOBECTOMY (Left) ?INTERCOSTAL NERVE BLOCK (Left) ?LYMPH NODE DISSECTION (Left) ? ?Subjective: ?Feels well, no new concerns.  ? ?Objective: ?Vital signs in last 24 hours: ?Temp:  [98.5 ?F (36.9 ?C)-98.7 ?F (37.1 ?C)] 98.5 ?F (36.9 ?C) (04/11 2197) ?Pulse Rate:  [80-87] 87 (04/11 0747) ?Cardiac Rhythm: Normal sinus rhythm (04/11 0756) ?Resp:  [15-20] 15 (04/11 0758) ?BP: (106-119)/(64-82) 119/68 (04/11 0758) ?SpO2:  [92 %-100 %] 96 % (04/11 0758) ? ?  ? ?Intake/Output from previous day: ?04/10 0701 - 04/11 0700 ?In: 240 [P.O.:240] ?Out: 350 [Chest Tube:350] ? ? ?Physical Exam: ? ?Cardiovascular: RRR ?Pulmonary: Clear to auscultation bilaterally ?Abdomen: Soft, non tender, bowel sounds present. ?Extremities: SCDs in place ?Wounds: Clean and dry.  No erythema or signs of infection. Serous drainage from around chest tube ?Chest Tube: to water seal, brisk tidling, no obvious air leak.  ? ?Lab Results: ?CBC: ?No results for input(s): WBC, HGB, HCT, PLT in the last 72 hours. ? ?BMET:  ?Recent Labs  ?  10/01/21 ?0103  ?NA 131*  ?K 3.8  ?CL 104  ?CO2 22  ?GLUCOSE 152*  ?BUN 28*  ?CREATININE 1.07*  ?CALCIUM 7.8*  ? ?  ?PT/INR:  ?No results for input(s): LABPROT, INR in the last 72 hours. ? ?ABG:  ?INR: ?Will add last result for INR, ABG once components are confirmed ?Will add last 4 CBG results once components are confirmed ? ?Assessment/Plan: ? ? -CV - SR with HR in the 80's this am. Stable BP ? ?-Pulmonary - On room air. Chest tube to water seal, brisk tidal motion but no air leak.  CXR this am shows small,stable left apex pneumothorax. Chest tube clamped, will repeat the CXR in 2 hours.   Path pending.  ? ?- History of DM-CBGs  controlled.  Pre op HGA1C 6. On Insulin. ? ?-Expected post op blood loss anemia-Last H and H  10.2 and 31.1. ? ? -On Lovenox for DVT prophylaxis ? ?Arlis Porta RoddenberryPA-C ?10/03/2021,8:50 AM ?[phone]  ? ?Patient seen and examined, agree with above ?Will do a clamping trial this AM ? ?Revonda Standard Roxan Hockey, MD ?[facility] ?[phone] ? ? ?

## 2021-10-03 NOTE — Progress Notes (Signed)
Chest tube site dressing saturated with pale yellow drainage. Dressing changed. Continue  to monitor. ?

## 2021-10-03 NOTE — Progress Notes (Signed)
Tolerated clamping of the chest tube, chest- x-ray done. Chest tube d/ced as ordered, tolerated well. 2x2  and tegaderm applied. Continue to monitor. ?

## 2021-10-04 ENCOUNTER — Inpatient Hospital Stay (HOSPITAL_COMMUNITY): Payer: Medicare HMO

## 2021-10-04 DIAGNOSIS — J90 Pleural effusion, not elsewhere classified: Secondary | ICD-10-CM | POA: Diagnosis not present

## 2021-10-04 DIAGNOSIS — J939 Pneumothorax, unspecified: Secondary | ICD-10-CM | POA: Diagnosis not present

## 2021-10-04 LAB — GLUCOSE, CAPILLARY: Glucose-Capillary: 98 mg/dL (ref 70–99)

## 2021-10-04 LAB — TYPE AND SCREEN
ABO/RH(D): A POS
Antibody Screen: NEGATIVE
Unit division: 0
Unit division: 0

## 2021-10-04 LAB — BPAM RBC
Blood Product Expiration Date: 202305032359
Blood Product Expiration Date: 202305062359
Unit Type and Rh: 6200
Unit Type and Rh: 6200

## 2021-10-04 MED ORDER — TRAMADOL HCL 50 MG PO TABS: 50.0000 mg | ORAL_TABLET | Freq: Four times a day (QID) | ORAL | 0 refills | Status: AC | PRN

## 2021-10-04 NOTE — Progress Notes (Signed)
? ?   ?  [address] ?      [name] [address] ?            [phone]   ? ?  ? ?6 Days Post-Op Procedure(s) (LRB): ?XI ROBOTIC ASSISTED THORASCOPY-LEFT LOWER LOBE WEDGE, LEFT LOWER LOBE LOBECTOMY (Left) ?INTERCOSTAL NERVE BLOCK (Left) ?LYMPH NODE DISSECTION (Left) ? ?Subjective: ?Feels well, no new concerns.  ? ?Objective: ?Vital signs in last 24 hours: ?Temp:  [98 ?F (36.7 ?C)-99 ?F (37.2 ?C)] 98.3 ?F (36.8 ?C) (04/12 1245) ?Pulse Rate:  [75-90] 75 (04/12 0345) ?Cardiac Rhythm: Normal sinus rhythm (04/12 0717) ?Resp:  [13-20] 13 (04/12 0345) ?BP: (100-131)/(59-73) 113/61 (04/12 0345) ?SpO2:  [90 %-94 %] 90 % (04/12 0345) ? ?  ?Intake/Output from previous day: ?04/11 0701 - 04/12 0700 ?In: 2012.5 [P.O.:680; I.V.:1332.5] ?Out: 100 [Chest Tube:100] ? ? ?Physical Exam: ? ?Cardiovascular: RRR ?Pulmonary: Clear to auscultation bilaterally ?Wounds: Port sites are clean and dry.  No erythema or signs of infection. Serous drainage from the ? chest tube ? ? ? ?Diagnostics: ?CLINICAL DATA:  Pneumothorax. ?  ?EXAM: ?CHEST - 2 VIEW ?  ?COMPARISON:  10/03/2021 ?  ?FINDINGS: ?Similar small left apical pneumothorax. The left chest tube seen ?previously has been removed in the interval. Small left pleural ?effusion evident. There is a tiny effusion on the right as well. ?Cardiopericardial silhouette is at upper limits of normal for size. ?Interstitial and airspace disease at the left base is similar. ?  ?IMPRESSION: ?1. Similar small left apical pneumothorax status post chest tube ?removal. ?2. Persistent interstitial and airspace disease at the left base ?with small bilateral pleural effusions. ?  ?  ?Electronically Signed ?  By: Misty Stanley M.D. ?  On: 10/04/2021 07:47 ? ?Assessment/Plan: ? ? -CV -  Stable BP and cardiac rhythm ? ?-Pulmonary - On room air. Successful clamp trial yesterday so the chest tube was removed. The CXR is stable. Having some serous drainage from the chest tube site.  ? ?- History of  DM-CBGs  controlled.  Pre op HGA1C 6. On Levemir at home. ? ?-Expected post op blood loss anemia-Last H and H 10.2 and 31.1. ? ? -Disposition- plan for discharge to home today. To continue      dressing changes to the CT site as needed until the site is dry. Office follow up scheduled for next Friday, 4/21. ? ?Joline Maxcy ?10/04/2021,8:30 AM ?[phone]  ? ? ? ? ?

## 2021-10-10 ENCOUNTER — Encounter: Payer: Self-pay | Admitting: *Deleted

## 2021-10-10 NOTE — Progress Notes (Signed)
Oncology Nurse Navigator Documentation ? ? ?  10/10/2021  ? 10:00 AM 09/08/2021  ? 12:00 PM  ?Oncology Nurse Navigator Flowsheets  ?Navigator Follow Up Date: 10/31/2021 09/25/2021  ?Navigator Follow Up Reason: New Patient Appointment New Patient Appointment  ?[facility]igator [facility]  ?Referral Date to RadOnc/MedOnc  09/08/2021  ?Navigator Encounter Type Appt/Treatment Plan Review Other:  ?Patient Visit Type Other   ?Treatment Phase Other   ?Barriers/Navigation Needs Coordination of Care/I followed up on Alexandra Berger discharge from the hospital.  She was unable to make Dr. Worthy Flank appt due to recent hospitalization.  She is currently discharged. I notified new patient scheduler to call and schedule her to be seen on 5/9 with labs and Dr. Julien Nordmann.  Coordination of Care  ?Interventions Coordination of Care Coordination of Care  ?Acuity Level 2-Minimal Needs (1-2 Barriers Identified) Level 2-Minimal Needs (1-2 Barriers Identified)  ?Coordination of Care Other Other  ?Time Spent with Patient 30 30  ?  ?

## 2021-10-11 ENCOUNTER — Telehealth: Payer: Self-pay | Admitting: Internal Medicine

## 2021-10-11 NOTE — Telephone Encounter (Signed)
Scheduled appt per 4/18 staff msg from [employer]. Called pt,no answer. Left msg with appt date and time. Requested for pt to call back to confirm appt. Left my direct number to call back.  ?

## 2021-10-12 ENCOUNTER — Encounter: Payer: Self-pay | Admitting: *Deleted

## 2021-10-12 ENCOUNTER — Telehealth: Payer: Self-pay | Admitting: Internal Medicine

## 2021-10-12 ENCOUNTER — Other Ambulatory Visit: Payer: Self-pay | Admitting: *Deleted

## 2021-10-12 LAB — SURGICAL PATHOLOGY

## 2021-10-12 NOTE — Progress Notes (Signed)
The proposed treatment discussed in conference is for discussion purpose only and is not a binding recommendation. The patient was not physically examined, or presented with their treatment options. Therefore, final treatment plans cannot be decided.  ?

## 2021-10-12 NOTE — Progress Notes (Signed)
Oncology Nurse Navigator Documentation ? ? ?  10/12/2021  ? 10:00 AM 10/10/2021  ? 10:00 AM 09/08/2021  ? 12:00 PM  ?Oncology Nurse Navigator Flowsheets  ?Navigator Follow Up Date:  10/31/2021 09/25/2021  ?Navigator Follow Up Reason:  New Patient Appointment New Patient Appointment  ?[facility]igator Location [facility]  ?Referral Date to RadOnc/MedOnc   09/08/2021  ?Navigator Encounter Type Other: Appt/Treatment Plan Review Other:  ?Patient Visit Type  Other   ?Treatment Phase  Other   ?Barriers/Navigation Needs Coordination of Care/patient case discussed at thoracic cancer conference. See TB documentation flow sheet for updates.  Coordination of Care Coordination of Care  ?Interventions Coordination of Care Coordination of Care Coordination of Care  ?Acuity Level 2-Minimal Needs (1-2 Barriers Identified) Level 2-Minimal Needs (1-2 Barriers Identified) Level 2-Minimal Needs (1-2 Barriers Identified)  ?Coordination of Care Other Other Other  ?Time Spent with Patient 30 30 30   ?  ?

## 2021-10-12 NOTE — Telephone Encounter (Signed)
Scheduled appt per 4/18 staff msg from [employer]. Called pt,no answer. Left msg with appt date and time. Requested for pt to call back to confirm appt. Left my direct number to call back.  ?

## 2021-10-13 ENCOUNTER — Ambulatory Visit (INDEPENDENT_AMBULATORY_CARE_PROVIDER_SITE_OTHER): Payer: Self-pay | Admitting: Thoracic Surgery (Cardiothoracic Vascular Surgery)

## 2021-10-13 ENCOUNTER — Encounter: Payer: Self-pay | Admitting: *Deleted

## 2021-10-13 VITALS — BP 90/67 | HR 70 | Resp 20 | Ht 66.0 in | Wt 193.0 lb

## 2021-10-13 DIAGNOSIS — Z902 Acquired absence of lung [part of]: Secondary | ICD-10-CM

## 2021-10-13 NOTE — Progress Notes (Signed)
Oncology Nurse Navigator Documentation ? ? ?  10/13/2021  ?  9:00 AM 10/12/2021  ? 10:00 AM 10/10/2021  ? 10:00 AM 09/08/2021  ? 12:00 PM  ?Oncology Nurse Navigator Flowsheets  ?Navigator Follow Up Date:   10/31/2021 09/25/2021  ?Navigator Follow Up Reason:   New Patient Appointment New Patient Appointment  ?[facility]igator Location [facility]  ?Referral Date to RadOnc/MedOnc    09/08/2021  ?Navigator Encounter Type Appt/Treatment Plan Review Other: Appt/Treatment Plan Review Other:  ?Patient Visit Type   Other   ?Treatment Phase   Other   ?Barriers/Navigation Needs Coordination of Care Coordination of Care Coordination of Care Coordination of Care  ?Interventions Coordination of Care/I followed up and noticed Ms. Swanson-Bagley has been scheduled to be seen with Dr. Julien Nordmann on 5/9.  Coordination of Care Coordination of Care Coordination of Care  ?Acuity Level 2-Minimal Needs (1-2 Barriers Identified) Level 2-Minimal Needs (1-2 Barriers Identified) Level 2-Minimal Needs (1-2 Barriers Identified) Level 2-Minimal Needs (1-2 Barriers Identified)  ?Coordination of Care Appts Other Other Other  ?Time Spent with Patient 30 30 30 30   ?  ?

## 2021-10-13 NOTE — Progress Notes (Signed)
? ?   ?[address] ?      [name] [address] ?            [phone]       ? ?[name] ?[place] Record #[id number] ?Date of Birth: 04/04/1951 ? ?Referring: Alexandra Nash, DO ?Primary Care: Alexandra Congress, NP ?Primary Cardiologist:None ? ?Reason for visit:   follow-up ? ?History of Present Illness:     ?71 year old patient comes in for 1 week follow-up appointment.  Overall she is doing well.  She has no complaints.  She denies any shortness of breath. ? ?Physical Exam: ?BP 90/67 (BP Location: Left Arm, Patient Position: Sitting)   Pulse 70   Resp 20   Ht 5\' 6"  (1.676 m)   Wt 193 lb (87.5 kg)   SpO2 91% Comment: RA  BMI 31.15 kg/m?  ? ?Alert NAD ?Incision clean.  ?Abdomen, ND ?No peripheral edema ? ? ?Diagnostic Studies & Laboratory data: ? ?Path: FINAL MICROSCOPIC DIAGNOSIS:  ? ?A.   LUNG, LEFT LOWER LOBE, WEDGE RESECTION:  ?-    Typical carcinoid tumor/neuroendocrine tumor, grade 1.  ? ?B.   LYMPH NODE, LEVEL 9, EXCISION:  ?-    Lymph node, negative for malignancy (0/1).  ? ?C.   LYMPH NODE, LEVEL 7, EXCISION:  ?-    Lymph node, negative for malignancy (0/1).  ? ?D.   LYMPH NODE, LEVEL 5, EXCISION:  ?-    Lymph node, negative for malignancy (0/1).  ? ?E.   LUNG, LEFT LOWER LOBE #2, WEDGE RESECTION:  ?-    Lung tissue with no tumor identified.  ? ?F.   LYMPH NODE, HILAR, EXCISION:  ?-    Lymph node, negative for malignancy.  ? ?G.   LYMPH NODE, HILAR #2, EXCISION:  ?-    Lymph node, negative for malignancy.  ? ?H.   LUNG, LEFT LOWER LOBE, LOBECTOMY:  ?-    Typical carcinoid tumor/neuroendocrine tumor, grade 1, 21 cm in  ?greatest dimension.  ?-    All margins negative for tumor.  ?-    Twenty-three lymph nodes, presumed lobar (12L), all nodes negative  ?for malignancy (0/23).  ? ?I.   LUNG, LEFT LOWER LOBE #3, WEDGE RESECTION:  ?-    Lung tissue with no tumor identified.  ? ?ONCOLOGY TABLE:  ? ? ?LUNG: Resection  ? ?Synchronous Tumors: Not applicable  ?Total  Number of Primary Tumors: 1  ?Procedure: Wedge resection and completion lobectomy  ?Specimen Laterality: Left  ?Tumor Focality: Unifocal  ?Tumor Site: Lower lobe of lung  ?Tumor Size:  ?     Total Tumor Size: At least 21 cm in greatest dimension.  ?Histologic Type: Typical carcinoid/neuroendocrine tumor, grade 1  ?Visceral Pleura Invasion: Not identified  ?Direct Invasion of Adjacent Structures: No adjacent structures present]  ?Lymphovascular Invasion: Not identified  ?Margins: All margins negative for carcinoid  ?     Closest Margin(s) to Carcinoid:    0.5 cm, parenchymal margin  ?     Margin(s) Involved by Invasive Carcinoma: Not applicable  ? ?     Margin Status for Non-Invasive Tumor: Not applicable  ?Treatment Effect: No known presurgical therapy  ?Regional Lymph Nodes:  ?     Number of Lymph Nodes Involved: 0  ?                          Nodal Sites with Tumor: Not applicable  ?  Number of Lymph Nodes Examined: 26  ?                     Nodal Sites Examined: 5, 7, 9, 12  ?Distant Metastasis:  ?     Distant Site(s) Involved: Not applicable  ?Pathologic Stage Classification (pTNM, AJCC 8th Edition): pT1c, pN0 ?  ? ?Assessment / Plan:   ?71 year old female status post robotic assisted thoracoscopy for left lower lobectomy.  She she has a T1 cN0 M0 carcinoid tumor.  Overall she is doing well.  She will follow-up in 1 month with a chest x-ray. ? ? ?Alexandra Berger ?10/13/2021 3:46 PM ? ? ? ? ? ? ?

## 2021-10-18 ENCOUNTER — Telehealth: Payer: Self-pay | Admitting: Internal Medicine

## 2021-10-18 NOTE — Telephone Encounter (Signed)
Called pt with appt with Dr. Julien Nordmann. No answer and no vm. I kept getting a busy signal. Mailed updated calendar to pt.  ?

## 2021-10-31 ENCOUNTER — Other Ambulatory Visit: Payer: Self-pay

## 2021-10-31 ENCOUNTER — Inpatient Hospital Stay: Payer: Medicare HMO | Attending: Internal Medicine

## 2021-10-31 ENCOUNTER — Inpatient Hospital Stay: Payer: Medicare HMO | Admitting: Internal Medicine

## 2021-10-31 ENCOUNTER — Encounter: Payer: Self-pay | Admitting: Internal Medicine

## 2021-10-31 ENCOUNTER — Encounter: Payer: Self-pay | Admitting: *Deleted

## 2021-10-31 VITALS — BP 115/77 | HR 82 | Temp 97.3°F | Resp 16 | Wt 191.6 lb

## 2021-10-31 DIAGNOSIS — I10 Essential (primary) hypertension: Secondary | ICD-10-CM

## 2021-10-31 DIAGNOSIS — C3432 Malignant neoplasm of lower lobe, left bronchus or lung: Secondary | ICD-10-CM

## 2021-10-31 DIAGNOSIS — C7A090 Malignant carcinoid tumor of the bronchus and lung: Secondary | ICD-10-CM | POA: Diagnosis not present

## 2021-10-31 DIAGNOSIS — Z87891 Personal history of nicotine dependence: Secondary | ICD-10-CM | POA: Diagnosis not present

## 2021-10-31 DIAGNOSIS — E119 Type 2 diabetes mellitus without complications: Secondary | ICD-10-CM | POA: Diagnosis not present

## 2021-10-31 DIAGNOSIS — Z800 Family history of malignant neoplasm of digestive organs: Secondary | ICD-10-CM | POA: Diagnosis not present

## 2021-10-31 DIAGNOSIS — R911 Solitary pulmonary nodule: Secondary | ICD-10-CM

## 2021-10-31 LAB — CBC WITH DIFFERENTIAL (CANCER CENTER ONLY)
Abs Immature Granulocytes: 0.01 10*3/uL (ref 0.00–0.07)
Basophils Absolute: 0 10*3/uL (ref 0.0–0.1)
Basophils Relative: 0 %
Eosinophils Absolute: 0.1 10*3/uL (ref 0.0–0.5)
Eosinophils Relative: 1 %
HCT: 40.3 % (ref 36.0–46.0)
Hemoglobin: 13.1 g/dL (ref 12.0–15.0)
Immature Granulocytes: 0 %
Lymphocytes Relative: 17 %
Lymphs Abs: 0.9 10*3/uL (ref 0.7–4.0)
MCH: 30.5 pg (ref 26.0–34.0)
MCHC: 32.5 g/dL (ref 30.0–36.0)
MCV: 93.9 fL (ref 80.0–100.0)
Monocytes Absolute: 0.3 10*3/uL (ref 0.1–1.0)
Monocytes Relative: 6 %
Neutro Abs: 4.1 10*3/uL (ref 1.7–7.7)
Neutrophils Relative %: 76 %
Platelet Count: 185 10*3/uL (ref 150–400)
RBC: 4.29 MIL/uL (ref 3.87–5.11)
RDW: 13.1 % (ref 11.5–15.5)
WBC Count: 5.4 10*3/uL (ref 4.0–10.5)
nRBC: 0 % (ref 0.0–0.2)

## 2021-10-31 LAB — CMP (CANCER CENTER ONLY)
ALT: 15 U/L (ref 0–44)
AST: 19 U/L (ref 15–41)
Albumin: 3.8 g/dL (ref 3.5–5.0)
Alkaline Phosphatase: 74 U/L (ref 38–126)
Anion gap: 7 (ref 5–15)
BUN: 18 mg/dL (ref 8–23)
CO2: 30 mmol/L (ref 22–32)
Calcium: 9.2 mg/dL (ref 8.9–10.3)
Chloride: 103 mmol/L (ref 98–111)
Creatinine: 1.09 mg/dL — ABNORMAL HIGH (ref 0.44–1.00)
GFR, Estimated: 55 mL/min — ABNORMAL LOW (ref >60)
Glucose, Bld: 70 mg/dL (ref 70–99)
Potassium: 4.1 mmol/L (ref 3.5–5.1)
Sodium: 140 mmol/L (ref 135–145)
Total Bilirubin: 0.7 mg/dL (ref 0.3–1.2)
Total Protein: 6.7 g/dL (ref 6.5–8.1)

## 2021-10-31 NOTE — Progress Notes (Signed)
Oncology Nurse Navigator Documentation ? ? ?  10/31/2021  ?  3:00 PM 10/13/2021  ?  9:00 AM 10/12/2021  ? 10:00 AM 10/10/2021  ? 10:00 AM 09/08/2021  ? 12:00 PM  ?Oncology Nurse Navigator Flowsheets  ?Abnormal Finding Date 06/12/2021      ?Confirmed Diagnosis Date 08/29/2021      ?Diagnosis Status Confirmed Diagnosis Complete      ?Planned Course of Treatment Surgery      ?Phase of Treatment Surgery      ?Surgery Actual Start Date: 09/28/2021      ?Navigator Follow Up Date:    10/31/2021 09/25/2021  ?Navigator Follow Up Reason:    New Patient Appointment New Patient Appointment  ?Navigation Complete Date: 10/31/2021      ?Post Navigation: Continue to Follow Patient? No      ?Reason Not Navigating Patient: No Treatment, Observation Only      ?Navigator Location [facility]  ?Referral Date to RadOnc/MedOnc     09/08/2021  ?Navigator Encounter Type Clinic/MDC;Initial MedOnc Appt/Treatment Plan Review Other: Appt/Treatment Plan Review Other:  ?Treatment Initiated Date 09/28/2021      ?Patient Visit Type Initial;MedOnc/I met patient and her daughter today.  Per Dr. Julien Nordmann patient is dx with stage I carcinoid lung cancer and tx plan is observation follow up in one year. No navigation needs identified. I helped to explain plan of care and added information to AVS.    Other   ?Treatment Phase Post-Tx Follow-up   Other   ?Barriers/Navigation Needs Education Coordination of Care Coordination of Care Coordination of Care Coordination of Care  ?Education Newly Diagnosed Cancer Education;Other      ?Interventions Education;Psycho-Social Support Coordination of Care Coordination of Care Coordination of Care Coordination of Care  ?Acuity Level 2-Minimal Needs (1-2 Barriers Identified) Level 2-Minimal Needs (1-2 Barriers Identified) Level 2-Minimal Needs (1-2 Barriers Identified) Level 2-Minimal Needs (1-2 Barriers Identified) Level 2-Minimal Needs (1-2 Barriers Identified)   ?Coordination of Care  Appts Other Other Other  ?Education Method Verbal;Other      ?Time Spent with Patient 30 30 30 30 30   ?  ?

## 2021-10-31 NOTE — Progress Notes (Signed)
? ? [name] ?[phone]   [phone] ? ?CONSULT NOTE ? ?REFERRING PHYSICIAN: Dr. Melodie Bouillon ? ?REASON FOR CONSULTATION:  ?71 years old white female recently diagnosed with carcinoid tumor ? ?HPI ?Alexandra Berger is a 71 y.o. female with past medical history significant for hypertension, dyslipidemia, GERD, chronic back pain, diabetes mellitus, anxiety and depression as well as memory issues.  The patient was seen by her cardiologist and had CT cardiac scoring scan performed on June 12, 2021.  Incidentally it showed 2.5 cm left lower lobe pulmonary nodule.  She was referred to Dr. [name] and a PET scan on July 06, 2021 showed 2.4 cm well-circumscribed pulmonary nodule in the anterior left lower lobe with mild hypermetabolism suspicious for pulmonary neuroendocrine tumor versus low-grade carcinoma.  There was no evidence of metastatic disease.  The patient was seen and by Dr. [name] and she had a bronchoscopy on 08/29/2021 that showed neuroendocrine carcinoma.  The patient was referred to Dr. [name].  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  It measured 2.1 cm.  There was no evidence for visceral pleural or lymphovascular invasion.  The sampled lymph nodes were negative for malignancy. ?Dr. [name] kindly referred the patient to me today for evaluation and recommendation regarding her condition. ?When seen today she is feeling fine with no concerning complaints.  She denied having any chest pain, shortness of breath, cough or hemoptysis.  She has no nausea, vomiting, diarrhea or constipation.  She has no significant night sweats but lost several pounds recently.  She has no headache or visual changes. ?Family history significant for mother and father with heart disease.  Brother had colon cancer in his 55s. ?The patient is  single and has 2 children.  She was accompanied today by her daughter Alexandra Berger.  The patient has another son Alexandra Berger who is 82 with Down syndrome.  She lives in [place] with her grandson and her son.  The patient working in several jobs including the receptionist.  She has a history of smoking less than 1 pack/day for around 40 years and quit 9 years ago.  She also drinks alcohol at regular basis and no history of drug abuse. ? ?HPI ? ?Past Medical History:  ?Diagnosis Date  ? Anxiety and depression   ? Chronic back pain   ? Diabetes mellitus without complication ([place])   ? DJD (degenerative joint disease)   ? Frequent PVCs   ? GERD (gastroesophageal reflux disease)   ? Hyperlipidemia   ? Hypertension   ? Memory difficulties   ? ? ?Past Surgical History:  ?Procedure Laterality Date  ? ABDOMINAL HYSTERECTOMY    ? BRONCHIAL BIOPSY  08/29/2021  ? Procedure: BRONCHIAL BIOPSIES;  Surgeon: Garner Nash, DO;  Location: [facility];  Service: Pulmonary;;  ? BRONCHIAL BRUSHINGS  08/29/2021  ? Procedure: BRONCHIAL BRUSHINGS;  Surgeon: Garner Nash, DO;  Location: [facility];  Service: Pulmonary;;  ? BRONCHIAL NEEDLE ASPIRATION BIOPSY  08/29/2021  ? Procedure: BRONCHIAL NEEDLE ASPIRATION BIOPSIES;  Surgeon: Garner Nash, DO;  Location: [facility];  Service: Pulmonary;;  ? CARDIAC CATHETERIZATION    ? 2014  ? DILATION AND CURETTAGE OF UTERUS    ? EYE SURGERY Left   ? X 2  ? INTERCOSTAL NERVE BLOCK Left 09/28/2021  ? Procedure: INTERCOSTAL NERVE BLOCK;  Surgeon: Lajuana Matte, MD;  Location: [facility];  Service: Thoracic;  Laterality: Left;  ? LYMPH NODE DISSECTION Left 09/28/2021  ? Procedure: LYMPH NODE DISSECTION;  Surgeon: Lajuana Matte, MD;  Location: [facility];  Service: Thoracic;  Laterality: Left;  ? VIDEO BRONCHOSCOPY WITH RADIAL ENDOBRONCHIAL ULTRASOUND  08/29/2021  ? Procedure: RADIAL ENDOBRONCHIAL ULTRASOUND;  Surgeon: Garner Nash, DO;  Location: [facility];  Service: Pulmonary;;  ? ? ?Family History   ?Problem Relation Age of Onset  ? CAD Mother   ? CAD Father   ? Heart attack Father   ? Colon cancer Brother 62  ? ? ?Social History ?Social History  ? ?Tobacco Use  ? Smoking status: Former  ?  Packs/day: 0.50  ?  Years: 7.00  ?  Pack years: 3.50  ?  Types: Cigarettes  ?  Quit date: 02/23/2002  ?  Years since quitting: 19.6  ? Smokeless tobacco: Never  ?Vaping Use  ? Vaping Use: Never used  ?Substance Use Topics  ? Alcohol use: Yes  ?  Comment: wine occassionally  ? Drug use: No  ? ? ?Allergies  ?Allergen Reactions  ? Sulfa Antibiotics Anaphylaxis and Shortness Of Breath  ? Pravastatin Other (See Comments)  ?  dizziness  ? Statins Other (See Comments)  ?  dizziness  ? ? ?Current Outpatient Medications  ?Medication Sig Dispense Refill  ? atorvastatin (LIPITOR) 80 MG tablet Take 1 tablet (80 mg total) by mouth daily. (Patient taking differently: Take 80 mg by mouth every evening.) 90 tablet 3  ? benazepril (LOTENSIN) 10 MG tablet Take 5 mg by mouth every evening.    ? Blood Glucose Monitoring Suppl (ONE TOUCH ULTRA 2) w/Device KIT SMARTSIG:1 Each Via Meter As Directed    ? Blood Glucose Monitoring Suppl (ONE TOUCH ULTRA 2) w/Device KIT USE TO TEST YOUR BLOOD SUGAR FINGER STICK ONCE A DAY    ? citalopram (CELEXA) 20 MG tablet Take 40 mg by mouth every evening.    ? cycloSPORINE (RESTASIS) 0.05 % ophthalmic emulsion Place 1 drop into both eyes 2 (two) times daily.     ? ergocalciferol (VITAMIN D2) 1.25 MG (50000 UT) capsule Take 50,000 Units by mouth every Sunday.    ? gabapentin (NEURONTIN) 300 MG capsule Take 300 mg by mouth 3 (three) times daily.     ? hydrOXYzine (ATARAX) 25 MG tablet Take 25 mg by mouth at bedtime as needed.    ? insulin detemir (LEVEMIR) 100 UNIT/ML injection Inject 10-24 Units into the skin See admin instructions. Inject 24 units subcutaneously in the morning & inject 10 units subcutaneously at night.    ? Lancets (ONETOUCH DELICA PLUS [id number]) MISC Apply 1 each topically daily.    ? ONETOUCH  ULTRA test strip SMARTSIG:Via Meter    ? pantoprazole (PROTONIX) 40 MG tablet Take 40 mg by mouth every evening.    ? ?No current facility-administered medications for this visit.  ? ? ?Review of Systems ? ?Constitutional: positive for weight loss ?Eyes: negative ?Ears, nose, mouth, throat, and face: negative ?Respiratory: negative ?Cardiovascular: negative ?Gastrointestinal: negative ?Genitourinary:negative ?Integument/breast: negative ?Hematologic/lymphatic: negative ?Musculoskeletal:negative ?Neurological: negative ?Behavioral/Psych: negative ?Endocrine: negative ?Allergic/Immunologic: negative ? ?Physical Exam ? ?VOH:YWVPX, healthy, no distress, well nourished, and well developed ?SKIN: skin color, texture, turgor are normal, no rashes or significant lesions ?HEAD: Normocephalic, No masses, lesions, tenderness or abnormalities ?EYES: normal, PERRLA, Conjunctiva are pink and non-injected ?EARS: External ears normal, Canals clear ?OROPHARYNX:no exudate, no erythema, and lips, buccal mucosa, and tongue normal  ?NECK: supple, no adenopathy, no JVD ?LYMPH:  no palpable lymphadenopathy, no hepatosplenomegaly ?BREAST:not examined ?LUNGS: clear to auscultation , and palpation ?HEART: regular rate & rhythm, no murmurs, and no gallops ?ABDOMEN:abdomen soft, non-tender, normal bowel sounds, and no masses or organomegaly ?BACK: Back symmetric, no curvature., No CVA tenderness ?EXTREMITIES:no joint deformities, effusion, or inflammation, no edema  ?NEURO: alert & oriented x 3 with fluent speech, no focal motor/sensory deficits ? ?PERFORMANCE STATUS: ECOG 1 ? ?LABORATORY DATA: ?Lab Results  ?Component Value Date  ? WBC 5.4 10/31/2021  ? HGB 13.1 10/31/2021  ? HCT 40.3 10/31/2021  ? MCV 93.9 10/31/2021  ? PLT 185 10/31/2021  ? ? ?  Chemistry   ?   ?Component Value Date/Time  ? NA 131 (L) 10/01/2021 0103  ? K 3.8 10/01/2021 0103  ? CL 104 10/01/2021 0103  ? CO2 22 10/01/2021 0103  ? BUN 28 (H) 10/01/2021 0103  ? CREATININE 1.07  (H) 10/01/2021 0103  ?    ?Component Value Date/Time  ? CALCIUM 7.8 (L) 10/01/2021 0103  ? ALKPHOS 41 09/30/2021 0059  ? AST 21 09/30/2021 0059  ? ALT 7 09/30/2021 0059  ? BILITOT 0.7 09/30/2021 0059  ?  ?

## 2021-10-31 NOTE — Patient Instructions (Signed)
Thank you for choosing [facility] to provide your care.   ?Should you have questions after your visit to the [facility]), please contact this office at [phone] between 8:30 AM and 4:30 PM.  ?Voice mails left after 4:00 PM may not be returned until the following business day.  ?Calls received after 4:30 PM will be answered by an off-site Nurse Triage Line. ?   ?Prescription Refills:  Please have your pharmacy contact us directly for most prescription requests.  Contact the office directly for refills of narcotics (pain medications). Allow 48-72 hours for refills. ? ?Appointments: Please contact the [facility] scheduling department [phone] for questions regarding [facility] appointment scheduling.  Contact the schedulers with any scheduling changes so that your appointment can be rescheduled in a timely manner.  ? ?Central Scheduling for [facility] [phone] - Call to schedule procedures such as PET scans, CT scans, MRI, Ultrasound, etc. ? ?To afford each patient quality time with our providers, please arrive 30 minutes before your scheduled appointment time.  If you arrive late for your appointment, you may be asked to reschedule.  We strive to give you quality time with our providers, and arriving late affects you and other patients whose appointments are after yours. If you are a no show for multiple scheduled visits, you may be dismissed from the clinic at the providers discretion.   ?  ?Resources: ?[place] [employer] [phone] for additional information on assistance programs or assistance connecting with community support programs   ?[place]  [phone]: Information regarding food stamps, Medicaid, and utility assistance ?[place] [employer] [place] [employer]'s shared-ride transportation service for eligible riders who have a disability that prevents them from riding the fixed route bus.   ?Medicare [place] [phone]  Helps people with Medicare understand their rights and benefits, navigate the Medicare system, and secure the quality healthcare they deserve ?[place] [phone] Assists patients locate various types of support and financial assistance ?Cancer Care: 1-800-813-HOPE (508)238-5875) Provides financial assistance, online support groups, medication/co-pay assistance.   ?Transportation Assistance for appointments at [facility] ? ?Again, thank you for choosing [facility] for your care.    ?  Lung Cancer ?Lung cancer is an abnormal growth of cancerous cells that forms a mass (malignant tumor) in a lung. There are several types of lung cancer. The types are based on the appearance of the tumor cells. The two most common types are: ?Non-small cell lung cancer. This type of lung cancer is the most common type. Non-small cell lung cancers include squamous cell carcinoma, adenocarcinoma, and large cell carcinoma. ?Small cell lung cancer. In this type of lung cancer, abnormal cells are smaller than those of non-small cell lung cancer. Small cell lung cancer gets worse (progresses) faster than non-small cell lung cancer. ?What are the causes? ?The most common cause of lung cancer is smoking tobacco. The second most common cause is exposure to a chemical called radon. ?What increases the risk? ?You are more likely to develop this condition if: ?You smoke tobacco. ?You have been exposed to: ?Secondhand tobacco smoke. ?Radon gas. ?Uranium. ?Asbestos. ?Arsenic in drinking water. ?Air pollution and diesel exhaust. ?You have a family or personal history of lung cancer. ?You have had lung radiation therapy in the past. ?You are older than age 71. ?What are the signs or symptoms? ?In the early stages, you may not have any symptoms. As the cancer progresses, symptoms may include: ?A lasting  cough, possibly with blood. ?Fatigue. ?Unexplained weight loss. ?Shortness of breath. ?High-pitched whistling sounds  when you breathe, most often when you breathe out (wheezing). ?Chest pain. ?Loss of appetite. ?Symptoms of advanced lung cancer include: ?Hoarseness. ?Bone or joint pain. ?Weakness. ?Change in the structure of the fingernails (clubbing), so that the nail looks like an upside-down spoon. ?Swelling of the face or arms. ?Inability to move the face (paralysis). ?Drooping eyelids. ?How is this diagnosed? ?This condition may be diagnosed based on: ?Your symptoms and medical history. ?A physical exam. ?A chest X-ray. ?A CT scan. ?Blood tests. ?Sputum tests. ?Removal of a sample of lung tissue (lung biopsy) for testing. ?Your cancer will be assessed (staged) to determine how severe it is and how much it has spread (metastasized). ?How is this treated? ?Treatment depends on the type and stage of your cancer. Treatment may include one or more of the following: ?Surgery to remove as much of the cancer as possible. Lymph nodes in the area may be removed and tested for cancer as well. ?Medicines that kill cancer cells (chemotherapy). ?High-energy rays that kill cancer cells (radiation therapy). ?Targeted therapy. This targets specific parts of cancer cells and the area around them to block the growth and spread of the cancer. Targeted therapy can help limit the damage to healthy cells. ?Immunotherapy. This treatment uses a person's own immune system to fight cancer by either boosting the immune system or changing how the immune system works. ?Follow these instructions at home: ? ?Do not use any products that contain nicotine or tobacco. These products include cigarettes, chewing tobacco, and vaping devices, such as e-cigarettes. If you need help quitting, ask your health care provider. ?Do not drink alcohol. ?If you are admitted to the hospital, make sure your cancer specialist (oncologist) is aware. Your cancer may affect your treatment for other conditions. ?Take over-the-counter and prescription medicines only as told by your  health care provider. ?Work with your health care provider to manage any side effects of treatment. ?Keep all follow-up visits. This is important. ?Where to find support ?Consider joining a local support group for people who have been diagnosed with lung cancer. ?Where to find more information ?American Cancer Society: www.cancer.org ?[place] www.cancer.gov ?Contact a health care provider if you: ?Lose weight without trying. ?Have a persistent cough and wheezing. ?Feel short of breath. ?Get tired easily. ?Have bone or joint pain. ?Have difficulty swallowing. ?Notice that your voice is changing or getting hoarse. ?Have pain that does not get better with medicine. ?Get help right away if you: ?Cough up blood. ?Have chest pain or new breathing problems. ?Have a fever. ?Have swelling in an ankle, leg, or arm, or the face or neck. ?Have paralysis in your face. ?Are very confused. ?Have a drooping eyelid. ?These symptoms may represent a serious problem that is an emergency. Do not wait to see if the symptoms will go away. Get medical help right away. Call your local emergency services (911 in the [place] Do not drive yourself to the hospital. ?Summary ?Lung cancer is an abnormal growth of cancerous cells that forms a mass (malignant tumor) in a lung. ?There are several types of lung cancer. The types are based on the appearance of the tumor cells. The two most common types are non-small cell and small cell. ?The most common cause of lung cancer is smoking tobacco. ?Early symptoms include a lasting cough, possibly with blood, and fatigue, unexplained weight loss, and shortness of breath. ?After diagnosis, treatment  depends on the type and stage of your cancer. ?This information is not intended to replace advice given to you by your health care provider. Make sure you discuss any questions you have with your health care provider. ?Document Revised: 11/30/2020 Document Reviewed: 11/30/2020 ?Elsevier  Patient Education ? [place]. ? ?

## 2021-11-06 ENCOUNTER — Other Ambulatory Visit: Payer: Self-pay | Admitting: Thoracic Surgery (Cardiothoracic Vascular Surgery)

## 2021-11-06 DIAGNOSIS — Z902 Acquired absence of lung [part of]: Secondary | ICD-10-CM

## 2021-11-10 ENCOUNTER — Ambulatory Visit
Admission: RE | Admit: 2021-11-10 | Discharge: 2021-11-10 | Disposition: A | Discharge status: Home / Self Care | Payer: Medicare HMO | Source: Ambulatory Visit | Attending: Thoracic Surgery (Cardiothoracic Vascular Surgery) | Admitting: Thoracic Surgery (Cardiothoracic Vascular Surgery)

## 2021-11-10 ENCOUNTER — Ambulatory Visit (INDEPENDENT_AMBULATORY_CARE_PROVIDER_SITE_OTHER): Payer: Self-pay | Admitting: Thoracic Surgery (Cardiothoracic Vascular Surgery)

## 2021-11-10 VITALS — BP 87/69 | HR 92 | Resp 20 | Ht 66.0 in | Wt 191.0 lb

## 2021-11-10 DIAGNOSIS — Z902 Acquired absence of lung [part of]: Secondary | ICD-10-CM | POA: Diagnosis not present

## 2021-11-10 DIAGNOSIS — J90 Pleural effusion, not elsewhere classified: Secondary | ICD-10-CM | POA: Diagnosis not present

## 2021-11-10 NOTE — H&P (View-Only) (Signed)
      [address]       [place] [address]             [phone]        Tessie Swanson-Bagley [place] Medical Record #[id number] Date of Birth: [birth year]  Referring: Garner Nash, DO Primary Care: Faustino Congress, NP Primary Cardiologist:None  Reason for visit:   follow-up  History of Present Illness:     71 year old patient presents for 1 month follow-up appointment.  She does endorse some shortness of breath with ambulation.  She denies any incisional pain.  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  She has a residual left-sided pleural effusion and is symptomatic from this.  I will refer her back to Dr. [name] for thoracentesis.  I will follow-up with her virtually in 1 month with a chest x-ray.   Lajuana Matte 11/10/2021 2:09 PM

## 2021-11-10 NOTE — Progress Notes (Signed)
      [address]       [place] [address]             [phone]        Carlota Swanson-Bagley [place] Medical Record #[id number] Date of Birth: [birth year]  Referring: Garner Nash, DO Primary Care: Faustino Congress, NP Primary Cardiologist:None  Reason for visit:   follow-up  History of Present Illness:     71 year old patient presents for 1 month follow-up appointment.  She does endorse some shortness of breath with ambulation.  She denies any incisional pain.  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  She has a residual left-sided pleural effusion and is symptomatic from this.  I will refer her back to Dr. [name] for thoracentesis.  I will follow-up with her virtually in 1 month with a chest x-ray.   Lajuana Matte 11/10/2021 2:09 PM

## 2021-11-13 ENCOUNTER — Telehealth: Payer: Self-pay | Admitting: Pulmonary Disease

## 2021-11-13 DIAGNOSIS — J90 Pleural effusion, not elsewhere classified: Secondary | ICD-10-CM

## 2021-11-13 NOTE — Telephone Encounter (Signed)
PCCM:  I called and spoke with the patient's daughter.  Planning to get set up for a outpatient thoracentesis.  We will try to get this done in the afternoon on Friday of this week at [facility].  To be set up with Dr. Tamala Julian or myself.  Garner Nash, DO [facility] 11/13/2021 12:40 PM

## 2021-11-13 NOTE — Telephone Encounter (Signed)
-----   Message from Synthia Innocent sent at 11/10/2021  2:21 PM EDT ----- Good afternoon,   Dr [name] has placed a referral on this patient for a thoracentesis. Can you please assist me with scheduling this?  Thanks

## 2021-11-17 ENCOUNTER — Ambulatory Visit (HOSPITAL_COMMUNITY): Payer: Medicare HMO

## 2021-11-17 ENCOUNTER — Encounter (HOSPITAL_COMMUNITY): Payer: Self-pay | Admitting: Internal Medicine

## 2021-11-17 ENCOUNTER — Encounter (HOSPITAL_COMMUNITY)
Admission: RE | Disposition: A | Discharge status: Home / Self Care | Payer: Self-pay | Source: Home / Self Care | Attending: Internal Medicine

## 2021-11-17 ENCOUNTER — Ambulatory Visit (HOSPITAL_COMMUNITY)
Admission: RE | Admit: 2021-11-17 | Discharge: 2021-11-17 | Disposition: A | Discharge status: Home / Self Care | Payer: Medicare HMO | Attending: Internal Medicine | Admitting: Internal Medicine

## 2021-11-17 DIAGNOSIS — J90 Pleural effusion, not elsewhere classified: Secondary | ICD-10-CM | POA: Diagnosis present

## 2021-11-17 DIAGNOSIS — Z9889 Other specified postprocedural states: Secondary | ICD-10-CM | POA: Insufficient documentation

## 2021-11-17 DIAGNOSIS — Z902 Acquired absence of lung [part of]: Secondary | ICD-10-CM | POA: Insufficient documentation

## 2021-11-17 DIAGNOSIS — R846 Abnormal cytological findings in specimens from respiratory organs and thorax: Secondary | ICD-10-CM | POA: Diagnosis not present

## 2021-11-17 DIAGNOSIS — Z48813 Encounter for surgical aftercare following surgery on the respiratory system: Secondary | ICD-10-CM | POA: Diagnosis not present

## 2021-11-17 DIAGNOSIS — R0602 Shortness of breath: Secondary | ICD-10-CM | POA: Insufficient documentation

## 2021-11-17 HISTORY — PX: THORACENTESIS: SHX235

## 2021-11-17 LAB — BODY FLUID CELL COUNT WITH DIFFERENTIAL
Eos, Fluid: 1 %
Lymphs, Fluid: 78 %
Monocyte-Macrophage-Serous Fluid: 13 % — ABNORMAL LOW (ref 50–90)
Neutrophil Count, Fluid: 8 % (ref 0–25)
Total Nucleated Cell Count, Fluid: 536 cu mm (ref 0–1000)

## 2021-11-17 LAB — PROTEIN, PLEURAL OR PERITONEAL FLUID: Total protein, fluid: <3 g/dL

## 2021-11-17 LAB — GLUCOSE, PLEURAL OR PERITONEAL FLUID: Glucose, Fluid: 77 mg/dL

## 2021-11-17 LAB — LACTATE DEHYDROGENASE, PLEURAL OR PERITONEAL FLUID: LD, Fluid: 78 U/L — ABNORMAL HIGH (ref 3–23)

## 2021-11-17 LAB — ALBUMIN, PLEURAL OR PERITONEAL FLUID: Albumin, Fluid: 1.9 g/dL

## 2021-11-17 SURGERY — THORACENTESIS
Anesthesia: Topical | Laterality: Left

## 2021-11-17 NOTE — Op Note (Signed)
Thoracentesis Procedure Note  Audryanna Zurita  [id number]  [birth year]  Date:11/17/21  Time:2:09 PM   Provider Performing:Danyl Deems L Cherilyn Sautter   Procedure: Thoracentesis with imaging guidance (02233)  Indication(s) Pleural Effusion  Consent Risks of the procedure as well as the alternatives and risks of each were explained to the patient and/or caregiver.  Consent for the procedure was obtained and is signed in the bedside chart  Anesthesia Topical only with 1% lidocaine   Time Out Verified patient identification, verified procedure, site/side was marked, verified correct patient position, special equipment/implants available, medications/allergies/relevant history reviewed, required imaging and test results available.  Sterile Technique Maximal sterile technique including full sterile barrier drape, hand hygiene, sterile gown, sterile gloves, mask, hair covering, sterile ultrasound probe cover (if used).  Procedure Description Ultrasound was used to identify appropriate pleural anatomy for placement and overlying skin marked.  Area of drainage cleaned and draped in sterile fashion. Lidocaine was used to anesthetize the skin and subcutaneous tissue.  400 cc's of yellow appearing fluid was drained from the left pleural space. Catheter then removed and bandaid applied to site.  Complications/Tolerance None; patient tolerated the procedure well. Chest X-ray is ordered to confirm no post-procedural complication.  EBL Minimal  Specimen(s) Pleural fluid  Garner Nash, DO [facility] 11/17/2021 2:09 PM

## 2021-11-17 NOTE — Interval H&P Note (Signed)
History and Physical Interval Note:  11/17/2021 2:08 PM  Alexandra Berger  has presented today for surgery, with the diagnosis of Pleural effusion.  The various methods of treatment have been discussed with the patient and family. After consideration of risks, benefits and other options for treatment, the patient has consented to  Procedure(s): THORACENTESIS (Left) as a surgical intervention.  The patient's history has been reviewed, patient examined, no change in status, stable for surgery.  I have reviewed the patient's chart and labs.  Questions were answered to the patient's satisfaction.     [place]

## 2021-11-21 ENCOUNTER — Ambulatory Visit: Payer: Medicare HMO | Admitting: Cardiovascular Disease

## 2021-11-21 ENCOUNTER — Encounter (HOSPITAL_COMMUNITY): Payer: Self-pay | Admitting: Pulmonary Disease

## 2021-11-21 LAB — CYTOLOGY - NON PAP

## 2021-11-29 ENCOUNTER — Ambulatory Visit: Payer: Medicare HMO | Admitting: General Practice

## 2021-12-12 NOTE — Progress Notes (Unsigned)
Cardiology Clinic Note   Patient Name: Alexandra Berger Date of Encounter: 12/12/2021  Primary Care Provider:  Faustino Congress, NP Primary Cardiologist:  Quay Burow, MD  Patient Profile    Alexandra Berger 71 year old female presents the clinic today for follow-up evaluation of her essential hypertension and hyperlipidemia.  Past Medical History    Past Medical History:  Diagnosis Date   Anxiety and depression    Chronic back pain    Diabetes mellitus without complication (HCC)    DJD (degenerative joint disease)    Frequent PVCs    GERD (gastroesophageal reflux disease)    Hyperlipidemia    Hypertension    Memory difficulties    Past Surgical History:  Procedure Laterality Date   ABDOMINAL HYSTERECTOMY     BRONCHIAL BIOPSY  08/29/2021   Procedure: BRONCHIAL BIOPSIES;  Surgeon: Garner Nash, DO;  Location: [facility];  Service: Pulmonary;;   BRONCHIAL BRUSHINGS  08/29/2021   Procedure: BRONCHIAL BRUSHINGS;  Surgeon: Garner Nash, DO;  Location: [facility];  Service: Pulmonary;;   BRONCHIAL NEEDLE ASPIRATION BIOPSY  08/29/2021   Procedure: BRONCHIAL NEEDLE ASPIRATION BIOPSIES;  Surgeon: Garner Nash, DO;  Location: [facility];  Service: Pulmonary;;   CARDIAC CATHETERIZATION     2014   DILATION AND CURETTAGE OF UTERUS     EYE SURGERY Left    X 2   INTERCOSTAL NERVE BLOCK Left 09/28/2021   Procedure: INTERCOSTAL NERVE BLOCK;  Surgeon: Lajuana Matte, MD;  Location: [facility];  Service: Thoracic;  Laterality: Left;   LYMPH NODE DISSECTION Left 09/28/2021   Procedure: LYMPH NODE DISSECTION;  Surgeon: Lajuana Matte, MD;  Location: [facility];  Service: Thoracic;  Laterality: Left;   THORACENTESIS Left 11/17/2021   Procedure: [place] Robinsons;  Surgeon: Garner Nash, DO;  Location: [facility];  Service: Pulmonary;  Laterality: Left;   VIDEO BRONCHOSCOPY WITH RADIAL ENDOBRONCHIAL ULTRASOUND  08/29/2021   Procedure: RADIAL ENDOBRONCHIAL ULTRASOUND;   Surgeon: Garner Nash, DO;  Location: [facility];  Service: Pulmonary;;    Allergies  Allergies  Allergen Reactions   Sulfa Antibiotics Anaphylaxis and Shortness Of Breath   Pravastatin Other (See Comments)    dizziness   Statins Other (See Comments)    dizziness    History of Present Illness    Alexandra Berger has a PMH of HTN, HLD, syncope, collapse, elevated calcium score, PVCs, type 2 diabetes and NSVT.  She was initially referred by her PCP for evaluation of her orthostatic hypotension.  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  She is a former smoker and quit 20 years ago.  Her parents both had CABG.  She was seen by Dr. [name].  During that time she denied chest pain and shortness of breath.  She had moved from [place] to [place] to be closer to family.  She was living with her son who has Down syndrome.  She reported being more active since moving.  She does not drive.  She reported an episode of syncope.  She was seen in follow-up on 08/22/2021 by Dr. [name].  Her cardiac event monitor showed frequent PVCs, runs of NSVT and also SVT.  Her coronary calcium score was 158 with calcium noted in the LAD.  She denied chest pain.  Her lipid panel 03/22/2021 showed an LDL of 135.  She was compliant with atorvastatin.  She underwent thoracentesis on 11/17/2020 by Dr. [name].  She did note some shortness of breath with  ambulation.  She is status post left lower lobectomy (carcinoid tumor).  Follow-up with Dr. [name] was planned for 1 month and follow-up chest x-ray.  She presents to the clinic today for follow-up evaluation states***  *** denies chest pain, shortness of breath, lower extremity edema, fatigue, palpitations, melena, hematuria, hemoptysis, diaphoresis, weakness, presyncope, syncope, orthopnea, and PND.  Home Medications    Prior to Admission medications   Medication Sig Start Date End Date Taking? Authorizing Provider   atorvastatin (LIPITOR) 80 MG tablet Take 1 tablet (80 mg total) by mouth daily. Patient taking differently: Take 80 mg by mouth every evening. 08/22/21   [name], MD  benazepril (LOTENSIN) 10 MG tablet Take 5 mg by mouth every evening.    [provider]  Blood Glucose Monitoring Suppl (ONE TOUCH ULTRA 2) w/Device KIT SMARTSIG:1 Each Via Meter As Directed 10/24/21   [provider]  Blood Glucose Monitoring Suppl (ONE TOUCH ULTRA 2) w/Device KIT USE TO TEST YOUR BLOOD SUGAR FINGER STICK ONCE A DAY    [provider]  citalopram (CELEXA) 20 MG tablet Take 40 mg by mouth every evening. 03/05/14   [provider]  cycloSPORINE (RESTASIS) 0.05 % ophthalmic emulsion Place 1 drop into both eyes 2 (two) times daily.     [provider]  ergocalciferol (VITAMIN D2) 1.25 MG (50000 UT) capsule Take 50,000 Units by mouth every Sunday.    [provider]  gabapentin (NEURONTIN) 300 MG capsule Take 300 mg by mouth 3 (three) times daily.     [provider]  hydrOXYzine (ATARAX) 25 MG tablet Take 25 mg by mouth at bedtime as needed. 09/26/21   [provider]  insulin detemir (LEVEMIR) 100 UNIT/ML injection Inject 10-24 Units into the skin See admin instructions. Inject 24 units subcutaneously in the morning & inject 10 units subcutaneously at night.    [provider]  Lancets [facility] DELICA PLUS [id number]) MISC Apply 1 each topically daily. 10/16/21   [provider]  ONETOUCH ULTRA test strip SMARTSIG:Via Meter 10/01/21   [provider]  pantoprazole (PROTONIX) 40 MG tablet Take 40 mg by mouth every evening.    [provider]    Family History    Family History  Problem Relation Age of Onset   CAD Mother    CAD Father    Heart attack Father    Colon cancer Brother 50   She indicated that the status of her mother is unknown. She indicated that the status of her father is unknown. She  indicated that her brother is deceased.  Social History    Social History   Socioeconomic History   Marital status: Widowed    Spouse name: Not on file   Number of children: Not on file   Years of education: Not on file   Highest education level: Not on file  Occupational History   Occupation: retired    Comment: Airline pilot  Tobacco Use   Smoking status: Former    Packs/day: 0.50    Years: 7.00    Total pack years: 3.50    Types: Cigarettes    Quit date: 02/23/2002    Years since quitting: 19.8   Smokeless tobacco: Never  Vaping Use   Vaping Use: Never used  Substance and Sexual Activity   Alcohol use: Yes    Comment: wine occassionally   Drug use: No   Sexual activity: Not on file  Other Topics Concern   Not on file  Social History Narrative   Not on file   Social Determinants of Health   Financial Resource Strain: Not on file  Food Insecurity: Not on file  Transportation Needs: Not on file  Physical Activity: Not on file  Stress: Not on file  Social Connections: Not on file  Intimate Partner Violence: Not on file     Review of Systems    General:  No chills, fever, night sweats or weight changes.  Cardiovascular:  No chest pain, dyspnea on exertion, edema, orthopnea, palpitations, paroxysmal nocturnal dyspnea. Dermatological: No rash, lesions/masses Respiratory: No cough, dyspnea Urologic: No hematuria, dysuria Abdominal:   No nausea, vomiting, diarrhea, bright red blood per rectum, melena, or hematemesis Neurologic:  No visual changes, wkns, changes in mental status. All other systems reviewed and are otherwise negative except as noted above.  Physical Exam    VS:  There were no vitals taken for this visit. , BMI There is no height or weight on file to calculate BMI. GEN: Well nourished, well developed, in no acute distress. HEENT: normal. Neck: Supple, no JVD, carotid bruits, or masses. Cardiac: RRR, no murmurs, rubs, or gallops. No clubbing, cyanosis,  edema.  Radials/DP/PT 2+ and equal bilaterally.  Respiratory:  Respirations regular and unlabored, clear to auscultation bilaterally. GI: Soft, nontender, nondistended, BS + x 4. MS: no deformity or atrophy. Skin: warm and dry, no rash. Neuro:  Strength and sensation are intact. Psych: Normal affect.  Accessory Clinical Findings    Recent Labs: 10/31/2021: ALT 15; BUN 18; Creatinine 1.09; Hemoglobin 13.1; Platelet Count 185; Potassium 4.1; Sodium 140   Recent Lipid Panel No results found for: "CHOL", "TRIG", "HDL", "CHOLHDL", "VLDL", "LDLCALC", "LDLDIRECT"  ECG personally reviewed by me today- *** - No acute changes  Echocardiogram 09/11/2021  IMPRESSIONS     1. Left ventricular ejection fraction, by estimation, is 60 to 65%. The  left ventricle has normal function. The left ventricle has no regional  wall motion abnormalities. Left ventricular diastolic parameters were  normal.   2. Right ventricular systolic function is normal. The right ventricular  size is normal.   3. A small pericardial effusion is present. The pericardial effusion is  circumferential. There is no evidence of cardiac tamponade.   4. The mitral valve is normal in structure. Trivial mitral valve  regurgitation. No evidence of mitral stenosis.   5. The aortic valve is tricuspid. Aortic valve regurgitation is not  visualized. No aortic stenosis is present.   6. The inferior vena cava is normal in size with greater than 50%  respiratory variability, suggesting right atrial pressure of 3 mmHg.   Comparison(s): No prior Echocardiogram.   Assessment & Plan   1.   Essential hypertension-BP today***.  Well-controlled at home. Continue benazepril 10 mg daily Heart healthy low-sodium diet-salty 6 given Increase physical activity as tolerated Maintain blood pressure log  Hyperlipidemia-LDL***.  Previously referred to lipid clinic. Continue atorvastatin Heart healthy low-sodium high-fiber diet Increase  physical activity as tolerated  Syncope and collapse-no further episodes of lightheadedness dizziness presyncope or syncope.  Cardiac event monitor showed frequent PVCs, short runs of NSVT, and runs of SVT. Increase p.o. hydration Heart healthy low-sodium diet Increase physical activity as tolerated Lower extremity support stockings Follows with EP  Elevated coronary calcium-underwent coronary calcium score which was noted to be 158.  Plaque noted to be in LAD.  Continues to deny chest pain. Continue atorvastatin Heart healthy low-sodium diet-salty 6 given Increase physical activity as tolerated   Disposition:  Follow-up with Dr. [name] or me in 4-6 Months   Alexandra Ng. Penny Arrambide NP-C    12/12/2021, 12:09 PM [place] [address] Office [phone] Fax [phone]  Notice: This dictation was prepared with Dragon dictation along with smaller phrase technology. Any transcriptional errors that result from this process are unintentional and may not be corrected upon review.  I spent***minutes examining this patient, reviewing medications, and using patient centered shared decision making involving her cardiac care.  Prior to her visit I spent greater than 20 minutes reviewing her past medical history,  medications, and prior cardiac tests.

## 2021-12-15 ENCOUNTER — Telehealth: Payer: Self-pay | Admitting: Thoracic Surgery (Cardiothoracic Vascular Surgery)

## 2021-12-15 ENCOUNTER — Ambulatory Visit (INDEPENDENT_AMBULATORY_CARE_PROVIDER_SITE_OTHER): Payer: Medicare HMO | Admitting: General Practice

## 2021-12-15 ENCOUNTER — Encounter: Payer: Self-pay | Admitting: General Practice

## 2021-12-15 VITALS — BP 122/78 | HR 92 | Ht 66.0 in | Wt 183.0 lb

## 2021-12-15 DIAGNOSIS — R931 Abnormal findings on diagnostic imaging of heart and coronary circulation: Secondary | ICD-10-CM | POA: Diagnosis not present

## 2021-12-15 DIAGNOSIS — E782 Mixed hyperlipidemia: Secondary | ICD-10-CM | POA: Diagnosis not present

## 2021-12-15 DIAGNOSIS — I10 Essential (primary) hypertension: Secondary | ICD-10-CM | POA: Diagnosis not present

## 2021-12-15 DIAGNOSIS — R55 Syncope and collapse: Secondary | ICD-10-CM

## 2021-12-15 MED ORDER — EZETIMIBE 10 MG PO TABS: 10.0000 mg | ORAL_TABLET | Freq: Every day | ORAL | 9 refills | Status: DC

## 2022-05-07 DIAGNOSIS — E1121 Type 2 diabetes mellitus with diabetic nephropathy: Secondary | ICD-10-CM | POA: Diagnosis not present

## 2022-05-07 DIAGNOSIS — Z6831 Body mass index (BMI) 31.0-31.9, adult: Secondary | ICD-10-CM | POA: Diagnosis not present

## 2022-05-07 DIAGNOSIS — R202 Paresthesia of skin: Secondary | ICD-10-CM | POA: Diagnosis not present

## 2022-05-07 DIAGNOSIS — R69 Illness, unspecified: Secondary | ICD-10-CM | POA: Diagnosis not present

## 2022-05-22 DIAGNOSIS — R748 Abnormal levels of other serum enzymes: Secondary | ICD-10-CM | POA: Diagnosis not present

## 2022-05-31 ENCOUNTER — Other Ambulatory Visit: Payer: Self-pay | Admitting: Physician Assistant

## 2022-05-31 DIAGNOSIS — R748 Abnormal levels of other serum enzymes: Secondary | ICD-10-CM

## 2022-06-12 ENCOUNTER — Ambulatory Visit: Payer: Medicare HMO | Attending: Cardiovascular Disease | Admitting: Cardiovascular Disease

## 2022-06-21 ENCOUNTER — Encounter: Payer: Self-pay | Admitting: Cardiovascular Disease

## 2022-07-08 ENCOUNTER — Encounter (HOSPITAL_COMMUNITY): Payer: Self-pay | Admitting: *Deleted

## 2022-07-08 ENCOUNTER — Other Ambulatory Visit: Payer: Self-pay

## 2022-07-08 ENCOUNTER — Emergency Department (HOSPITAL_COMMUNITY)
Admission: EM | Admit: 2022-07-08 | Discharge: 2022-07-09 | Discharge status: Against Medical Advice | Payer: Medicare HMO | Attending: Emergency Medicine | Admitting: Emergency Medicine

## 2022-07-08 DIAGNOSIS — R519 Headache, unspecified: Secondary | ICD-10-CM | POA: Diagnosis not present

## 2022-07-08 DIAGNOSIS — W01198A Fall on same level from slipping, tripping and stumbling with subsequent striking against other object, initial encounter: Secondary | ICD-10-CM | POA: Insufficient documentation

## 2022-07-08 DIAGNOSIS — R42 Dizziness and giddiness: Secondary | ICD-10-CM | POA: Insufficient documentation

## 2022-07-08 DIAGNOSIS — Z7901 Long term (current) use of anticoagulants: Secondary | ICD-10-CM | POA: Diagnosis not present

## 2022-07-08 DIAGNOSIS — Z5321 Procedure and treatment not carried out due to patient leaving prior to being seen by health care provider: Secondary | ICD-10-CM | POA: Diagnosis not present

## 2022-07-08 HISTORY — DX: Syncope and collapse: R55

## 2022-07-08 LAB — COMPREHENSIVE METABOLIC PANEL
ALT: 60 U/L — ABNORMAL HIGH (ref 0–44)
AST: 51 U/L — ABNORMAL HIGH (ref 15–41)
Albumin: 3.5 g/dL (ref 3.5–5.0)
Alkaline Phosphatase: 92 U/L (ref 38–126)
Anion gap: 9 (ref 5–15)
BUN: 13 mg/dL (ref 8–23)
CO2: 31 mmol/L (ref 22–32)
Calcium: 9.1 mg/dL (ref 8.9–10.3)
Chloride: 100 mmol/L (ref 98–111)
Creatinine, Ser: 1.1 mg/dL — ABNORMAL HIGH (ref 0.44–1.00)
GFR, Estimated: 54 mL/min — ABNORMAL LOW (ref >60)
Glucose, Bld: 166 mg/dL — ABNORMAL HIGH (ref 70–99)
Potassium: 4.2 mmol/L (ref 3.5–5.1)
Sodium: 140 mmol/L (ref 135–145)
Total Bilirubin: 0.5 mg/dL (ref 0.3–1.2)
Total Protein: 6.1 g/dL — ABNORMAL LOW (ref 6.5–8.1)

## 2022-07-08 LAB — CBC WITH DIFFERENTIAL/PLATELET
Abs Immature Granulocytes: 0.01 10*3/uL (ref 0.00–0.07)
Basophils Absolute: 0 10*3/uL (ref 0.0–0.1)
Basophils Relative: 0 %
Eosinophils Absolute: 0.2 10*3/uL (ref 0.0–0.5)
Eosinophils Relative: 3 %
HCT: 41.7 % (ref 36.0–46.0)
Hemoglobin: 13.7 g/dL (ref 12.0–15.0)
Immature Granulocytes: 0 %
Lymphocytes Relative: 22 %
Lymphs Abs: 1.1 10*3/uL (ref 0.7–4.0)
MCH: 30.4 pg (ref 26.0–34.0)
MCHC: 32.9 g/dL (ref 30.0–36.0)
MCV: 92.5 fL (ref 80.0–100.0)
Monocytes Absolute: 0.3 10*3/uL (ref 0.1–1.0)
Monocytes Relative: 6 %
Neutro Abs: 3.3 10*3/uL (ref 1.7–7.7)
Neutrophils Relative %: 69 %
Platelets: 196 10*3/uL (ref 150–400)
RBC: 4.51 MIL/uL (ref 3.87–5.11)
RDW: 12.5 % (ref 11.5–15.5)
WBC: 4.8 10*3/uL (ref 4.0–10.5)
nRBC: 0 % (ref 0.0–0.2)

## 2022-07-08 LAB — CBG MONITORING, ED: Glucose-Capillary: 160 mg/dL — ABNORMAL HIGH (ref 70–99)

## 2022-07-08 NOTE — ED Notes (Signed)
Pt stated that she does not want to wait and is leaving.

## 2022-07-08 NOTE — ED Provider Triage Note (Signed)
Emergency Medicine Provider Triage Evaluation Note  Alexandra Berger , a 72 y.o. female  was evaluated in triage.  Pt complains of a chemical fall went to get up from the chair and fell forward landing on the concrete striking the left side of her forehead, large goose egg noted to the area.  Endorsing pain along the side.  Patient reports she was on warfarin previously, however this is not documented anywhere in her chart.  Did get a little dizzy and lightheaded to this occurring, however she states this happens when she says goes from sitting to standing.  Review of Systems  Positive: Headache, trauma Negative: Chest pain. sob  Physical Exam  There were no vitals taken for this visit. Gen:   Awake, no distress   Resp:  Normal effort  MSK:   Moves extremities without difficulty  Other:  Large goose egg noted to the left forehead, moves all upper and lower extremities.   Medical Decision Making  Medically screening exam initiated at 8:47 PM.  Appropriate orders placed.  Marvette Schamp was informed that the remainder of the evaluation will be completed by another provider, this initial triage assessment does not replace that evaluation, and the importance of remaining in the ED until their evaluation is complete.     Claude Manges, PA-C 07/08/22 2054

## 2022-07-08 NOTE — ED Triage Notes (Signed)
Pt's daughter witnessed pt slumping over and falling out of chair, landing on and hitting her head on the carpet.  Per daughter, pt was out for approx 5 sec.  Hematoma to L forehead.  Pt presently alert to year and self. Pt states she was dizzy at time of fall, but is no longer dizzy.

## 2022-07-12 DIAGNOSIS — I951 Orthostatic hypotension: Secondary | ICD-10-CM | POA: Diagnosis not present

## 2022-07-12 DIAGNOSIS — R42 Dizziness and giddiness: Secondary | ICD-10-CM | POA: Diagnosis not present

## 2022-07-12 DIAGNOSIS — S0990XA Unspecified injury of head, initial encounter: Secondary | ICD-10-CM | POA: Diagnosis not present

## 2022-07-12 DIAGNOSIS — R296 Repeated falls: Secondary | ICD-10-CM | POA: Diagnosis not present

## 2022-08-12 ENCOUNTER — Observation Stay (HOSPITAL_COMMUNITY)
Admission: EM | Admit: 2022-08-12 | Discharge: 2022-08-14 | Disposition: A | Discharge status: Home / Self Care | Payer: Medicare HMO | Attending: Internal Medicine | Admitting: Internal Medicine

## 2022-08-12 ENCOUNTER — Emergency Department (HOSPITAL_COMMUNITY): Payer: Medicare HMO

## 2022-08-12 ENCOUNTER — Encounter (HOSPITAL_COMMUNITY): Payer: Self-pay | Admitting: Internal Medicine

## 2022-08-12 DIAGNOSIS — S199XXA Unspecified injury of neck, initial encounter: Secondary | ICD-10-CM | POA: Diagnosis not present

## 2022-08-12 DIAGNOSIS — Z79899 Other long term (current) drug therapy: Secondary | ICD-10-CM | POA: Diagnosis not present

## 2022-08-12 DIAGNOSIS — Z87891 Personal history of nicotine dependence: Secondary | ICD-10-CM | POA: Insufficient documentation

## 2022-08-12 DIAGNOSIS — S0990XA Unspecified injury of head, initial encounter: Secondary | ICD-10-CM | POA: Diagnosis not present

## 2022-08-12 DIAGNOSIS — R58 Hemorrhage, not elsewhere classified: Secondary | ICD-10-CM | POA: Diagnosis not present

## 2022-08-12 DIAGNOSIS — Z85118 Personal history of other malignant neoplasm of bronchus and lung: Secondary | ICD-10-CM | POA: Insufficient documentation

## 2022-08-12 DIAGNOSIS — R55 Syncope and collapse: Secondary | ICD-10-CM | POA: Diagnosis not present

## 2022-08-12 DIAGNOSIS — R000 Tachycardia, unspecified: Secondary | ICD-10-CM | POA: Diagnosis not present

## 2022-08-12 DIAGNOSIS — E119 Type 2 diabetes mellitus without complications: Secondary | ICD-10-CM | POA: Diagnosis not present

## 2022-08-12 DIAGNOSIS — M5030 Other cervical disc degeneration, unspecified cervical region: Secondary | ICD-10-CM | POA: Diagnosis not present

## 2022-08-12 DIAGNOSIS — R42 Dizziness and giddiness: Secondary | ICD-10-CM | POA: Diagnosis not present

## 2022-08-12 DIAGNOSIS — W1830XA Fall on same level, unspecified, initial encounter: Secondary | ICD-10-CM | POA: Diagnosis not present

## 2022-08-12 DIAGNOSIS — E118 Type 2 diabetes mellitus with unspecified complications: Secondary | ICD-10-CM

## 2022-08-12 DIAGNOSIS — Z902 Acquired absence of lung [part of]: Secondary | ICD-10-CM

## 2022-08-12 DIAGNOSIS — R296 Repeated falls: Secondary | ICD-10-CM | POA: Diagnosis not present

## 2022-08-12 DIAGNOSIS — M2578 Osteophyte, vertebrae: Secondary | ICD-10-CM | POA: Diagnosis not present

## 2022-08-12 DIAGNOSIS — E785 Hyperlipidemia, unspecified: Secondary | ICD-10-CM | POA: Diagnosis present

## 2022-08-12 DIAGNOSIS — I10 Essential (primary) hypertension: Secondary | ICD-10-CM | POA: Insufficient documentation

## 2022-08-12 DIAGNOSIS — E782 Mixed hyperlipidemia: Secondary | ICD-10-CM | POA: Diagnosis not present

## 2022-08-12 DIAGNOSIS — S0083XA Contusion of other part of head, initial encounter: Secondary | ICD-10-CM | POA: Insufficient documentation

## 2022-08-12 DIAGNOSIS — Z23 Encounter for immunization: Secondary | ICD-10-CM | POA: Diagnosis not present

## 2022-08-12 DIAGNOSIS — Z794 Long term (current) use of insulin: Secondary | ICD-10-CM | POA: Insufficient documentation

## 2022-08-12 DIAGNOSIS — S0121XA Laceration without foreign body of nose, initial encounter: Secondary | ICD-10-CM | POA: Diagnosis not present

## 2022-08-12 DIAGNOSIS — S0181XA Laceration without foreign body of other part of head, initial encounter: Secondary | ICD-10-CM | POA: Diagnosis not present

## 2022-08-12 LAB — CBC
HCT: 42.9 % (ref 36.0–46.0)
Hemoglobin: 14.1 g/dL (ref 12.0–15.0)
MCH: 30.5 pg (ref 26.0–34.0)
MCHC: 32.9 g/dL (ref 30.0–36.0)
MCV: 92.7 fL (ref 80.0–100.0)
Platelets: 186 10*3/uL (ref 150–400)
RBC: 4.63 MIL/uL (ref 3.87–5.11)
RDW: 13.2 % (ref 11.5–15.5)
WBC: 4.7 10*3/uL (ref 4.0–10.5)
nRBC: 0 % (ref 0.0–0.2)

## 2022-08-12 LAB — BASIC METABOLIC PANEL
Anion gap: 9 (ref 5–15)
BUN: 11 mg/dL (ref 8–23)
CO2: 29 mmol/L (ref 22–32)
Calcium: 9 mg/dL (ref 8.9–10.3)
Chloride: 100 mmol/L (ref 98–111)
Creatinine, Ser: 1.01 mg/dL — ABNORMAL HIGH (ref 0.44–1.00)
GFR, Estimated: 60 mL/min — ABNORMAL LOW (ref >60)
Glucose, Bld: 137 mg/dL — ABNORMAL HIGH (ref 70–99)
Potassium: 3.8 mmol/L (ref 3.5–5.1)
Sodium: 138 mmol/L (ref 135–145)

## 2022-08-12 LAB — CBG MONITORING, ED: Glucose-Capillary: 127 mg/dL — ABNORMAL HIGH (ref 70–99)

## 2022-08-12 LAB — TROPONIN I (HIGH SENSITIVITY): Troponin I (High Sensitivity): 4 ng/L (ref <18)

## 2022-08-12 MED ORDER — TETANUS-DIPHTH-ACELL PERTUSSIS 5-2.5-18.5 LF-MCG/0.5 IM SUSY
0.5000 mL | PREFILLED_SYRINGE | Freq: Once | INTRAMUSCULAR | Status: AC
  Administered 2022-08-12: 0.5 mL via INTRAMUSCULAR
  Filled 2022-08-12: qty 0.5

## 2022-08-12 MED ORDER — SODIUM CHLORIDE 0.9 % IV BOLUS
1000.0000 mL | Freq: Once | INTRAVENOUS | Status: AC
  Administered 2022-08-12: 1000 mL via INTRAVENOUS

## 2022-08-12 MED ORDER — POTASSIUM CHLORIDE CRYS ER 20 MEQ PO TBCR
40.0000 meq | EXTENDED_RELEASE_TABLET | Freq: Once | ORAL | Status: AC
  Administered 2022-08-12: 40 meq via ORAL
  Filled 2022-08-12: qty 2

## 2022-08-12 MED ORDER — BISOPROLOL FUMARATE 5 MG PO TABS
5.0000 mg | ORAL_TABLET | Freq: Every day | ORAL | Status: DC
  Administered 2022-08-13 – 2022-08-14 (×2): 5 mg via ORAL
  Filled 2022-08-12 (×3): qty 1

## 2022-08-12 NOTE — ED Triage Notes (Signed)
Patient BIB [facility] from home for evaluation of syncope, patient fell from standing and hit left side of forehead. Patient denies anticoagulation, is alert, oriented, and in no apparent distress at this time.

## 2022-08-12 NOTE — ED Provider Notes (Signed)
[facility] Provider Note  CSN: [id number] Arrival date & time: 08/12/22 1754  Chief Complaint(s) Loss of Consciousness  HPI Alexandra Berger is a 72 y.o. female with history of diabetes, lung cancer status post lobectomy presenting to the emergency department with syncope.  Patient and family report that she has had frequent episodes of syncope over the past few days.  She has had intermittent episodes of fainting over the past month but this has been worsening.  Her son-in-law reports that earlier today she had 2 episodes where she was laying down and suddenly said that she felt lightheaded and then seemed to lose consciousness for around 15 to 30 seconds.  She had 1 further episode where she was on the toilet, stood up and felt lightheaded and then fell hitting her head against the ground.  She is not sure how long she was unconscious for.  She denies any chest pain, palpitations, shortness of breath during these episodes.  She denies any abdominal pain, nausea, vomiting, black stools, bloody stool.  Has had some similar episodes in the past but not while lying in bed.   Past Medical History Past Medical History:  Diagnosis Date   Anxiety and depression    Chronic back pain    Diabetes mellitus without complication (HCC)    DJD (degenerative joint disease)    Frequent PVCs    GERD (gastroesophageal reflux disease)    Hyperlipidemia    Hypertension    Memory difficulties    Syncope    Patient Active Problem List   Diagnosis Date Noted   Syncope 08/12/2022   Pleural effusion 11/13/2021   S/P lobectomy of lung 09/28/2021   Elevated coronary artery calcium score 08/22/2021   Malignant neoplasm of lower lobe of left lung ([place]) 08/02/2021   Essential hypertension 05/23/2021   Hyperlipidemia 05/23/2021   Orthostatic hypotension 05/23/2021   Syncope and collapse 05/23/2021   Family history of heart disease 05/23/2021   Family history  of colon cancer 04/25/2016   Heme + stool 04/25/2016   Home Medication(s) Prior to Admission medications   Medication Sig Start Date End Date Taking? Authorizing Provider  atorvastatin (LIPITOR) 80 MG tablet Take 1 tablet (80 mg total) by mouth daily. 08/22/21  Yes [name], MD  benazepril (LOTENSIN) 10 MG tablet Take 5 mg by mouth every evening.   Yes [provider]  citalopram (CELEXA) 20 MG tablet Take 40 mg by mouth every evening. 03/05/14  Yes [provider]  cycloSPORINE (RESTASIS) 0.05 % ophthalmic emulsion Place 1 drop into both eyes 2 (two) times daily.    Yes [provider]  ergocalciferol (VITAMIN D2) 1.25 MG (50000 UT) capsule Take 50,000 Units by mouth every Sunday.   Yes [provider]  ezetimibe (ZETIA) 10 MG tablet Take 1 tablet (10 mg total) by mouth daily. 12/15/21 10/11/22 Yes Cleaver, Jossie Ng, NP  famotidine (PEPCID) 20 MG tablet Take 20 mg by mouth at bedtime.   Yes [provider]  gabapentin (NEURONTIN) 300 MG capsule Take 300 mg by mouth daily as needed (for neuropathy).   Yes [provider]  hydrOXYzine (ATARAX) 25 MG tablet Take 25 mg by mouth at bedtime. 09/26/21  Yes [provider]  insulin detemir (LEVEMIR) 100 UNIT/ML injection Inject 10-24 Units into the skin See admin instructions. Inject 24 units subcutaneously in the morning & inject 10 units subcutaneously at night.   Yes [provider]  memantine [facility])  5 MG tablet Take 10 mg by mouth daily.   Yes [provider]  Zinc Oxide 10 % OINT Apply 1 Application topically daily as needed (for hands).   Yes [provider]  Blood Glucose Monitoring Suppl (ONE TOUCH ULTRA 2) w/Device KIT SMARTSIG:1 Each Via Meter As Directed 10/24/21   [provider]  Blood Glucose Monitoring Suppl (ONE TOUCH ULTRA 2) w/Device KIT USE TO TEST YOUR BLOOD SUGAR FINGER STICK ONCE A DAY    [provider]  Lancets (ONETOUCH  DELICA PLUS [id number]) MISC Apply 1 each topically daily. 10/16/21   [provider]  Donald Siva test strip SMARTSIG:Via Meter 10/01/21   [provider]                                                                                                                                    Past Surgical History Past Surgical History:  Procedure Laterality Date   ABDOMINAL HYSTERECTOMY     BRONCHIAL BIOPSY  08/29/2021   Procedure: BRONCHIAL BIOPSIES;  Surgeon: Garner Nash, DO;  Location: [facility];  Service: Pulmonary;;   BRONCHIAL BRUSHINGS  08/29/2021   Procedure: BRONCHIAL BRUSHINGS;  Surgeon: Garner Nash, DO;  Location: [facility];  Service: Pulmonary;;   BRONCHIAL NEEDLE ASPIRATION BIOPSY  08/29/2021   Procedure: BRONCHIAL NEEDLE ASPIRATION BIOPSIES;  Surgeon: Garner Nash, DO;  Location: [facility];  Service: Pulmonary;;   CARDIAC CATHETERIZATION     2014   DILATION AND CURETTAGE OF UTERUS     EYE SURGERY Left    X 2   INTERCOSTAL NERVE BLOCK Left 09/28/2021   Procedure: INTERCOSTAL NERVE BLOCK;  Surgeon: Lajuana Matte, MD;  Location: [facility];  Service: Thoracic;  Laterality: Left;   LYMPH NODE DISSECTION Left 09/28/2021   Procedure: LYMPH NODE DISSECTION;  Surgeon: Lajuana Matte, MD;  Location: [place];  Service: Thoracic;  Laterality: Left;   THORACENTESIS Left 11/17/2021   Procedure: Mathews Robinsons;  Surgeon: Garner Nash, DO;  Location: [facility];  Service: Pulmonary;  Laterality: Left;   VIDEO BRONCHOSCOPY WITH RADIAL ENDOBRONCHIAL ULTRASOUND  08/29/2021   Procedure: RADIAL ENDOBRONCHIAL ULTRASOUND;  Surgeon: Garner Nash, DO;  Location: [facility];  Service: Pulmonary;;   Family History Family History  Problem Relation Age of Onset   CAD Mother    CAD Father    Heart attack Father    Colon cancer Brother 76    Social History Social History   Tobacco Use   Smoking status: Former    Packs/day: 0.50    Years: 7.00    Total  pack years: 3.50    Types: Cigarettes    Quit date: 02/23/2002    Years since quitting: 20.4   Smokeless tobacco: Never  Vaping Use   Vaping Use: Never used  Substance Use Topics   Alcohol use: Yes    Alcohol/week: 14.0 standard drinks of alcohol  Types: 14 Glasses of wine per week    Comment: wine occassionally   Drug use: No   Allergies Sulfa antibiotics, Pravastatin, and Statins  Review of Systems Review of Systems  All other systems reviewed and are negative.   Physical Exam Vital Signs  I have reviewed the triage vital signs BP (!) 107/33 (BP Location: Left Arm)   Pulse (!) 51   Temp 98 F (36.7 C) (Oral)   Resp 16   SpO2 98%  Physical Exam Vitals and nursing note reviewed.  Constitutional:      General: She is not in acute distress.    Appearance: She is well-developed.  HENT:     Head: Normocephalic.     Comments: Swelling and bruising to the nose, abrasion to the forehead no nasal septal hematoma    Mouth/Throat:     Mouth: Mucous membranes are moist.  Eyes:     Pupils: Pupils are equal, round, and reactive to light.  Cardiovascular:     Rate and Rhythm: Normal rate and regular rhythm.     Heart sounds: No murmur heard. Pulmonary:     Effort: Pulmonary effort is normal. No respiratory distress.     Breath sounds: Normal breath sounds.  Abdominal:     General: Abdomen is flat.     Palpations: Abdomen is soft.     Tenderness: There is no abdominal tenderness.  Musculoskeletal:        General: No tenderness.     Cervical back: No tenderness.     Right lower leg: No edema.     Left lower leg: No edema.  Skin:    General: Skin is warm and dry.  Neurological:     General: No focal deficit present.     Mental Status: She is alert. Mental status is at baseline.  Psychiatric:        Mood and Affect: Mood normal.        Behavior: Behavior normal.     ED Results and Treatments Labs (all labs ordered are listed, but only abnormal results are  displayed) Labs Reviewed  BASIC METABOLIC PANEL - Abnormal; Notable for the following components:      Result Value   Glucose, Bld 137 (*)    Creatinine, Ser 1.01 (*)    GFR, Estimated 60 (*)    All other components within normal limits  CBG MONITORING, ED - Abnormal; Notable for the following components:   Glucose-Capillary 127 (*)    All other components within normal limits  CBC  TROPONIN I (HIGH SENSITIVITY)  TROPONIN I (HIGH SENSITIVITY)                                                                                                                          Radiology CT Head Wo Contrast  Result Date: 08/12/2022 CLINICAL DATA:  Fall from standing height EXAM: CT HEAD WITHOUT CONTRAST CT MAXILLOFACIAL WITHOUT CONTRAST CT CERVICAL SPINE WITHOUT CONTRAST TECHNIQUE: Multidetector CT imaging  of the head, cervical spine, and maxillofacial structures were performed using the standard protocol without intravenous contrast. Multiplanar CT image reconstructions of the cervical spine and maxillofacial structures were also generated. RADIATION DOSE REDUCTION: This exam was performed according to the departmental dose-optimization program which includes automated exposure control, adjustment of the mA and/or kV according to patient size and/or use of iterative reconstruction technique. COMPARISON:  None Available. FINDINGS: CT HEAD FINDINGS Brain: No evidence of acute infarction, hemorrhage, hydrocephalus, extra-axial collection or mass lesion/mass effect. Vascular: No hyperdense vessel or unexpected calcification. CT FACIAL BONES FINDINGS Skull: Normal. Negative for fracture or focal lesion. Facial bones: No displaced fractures or dislocations. Sinuses/Orbits: No acute finding. Other: Soft tissue laceration of the forehead and bridge of the nose. CT CERVICAL SPINE FINDINGS Alignment: Normal. Skull base and vertebrae: No acute fracture. No primary bone lesion or focal pathologic process. Soft tissues and  spinal canal: No prevertebral fluid or swelling. No visible canal hematoma. Disc levels: Moderate multilevel cervical disc space height loss and osteophytosis. Upper chest: Negative. Other: None. IMPRESSION: 1. No acute intracranial pathology. 2. No displaced fractures or dislocations of the facial bones. 3. Soft tissue laceration of the forehead and bridge of the nose. 4. No fracture or static subluxation of the cervical spine. 5. Moderate multilevel cervical disc degenerative disease. Electronically Signed   By: Delanna Ahmadi M.D.   On: 08/12/2022 20:47   CT Cervical Spine Wo Contrast  Result Date: 08/12/2022 CLINICAL DATA:  Fall from standing height EXAM: CT HEAD WITHOUT CONTRAST CT MAXILLOFACIAL WITHOUT CONTRAST CT CERVICAL SPINE WITHOUT CONTRAST TECHNIQUE: Multidetector CT imaging of the head, cervical spine, and maxillofacial structures were performed using the standard protocol without intravenous contrast. Multiplanar CT image reconstructions of the cervical spine and maxillofacial structures were also generated. RADIATION DOSE REDUCTION: This exam was performed according to the departmental dose-optimization program which includes automated exposure control, adjustment of the mA and/or kV according to patient size and/or use of iterative reconstruction technique. COMPARISON:  None Available. FINDINGS: CT HEAD FINDINGS Brain: No evidence of acute infarction, hemorrhage, hydrocephalus, extra-axial collection or mass lesion/mass effect. Vascular: No hyperdense vessel or unexpected calcification. CT FACIAL BONES FINDINGS Skull: Normal. Negative for fracture or focal lesion. Facial bones: No displaced fractures or dislocations. Sinuses/Orbits: No acute finding. Other: Soft tissue laceration of the forehead and bridge of the nose. CT CERVICAL SPINE FINDINGS Alignment: Normal. Skull base and vertebrae: No acute fracture. No primary bone lesion or focal pathologic process. Soft tissues and spinal canal: No  prevertebral fluid or swelling. No visible canal hematoma. Disc levels: Moderate multilevel cervical disc space height loss and osteophytosis. Upper chest: Negative. Other: None. IMPRESSION: 1. No acute intracranial pathology. 2. No displaced fractures or dislocations of the facial bones. 3. Soft tissue laceration of the forehead and bridge of the nose. 4. No fracture or static subluxation of the cervical spine. 5. Moderate multilevel cervical disc degenerative disease. Electronically Signed   By: Delanna Ahmadi M.D.   On: 08/12/2022 20:47   CT Maxillofacial Wo Contrast  Result Date: 08/12/2022 CLINICAL DATA:  Fall from standing height EXAM: CT HEAD WITHOUT CONTRAST CT MAXILLOFACIAL WITHOUT CONTRAST CT CERVICAL SPINE WITHOUT CONTRAST TECHNIQUE: Multidetector CT imaging of the head, cervical spine, and maxillofacial structures were performed using the standard protocol without intravenous contrast. Multiplanar CT image reconstructions of the cervical spine and maxillofacial structures were also generated. RADIATION DOSE REDUCTION: This exam was performed according to the departmental dose-optimization program which includes automated exposure control,  adjustment of the mA and/or kV according to patient size and/or use of iterative reconstruction technique. COMPARISON:  None Available. FINDINGS: CT HEAD FINDINGS Brain: No evidence of acute infarction, hemorrhage, hydrocephalus, extra-axial collection or mass lesion/mass effect. Vascular: No hyperdense vessel or unexpected calcification. CT FACIAL BONES FINDINGS Skull: Normal. Negative for fracture or focal lesion. Facial bones: No displaced fractures or dislocations. Sinuses/Orbits: No acute finding. Other: Soft tissue laceration of the forehead and bridge of the nose. CT CERVICAL SPINE FINDINGS Alignment: Normal. Skull base and vertebrae: No acute fracture. No primary bone lesion or focal pathologic process. Soft tissues and spinal canal: No prevertebral fluid or  swelling. No visible canal hematoma. Disc levels: Moderate multilevel cervical disc space height loss and osteophytosis. Upper chest: Negative. Other: None. IMPRESSION: 1. No acute intracranial pathology. 2. No displaced fractures or dislocations of the facial bones. 3. Soft tissue laceration of the forehead and bridge of the nose. 4. No fracture or static subluxation of the cervical spine. 5. Moderate multilevel cervical disc degenerative disease. Electronically Signed   By: Delanna Ahmadi M.D.   On: 08/12/2022 20:47   DG Chest Port 1 View  Result Date: 08/12/2022 CLINICAL DATA:  Recent syncopal episode EXAM: PORTABLE CHEST 1 VIEW COMPARISON:  11/17/2021 FINDINGS: Cardiac shadow is within normal limits. Aortic calcifications are noted. Lungs are clear. Chronic blunting of left costophrenic angle is again noted. No bony abnormality is seen. IMPRESSION: No acute abnormality noted. Chronic blunting of left costophrenic angle is noted. Electronically Signed   By: Inez Catalina M.D.   On: 08/12/2022 18:56    Pertinent labs & imaging results that were available during my care of the patient were reviewed by me and considered in my medical decision making (see MDM for details).  Medications Ordered in ED Medications  Tdap (BOOSTRIX) injection 0.5 mL (has no administration in time range)  bisoprolol (ZEBETA) tablet 5 mg (has no administration in time range)  sodium chloride 0.9 % bolus 1,000 mL (1,000 mLs Intravenous New Bag/Given 08/12/22 2036)                                                                                                                                     Procedures Procedures  (including critical care time)  Medical Decision Making / ED Course   MDM:  73 year old female presenting to the emergency department with syncope.  Patient well-appearing, vital signs with some bradycardia, EKG shows normal sinus rhythm with frequent PVCs, no arrhythmia.  She does have some facial trauma  on exam.  Unclear cause of syncope, has previously had episodes of syncope, monitor showed PVCs, NSVT and SVT.  Seems as if this is worsening and now she is having episodes at rest.  Episode today concerning given that she fell and develop facial trauma.  Will obtain CT scans to further evaluate.  Will check troponin although no chest pain or shortness of breath.  No hypoxia, shortness of breath, chest pain, recent travel or surgeries to suggest pulmonary embolism.  Given that she is having episodes at rest lower concern for orthostatic or vasovagal causes of syncope.  Given worsening episodes will likely need admission for telemetry and possibly repeat echocardiogram.  Clinical Course as of 08/12/22 2259  Sun Aug 12, 2022  2156 Discussed with hospitalist Dr. Bridgett Larsson who will admit. Discussed with Dr. Patsey Berthold with cardiology who agrees to consult.  [WS]    Clinical Course User Index [WS] Cristie Hem, MD     Additional history obtained: -Additional history obtained from family and ems -External records from outside source obtained and reviewed including: Chart review including previous notes, labs, imaging, consultation notes including cardiiology note last June for similar symptoms   Lab Tests: -I ordered, reviewed, and interpreted labs.   The pertinent results include:   Labs Reviewed  BASIC METABOLIC PANEL - Abnormal; Notable for the following components:      Result Value   Glucose, Bld 137 (*)    Creatinine, Ser 1.01 (*)    GFR, Estimated 60 (*)    All other components within normal limits  CBG MONITORING, ED - Abnormal; Notable for the following components:   Glucose-Capillary 127 (*)    All other components within normal limits  CBC  TROPONIN I (HIGH SENSITIVITY)  TROPONIN I (HIGH SENSITIVITY)    Notable for normal troponin, no leukocytosis or renal failure  EKG   EKG Interpretation  Date/Time:  Sunday August 12 2022 18:41:41 EST Ventricular Rate:  73 PR  Interval:  131 QRS Duration: 92 QT Interval:  392 QTC Calculation: 432 R Axis:   118 Text Interpretation: Sinus rhythm Multiple ventricular premature complexes Right axis deviation Borderline low voltage, extremity leads Minimal ST depression, lateral leads Confirmed by Garnette Gunner [phone]) on 08/12/2022 7:44:50 PM         Imaging Studies ordered: I ordered imaging studies including CXR, CT face, CT head and neck On my interpretation imaging demonstrates no acute process, no facial fracture I independently visualized and interpreted imaging. I agree with the radiologist interpretation   Medicines ordered and prescription drug management: Meds ordered this encounter  Medications   sodium chloride 0.9 % bolus 1,000 mL   Tdap (BOOSTRIX) injection 0.5 mL   bisoprolol (ZEBETA) tablet 5 mg    -I have reviewed the patients home medicines and have made adjustments as needed   Consultations Obtained: I requested consultation with the cardiologist,  and discussed lab and imaging findings as well as pertinent plan - they recommend: they will consult   Cardiac Monitoring: The patient was maintained on a cardiac monitor.  I personally viewed and interpreted the cardiac monitored which showed an underlying rhythm of: NSR, frequent PVC  Social Determinants of Health:  Diagnosis or treatment significantly limited by social determinants of health: former smoker   Reevaluation: After the interventions noted above, I reevaluated the patient and found that they have improved  Co morbidities that complicate the patient evaluation  Past Medical History:  Diagnosis Date   Anxiety and depression    Chronic back pain    Diabetes mellitus without complication (HCC)    DJD (degenerative joint disease)    Frequent PVCs    GERD (gastroesophageal reflux disease)    Hyperlipidemia    Hypertension    Memory difficulties    Syncope       Dispostion: Disposition decision including need  for hospitalization was considered, and patient admitted  to the hospital.    Final Clinical Impression(s) / ED Diagnoses Final diagnoses:  Syncope and collapse  Contusion of face, initial encounter     This chart was dictated using voice recognition software.  Despite best efforts to proofread,  errors can occur which can change the documentation meaning.    Cristie Hem, MD 08/12/22 2259

## 2022-08-12 NOTE — Assessment & Plan Note (Signed)
Continue zetia and lipitor.

## 2022-08-12 NOTE — Assessment & Plan Note (Signed)
Stop losartan. Start bisoprolol 5 mg daily.

## 2022-08-12 NOTE — H&P (Signed)
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  Patient states that she was getting up from using the bathroom and she felt dizzy and passed out.  She fell forward and struck her head onto the carpet.  Daughter states that yesterday patient had 2 episodes of spontaneous syncope while she was sitting in bed talking to her son-in-law.  This episode lasted about 10 or 15 seconds where she has sudden onset of loss of consciousness.  She was not apneic.  She woke up and had no recollection of that time.  Patient denies any palpitations.  Her history of syncope has been evaluated the past by a 30-day monitor.  This was performed in January 2023.  This showed that she had frequent PVCs and short runs of nonsustained ventricular tachycardia.  She also had runs of SVT.  At that time she was referred to EP cardiology.  However a lung nodule was discovered that time and she not having diagnosis of lung cancer.  This issue was lost to follow-up.  Lab workup showed a sodium 138, potassium 3.8, bicarb 29, BUN 11, creatinine 1.0, glucose 137  CBC was unremarkable.  EKG on my interpretation demonstrated normal sinus rhythm with frequent PVCs.  [facility] contacted for admission.   ED Course: workup negative.CT head/neck negative for fractures  Review of Systems:  Review of Systems  Constitutional: Negative.   HENT: Negative.    Eyes: Negative.   Respiratory: Negative.    Cardiovascular:        Syncope, no prodrome or aura  Gastrointestinal: Negative.    Genitourinary: Negative.   Musculoskeletal:  Positive for joint pain.       Chronic knee pain, back pain  Skin: Negative.   Neurological:  Positive for loss of consciousness.  Endo/Heme/Allergies: Negative.   Psychiatric/Behavioral: Negative.    All other systems reviewed and are negative.   Past Medical History:  Diagnosis Date   Anxiety and depression    Chronic back pain    Diabetes mellitus without complication (HCC)    DJD (degenerative joint disease)    Frequent PVCs    GERD (gastroesophageal reflux disease)    Hyperlipidemia    Hypertension    Memory difficulties    Syncope     Past Surgical History:  Procedure Laterality Date   ABDOMINAL HYSTERECTOMY     BRONCHIAL BIOPSY  08/29/2021   Procedure: BRONCHIAL BIOPSIES;  Surgeon: Garner Nash, DO;  Location: [facility];  Service: Pulmonary;;   BRONCHIAL BRUSHINGS  08/29/2021   Procedure: BRONCHIAL BRUSHINGS;  Surgeon: Garner Nash, DO;  Location: [facility];  Service: Pulmonary;;   BRONCHIAL NEEDLE ASPIRATION BIOPSY  08/29/2021   Procedure: BRONCHIAL NEEDLE ASPIRATION BIOPSIES;  Surgeon: Garner Nash, DO;  Location: [facility];  Service: Pulmonary;;   CARDIAC CATHETERIZATION     2014   DILATION AND CURETTAGE OF UTERUS     EYE SURGERY Left    X 2   INTERCOSTAL NERVE BLOCK Left 09/28/2021   Procedure: INTERCOSTAL NERVE BLOCK;  Surgeon: Lajuana Matte, MD;  Location: [facility];  Service: Thoracic;  Laterality: Left;   LYMPH NODE DISSECTION Left 09/28/2021   Procedure: LYMPH NODE DISSECTION;  Surgeon: Lajuana Matte, MD;  Location: [facility];  Service: Thoracic;  Laterality: Left;   THORACENTESIS Left 11/17/2021   Procedure: THORACENTESIS;  Surgeon: Garner Nash, DO;  Location: [facility];  Service: Pulmonary;  Laterality: Left;   VIDEO BRONCHOSCOPY WITH RADIAL ENDOBRONCHIAL ULTRASOUND  08/29/2021   Procedure: RADIAL ENDOBRONCHIAL ULTRASOUND;  Surgeon: Garner Nash, DO;  Location: [facility];   Service: Pulmonary;;     reports that she quit smoking about 20 years ago. Her smoking use included cigarettes. She has a 3.50 pack-year smoking history. She has never used smokeless tobacco. She reports current alcohol use of about 14.0 standard drinks of alcohol per week. She reports that she does not use drugs.  Allergies  Allergen Reactions   Sulfa Antibiotics Anaphylaxis and Shortness Of Breath   Pravastatin Other (See Comments)    dizziness   Statins Other (See Comments)    dizziness    Family History  Problem Relation Age of Onset   CAD Mother    CAD Father    Heart attack Father    Colon cancer Brother 18    Prior to Admission medications   Medication Sig Start Date End Date Taking? Authorizing Provider  atorvastatin (LIPITOR) 80 MG tablet Take 1 tablet (80 mg total) by mouth daily. 08/22/21  Yes [name], MD  benazepril (LOTENSIN) 10 MG tablet Take 5 mg by mouth every evening.   Yes [provider]  citalopram (CELEXA) 20 MG tablet Take 40 mg by mouth every evening. 03/05/14  Yes [provider]  cycloSPORINE (RESTASIS) 0.05 % ophthalmic emulsion Place 1 drop into both eyes 2 (two) times daily.    Yes [provider]  ergocalciferol (VITAMIN D2) 1.25 MG (50000 UT) capsule Take 50,000 Units by mouth every Sunday.   Yes [provider]  ezetimibe (ZETIA) 10 MG tablet Take 1 tablet (10 mg total) by mouth daily. 12/15/21 10/11/22 Yes Cleaver, Jossie Ng, NP  famotidine (PEPCID) 20 MG tablet Take 20 mg by mouth at bedtime.   Yes [provider]  gabapentin (NEURONTIN) 300 MG capsule Take 300 mg by mouth daily as needed (for neuropathy).   Yes [provider]  hydrOXYzine (ATARAX) 25 MG tablet Take 25 mg by mouth at bedtime. 09/26/21  Yes [provider]  insulin detemir (LEVEMIR) 100 UNIT/ML injection Inject 10-24 Units into the skin See admin instructions. Inject 24 units subcutaneously in the morning & inject 10  units subcutaneously at night.   Yes [provider]  memantine (NAMENDA) 5 MG tablet Take 10 mg by mouth daily.   Yes [provider]  Zinc Oxide 10 % OINT Apply 1 Application topically daily as needed (for hands).   Yes [provider]  Blood Glucose Monitoring Suppl (ONE TOUCH ULTRA 2) w/Device KIT SMARTSIG:1 Each Via Meter As Directed 10/24/21   [provider]  Blood Glucose Monitoring Suppl (ONE TOUCH ULTRA 2) w/Device KIT USE TO TEST YOUR BLOOD SUGAR FINGER STICK ONCE A DAY    [provider]  Lancets (ONETOUCH DELICA PLUS [id number]) MISC Apply 1 each topically daily. 10/16/21   [provider]  Donald Siva test strip SMARTSIG:Via Meter 10/01/21   [provider]    Physical Exam: Vitals:   08/12/22 1804  BP: (!) 107/33  Pulse: (!) 51  Resp: 16  Temp:  98 F (36.7 C)  TempSrc: Oral  SpO2: 98%    Physical Exam Vitals and nursing note reviewed.  Constitutional:      General: She is not in acute distress.    Appearance: Normal appearance. She is not ill-appearing, toxic-appearing or diaphoretic.  HENT:     Head: Normocephalic.      Comments: Bruising over bridge of nose and right periorbital area    Nose: Nose normal.  Cardiovascular:     Rate and Rhythm: Regular rhythm. Tachycardia present.     Pulses: Normal pulses.  Pulmonary:     Effort: Pulmonary effort is normal.     Breath sounds: Normal breath sounds.  Abdominal:     General: Bowel sounds are normal. There is no distension.     Palpations: Abdomen is soft.     Tenderness: There is no abdominal tenderness.  Skin:    General: Skin is warm and dry.     Capillary Refill: Capillary refill takes less than 2 seconds.     Findings: Bruising present.  Neurological:     General: No focal deficit present.     Mental Status: She is alert. She is disoriented.      Labs on Admission: I have personally reviewed following labs and imaging  studies  CBC: Recent Labs  Lab 08/12/22 1814  WBC 4.7  HGB 14.1  HCT 42.9  MCV 92.7  PLT 366   Basic Metabolic Panel: Recent Labs  Lab 08/12/22 1814  NA 138  K 3.8  CL 100  CO2 29  GLUCOSE 137*  BUN 11  CREATININE 1.01*  CALCIUM 9.0   GFR: CrCl cannot be calculated (Unknown ideal weight.). Liver Function Tests: No results for input(s): "AST", "ALT", "ALKPHOS", "BILITOT", "PROT", "ALBUMIN" in the last 168 hours. No results for input(s): "LIPASE", "AMYLASE" in the last 168 hours. No results for input(s): "AMMONIA" in the last 168 hours. Coagulation Profile: No results for input(s): "INR", "PROTIME" in the last 168 hours. Cardiac Enzymes: Recent Labs  Lab 08/12/22 2010  TROPONINIHS 4   BNP (last 3 results) No results for input(s): "PROBNP" in the last 8760 hours. HbA1C: No results for input(s): "HGBA1C" in the last 72 hours. CBG: Recent Labs  Lab 08/12/22 1839  GLUCAP 127*   Lipid Profile: No results for input(s): "CHOL", "HDL", "LDLCALC", "TRIG", "CHOLHDL", "LDLDIRECT" in the last 72 hours. Thyroid Function Tests: No results for input(s): "TSH", "T4TOTAL", "FREET4", "T3FREE", "THYROIDAB" in the last 72 hours. Anemia Panel: No results for input(s): "VITAMINB12", "FOLATE", "FERRITIN", "TIBC", "IRON", "RETICCTPCT" in the last 72 hours. Urine analysis:    Component Value Date/Time   COLORURINE AMBER (A) 09/27/2021 1432   APPEARANCEUR CLOUDY (A) 09/27/2021 1432   LABSPEC 1.025 09/27/2021 1432   PHURINE 7.0 09/27/2021 1432   GLUCOSEU NEGATIVE 09/27/2021 1432   HGBUR NEGATIVE 09/27/2021 1432   BILIRUBINUR NEGATIVE 09/27/2021 1432   KETONESUR NEGATIVE 09/27/2021 1432   PROTEINUR 30 (A) 09/27/2021 1432   NITRITE NEGATIVE 09/27/2021 1432   LEUKOCYTESUR LARGE (A) 09/27/2021 1432    Radiological Exams on Admission: I have personally reviewed images CT Head Wo Contrast  Result Date: 08/12/2022 CLINICAL DATA:  Fall from standing height EXAM: CT HEAD WITHOUT  CONTRAST CT MAXILLOFACIAL WITHOUT CONTRAST CT CERVICAL SPINE WITHOUT CONTRAST TECHNIQUE: Multidetector CT imaging of the head, cervical spine, and maxillofacial structures were performed using the standard protocol without intravenous contrast. Multiplanar CT image reconstructions of the cervical spine and maxillofacial structures were also generated. RADIATION DOSE REDUCTION:  This exam was performed according to the departmental dose-optimization program which includes automated exposure control, adjustment of the mA and/or kV according to patient size and/or use of iterative reconstruction technique. COMPARISON:  None Available. FINDINGS: CT HEAD FINDINGS Brain: No evidence of acute infarction, hemorrhage, hydrocephalus, extra-axial collection or mass lesion/mass effect. Vascular: No hyperdense vessel or unexpected calcification. CT FACIAL BONES FINDINGS Skull: Normal. Negative for fracture or focal lesion. Facial bones: No displaced fractures or dislocations. Sinuses/Orbits: No acute finding. Other: Soft tissue laceration of the forehead and bridge of the nose. CT CERVICAL SPINE FINDINGS Alignment: Normal. Skull base and vertebrae: No acute fracture. No primary bone lesion or focal pathologic process. Soft tissues and spinal canal: No prevertebral fluid or swelling. No visible canal hematoma. Disc levels: Moderate multilevel cervical disc space height loss and osteophytosis. Upper chest: Negative. Other: None. IMPRESSION: 1. No acute intracranial pathology. 2. No displaced fractures or dislocations of the facial bones. 3. Soft tissue laceration of the forehead and bridge of the nose. 4. No fracture or static subluxation of the cervical spine. 5. Moderate multilevel cervical disc degenerative disease. Electronically Signed   By: Delanna Ahmadi M.D.   On: 08/12/2022 20:47   CT Cervical Spine Wo Contrast  Result Date: 08/12/2022 CLINICAL DATA:  Fall from standing height EXAM: CT HEAD WITHOUT CONTRAST CT  MAXILLOFACIAL WITHOUT CONTRAST CT CERVICAL SPINE WITHOUT CONTRAST TECHNIQUE: Multidetector CT imaging of the head, cervical spine, and maxillofacial structures were performed using the standard protocol without intravenous contrast. Multiplanar CT image reconstructions of the cervical spine and maxillofacial structures were also generated. RADIATION DOSE REDUCTION: This exam was performed according to the departmental dose-optimization program which includes automated exposure control, adjustment of the mA and/or kV according to patient size and/or use of iterative reconstruction technique. COMPARISON:  None Available. FINDINGS: CT HEAD FINDINGS Brain: No evidence of acute infarction, hemorrhage, hydrocephalus, extra-axial collection or mass lesion/mass effect. Vascular: No hyperdense vessel or unexpected calcification. CT FACIAL BONES FINDINGS Skull: Normal. Negative for fracture or focal lesion. Facial bones: No displaced fractures or dislocations. Sinuses/Orbits: No acute finding. Other: Soft tissue laceration of the forehead and bridge of the nose. CT CERVICAL SPINE FINDINGS Alignment: Normal. Skull base and vertebrae: No acute fracture. No primary bone lesion or focal pathologic process. Soft tissues and spinal canal: No prevertebral fluid or swelling. No visible canal hematoma. Disc levels: Moderate multilevel cervical disc space height loss and osteophytosis. Upper chest: Negative. Other: None. IMPRESSION: 1. No acute intracranial pathology. 2. No displaced fractures or dislocations of the facial bones. 3. Soft tissue laceration of the forehead and bridge of the nose. 4. No fracture or static subluxation of the cervical spine. 5. Moderate multilevel cervical disc degenerative disease. Electronically Signed   By: Delanna Ahmadi M.D.   On: 08/12/2022 20:47   CT Maxillofacial Wo Contrast  Result Date: 08/12/2022 CLINICAL DATA:  Fall from standing height EXAM: CT HEAD WITHOUT CONTRAST CT MAXILLOFACIAL WITHOUT  CONTRAST CT CERVICAL SPINE WITHOUT CONTRAST TECHNIQUE: Multidetector CT imaging of the head, cervical spine, and maxillofacial structures were performed using the standard protocol without intravenous contrast. Multiplanar CT image reconstructions of the cervical spine and maxillofacial structures were also generated. RADIATION DOSE REDUCTION: This exam was performed according to the departmental dose-optimization program which includes automated exposure control, adjustment of the mA and/or kV according to patient size and/or use of iterative reconstruction technique. COMPARISON:  None Available. FINDINGS: CT HEAD FINDINGS Brain: No evidence of acute infarction, hemorrhage, hydrocephalus, extra-axial collection  or mass lesion/mass effect. Vascular: No hyperdense vessel or unexpected calcification. CT FACIAL BONES FINDINGS Skull: Normal. Negative for fracture or focal lesion. Facial bones: No displaced fractures or dislocations. Sinuses/Orbits: No acute finding. Other: Soft tissue laceration of the forehead and bridge of the nose. CT CERVICAL SPINE FINDINGS Alignment: Normal. Skull base and vertebrae: No acute fracture. No primary bone lesion or focal pathologic process. Soft tissues and spinal canal: No prevertebral fluid or swelling. No visible canal hematoma. Disc levels: Moderate multilevel cervical disc space height loss and osteophytosis. Upper chest: Negative. Other: None. IMPRESSION: 1. No acute intracranial pathology. 2. No displaced fractures or dislocations of the facial bones. 3. Soft tissue laceration of the forehead and bridge of the nose. 4. No fracture or static subluxation of the cervical spine. 5. Moderate multilevel cervical disc degenerative disease. Electronically Signed   By: Delanna Ahmadi M.D.   On: 08/12/2022 20:47   DG Chest Port 1 View  Result Date: 08/12/2022 CLINICAL DATA:  Recent syncopal episode EXAM: PORTABLE CHEST 1 VIEW COMPARISON:  11/17/2021 FINDINGS: Cardiac shadow is within  normal limits. Aortic calcifications are noted. Lungs are clear. Chronic blunting of left costophrenic angle is again noted. No bony abnormality is seen. IMPRESSION: No acute abnormality noted. Chronic blunting of left costophrenic angle is noted. Electronically Signed   By: Inez Catalina M.D.   On: 08/12/2022 18:56    EKG: My personal interpretation of EKG shows: NSR, PVCs    Assessment/Plan Principal Problem:   Syncope Active Problems:   Essential hypertension   Hyperlipidemia   S/P lobectomy of lung   Type 2 diabetes mellitus with complication, with long-term current use of insulin (HCC)    Assessment and Plan: * Syncope Observation telemetry bed. Pt with 30 monitor in 2023 that showed NSVT, SVT. Pt was suppose to be referred to EP/Cards at that time, but her diagnosis of lung cancer took precedence and this issue was lost to followup.  Pt with resting HR in the low 100s. Will stop her losartan and start Bisoprolol 5 mg daily. Hopefully we can catch and arrhythmia while she is on telemetry and she has a syncopal event.  Pt will need f/u with EP/Cards as a outpatient. Will give her a tetanus booster.  Echo in 08-2021 showed normal LVEF 65%  Type 2 diabetes mellitus with complication, with long-term current use of insulin (HCC) Add SSI, check A1c.  S/P lobectomy of lung Pt has achieved surgical curse. Cancer was neuroendocrine. Pt has had f/u with oncology who is not planning any chemo or XRT.  Hyperlipidemia Continue zetia and lipitor.  Essential hypertension Stop losartan. Start bisoprolol 5 mg daily.   DVT prophylaxis: SQ Heparin Code Status: Full Code Family Communication: discussed with pt and dtr dawn at bedside Disposition Plan: return home  Consults called: EDP has consulted cardiology  Admission status: Observation, Telemetry bed   Kristopher Oppenheim, DO [facility] 08/12/2022, 11:12 PM

## 2022-08-12 NOTE — Assessment & Plan Note (Signed)
Pt has achieved surgical curse. Cancer was neuroendocrine. Pt has had f/u with oncology who is not planning any chemo or XRT.

## 2022-08-12 NOTE — Assessment & Plan Note (Addendum)
Observation telemetry bed. Pt with 30 monitor in 2023 that showed NSVT, SVT. Pt was suppose to be referred to EP/Cards at that time, but her diagnosis of lung cancer took precedence and this issue was lost to followup.  Pt with resting HR in the low 100s. Will stop her losartan and start Bisoprolol 5 mg daily. Hopefully we can catch and arrhythmia while she is on telemetry and she has a syncopal event.  Pt will need f/u with EP/Cards as a outpatient. Will give her a tetanus booster.  Echo in 08-2021 showed normal LVEF 65%

## 2022-08-12 NOTE — Subjective & Objective (Signed)
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  Patient states that she was getting up from using the bathroom and she felt dizzy and passed out.  She fell forward and struck her head onto the carpet.  Daughter states that yesterday patient had 2 episodes of spontaneous syncope while she was sitting in bed talking to her son-in-law.  This episode lasted about 10 or 15 seconds where she has sudden onset of loss of consciousness.  She was not apneic.  She woke up and had no recollection of that time.  Patient denies any palpitations.  Her history of syncope has been evaluated the past by a 30-day monitor.  This was performed in January 2023.  This showed that she had frequent PVCs and short runs of nonsustained ventricular tachycardia.  She also had runs of SVT.  At that time she was referred to EP cardiology.  However a lung nodule was discovered that time and she not having diagnosis of lung cancer.  This issue was lost to follow-up.  Lab workup showed a sodium 138, potassium 3.8, bicarb 29, BUN 11, creatinine 1.0, glucose 137  CBC was unremarkable.  EKG on my interpretation demonstrated normal sinus rhythm with frequent PVCs.  [facility] hospital[facility] contacted for admission.

## 2022-08-12 NOTE — Assessment & Plan Note (Signed)
Add SSI, check A1c.

## 2022-08-13 ENCOUNTER — Encounter (HOSPITAL_COMMUNITY): Payer: Self-pay | Admitting: Internal Medicine

## 2022-08-13 DIAGNOSIS — R55 Syncope and collapse: Secondary | ICD-10-CM | POA: Diagnosis not present

## 2022-08-13 LAB — CBG MONITORING, ED
Glucose-Capillary: 137 mg/dL — ABNORMAL HIGH (ref 70–99)
Glucose-Capillary: 136 mg/dL — ABNORMAL HIGH (ref 70–99)

## 2022-08-13 LAB — HEMOGLOBIN A1C
Hgb A1c MFr Bld: 6.4 % — ABNORMAL HIGH (ref 4.8–5.6)
Mean Plasma Glucose: 136.98 mg/dL

## 2022-08-13 LAB — GLUCOSE, CAPILLARY
Glucose-Capillary: 112 mg/dL — ABNORMAL HIGH (ref 70–99)
Glucose-Capillary: 142 mg/dL — ABNORMAL HIGH (ref 70–99)

## 2022-08-13 LAB — TROPONIN I (HIGH SENSITIVITY): Troponin I (High Sensitivity): 5 ng/L (ref <18)

## 2022-08-13 LAB — TSH: TSH: 1.835 u[IU]/mL (ref 0.350–4.500)

## 2022-08-13 MED ORDER — INSULIN ASPART 100 UNIT/ML IJ SOLN: 0.0000 [IU] | Freq: Every day | INTRAMUSCULAR | Status: DC

## 2022-08-13 MED ORDER — DICLOFENAC SODIUM 1 % EX GEL
4.0000 g | Freq: Two times a day (BID) | CUTANEOUS | Status: DC | PRN
  Administered 2022-08-13 – 2022-08-14 (×2): 4 g via TOPICAL
  Filled 2022-08-13: qty 100

## 2022-08-13 MED ORDER — INSULIN ASPART 100 UNIT/ML IJ SOLN
0.0000 [IU] | Freq: Three times a day (TID) | INTRAMUSCULAR | Status: DC
  Administered 2022-08-13 – 2022-08-14 (×4): 2 [IU] via SUBCUTANEOUS

## 2022-08-13 MED ORDER — ACETAMINOPHEN 650 MG RE SUPP: 650.0000 mg | Freq: Four times a day (QID) | RECTAL | Status: DC | PRN

## 2022-08-13 MED ORDER — MEMANTINE HCL 10 MG PO TABS
10.0000 mg | ORAL_TABLET | Freq: Every day | ORAL | Status: DC
  Administered 2022-08-13: 10 mg via ORAL
  Filled 2022-08-13: qty 1

## 2022-08-13 MED ORDER — ACETAMINOPHEN 325 MG PO TABS: 650.0000 mg | ORAL_TABLET | Freq: Four times a day (QID) | ORAL | Status: DC | PRN

## 2022-08-13 MED ORDER — CITALOPRAM HYDROBROMIDE 20 MG PO TABS
20.0000 mg | ORAL_TABLET | Freq: Every evening | ORAL | Status: DC
  Administered 2022-08-13 – 2022-08-14 (×2): 20 mg via ORAL
  Filled 2022-08-13 (×2): qty 1

## 2022-08-13 MED ORDER — HEPARIN SODIUM (PORCINE) 5000 UNIT/ML IJ SOLN
5000.0000 [IU] | Freq: Three times a day (TID) | INTRAMUSCULAR | Status: DC
  Administered 2022-08-13 – 2022-08-14 (×5): 5000 [IU] via SUBCUTANEOUS
  Filled 2022-08-13 (×5): qty 1

## 2022-08-13 MED ORDER — CITALOPRAM HYDROBROMIDE 20 MG PO TABS: 40.0000 mg | ORAL_TABLET | Freq: Every evening | ORAL | Status: DC

## 2022-08-13 MED ORDER — ATORVASTATIN CALCIUM 80 MG PO TABS
80.0000 mg | ORAL_TABLET | Freq: Every day | ORAL | Status: DC
  Administered 2022-08-13 – 2022-08-14 (×2): 80 mg via ORAL
  Filled 2022-08-13 (×2): qty 1

## 2022-08-13 MED ORDER — MELATONIN 5 MG PO TABS: 10.0000 mg | ORAL_TABLET | Freq: Every evening | ORAL | Status: DC | PRN

## 2022-08-13 MED ORDER — EZETIMIBE 10 MG PO TABS
10.0000 mg | ORAL_TABLET | Freq: Every day | ORAL | Status: DC
  Administered 2022-08-13 – 2022-08-14 (×2): 10 mg via ORAL
  Filled 2022-08-13 (×2): qty 1

## 2022-08-13 MED ORDER — ONDANSETRON HCL 4 MG PO TABS: 4.0000 mg | ORAL_TABLET | Freq: Four times a day (QID) | ORAL | Status: DC | PRN

## 2022-08-13 MED ORDER — ONDANSETRON HCL 4 MG/2ML IJ SOLN: 4.0000 mg | Freq: Four times a day (QID) | INTRAMUSCULAR | Status: DC | PRN

## 2022-08-13 MED ORDER — HYDROXYZINE HCL 25 MG PO TABS
25.0000 mg | ORAL_TABLET | Freq: Every day | ORAL | Status: DC
  Administered 2022-08-13: 25 mg via ORAL
  Filled 2022-08-13: qty 1

## 2022-08-13 NOTE — ED Notes (Signed)
Room 30 searched for medication bag but nothing found.  Pt is adamant she had it in the room when she arrived.

## 2022-08-13 NOTE — Progress Notes (Signed)
Mobility Specialist Progress Note:   08/13/22 1230  Mobility  Activity Stood at bedside  Level of Assistance Standby assist, set-up cues, supervision of patient - no hands on  Assistive Device None  Activity Response Tolerated well  $Mobility charge 1 Mobility   Pt in bed willing to participate in mobility. No complaints of pain. Pt stated after standing 2 minutes her legs were getting tired. Left EOB with call bell in reach and all needs met.   Gareth Eagle Labrittany Wechter Mobility Specialist Please contact via [employer] or  Rehab Office at [phone]

## 2022-08-13 NOTE — Progress Notes (Signed)
Mobility Specialist Progress Note:  Orthostatic BPs  HR  Supine 98/70 (79) 72  Sitting 98/59 (71) 74  Standing 105/64 (75) 78  Standing after 3 min 118/66 (78) 79    Alexandra Berger Mobility Specialist Please contact via [employer] or  Rehab Office at [phone]

## 2022-08-13 NOTE — ED Notes (Signed)
Pt asking about her "blue medication bag."  Pt made aware that this writer doesn't know anything about the bag, but will check her previous room.

## 2022-08-13 NOTE — Consult Note (Addendum)
Cardiology Consultation   Patient ID: Alexandra Berger MRN: [id number]; DOB: Feb 25, 1951  Admit date: 08/12/2022 Date of Consult: 08/13/2022  PCP:  Faustino Congress, NP   [facility] Cardiologist:  Quay Burow, MD   {   Patient Profile:   Alexandra Berger is a 72 y.o. 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  History of Present Illness:   [name] with above PMH  initially established care with Dr. [name] on 05/23/2021 for orthostatic hypotension and unwitnessed syncope.  She was taking benazepril for hypertension, asymptomatic from orthostasis, placed on 30-day ZIO monitor which revealed sinus rhythm, sinus bradycardia, sinus tachycardia, frequent PVCs with short runs of NSVT and runs of SVT.  On 06/12/2021, coronary calcium score was 158 with majority in LAD, 78 percentile, dilated main pulmonary artery to 35 mm suggest pulmonary hypertension.  She has not experienced any chest pain or angina equivalent symptoms.  Given low normal blood pressure, her benazepril was reduced to 10 mg daily on 08/22/2021 office visit.  She was maintained on high-dose statin for hyperlipidemia, was referred to lipid clinic for evaluation.  She was also referred to EP for frequent PVCs given history of syncope. Appears there is no follow up with EP or lipid clinic.  She was last seen by [name] on 12/15/2021, complained increased shortness of breath with exertional activity, Zetia 10 mg was added for hyperlipidemia.   Last Echo 09/11/21 showed LVEF 60-65%, no RWMA, normal diastolic parameters,normal RV, small pericardial effusion, trivial MR.    She was noted incidentally with 2.5 cm left lower  lobe pulmonary nodule cardiac CT scoring scan 06/12/2021, likely refer to pulmonology, PET scan on 07/06/2021 revealed 2.4 cm well circumscribed pulmonary nodule in the anterior left lower lobe with mild hypermetabolism suspicious for pulmonary neuroendocrine tumor versus low-grade carcinoma.  No evidence of metastatic disease.  She was seen by Dr. [name] and bronchoscopy on 08/29/2021 that showed neuroendocrine carcinoma.  The patient was referred to Dr. [name].  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  It measured 2.1 cm.  There was no evidence for visceral pleural or lymphovascular invasion.  The sampled lymph nodes were negative for malignancy. She was then referred to oncology Dr Julien Nordmann, she was informed that there is curable prognosis with surgical resection and CT chest in 1 year was recommended for surveillance.    Patient presented to ER 08/12/2022 complaining of syncope episode. She was in normal state of health until Sat 08/11/22.  She was getting up to use the bathroom, suddenly felt dizzy, felt the room was spinning,  that eventually led to her passing out.  She fell forward and hit her head onto the floor.  Her grandson found her on the ground. She remembers waking up feeling "foggy" and could not get her words out.  Her daughter had reported that patient was witnessed 2 episodes of syncope while sitting in bed talking, which she does not recall.  She states she has not passed out like this ever. She states she gets dizzy on and off throughout the years. She never had any chest pain, SOB, nausea, paresthesia, leg  edema, fever, chills cough, vomiting, diarrhea, low P O Intake.   Admission diagnostic revealed creatinine 1.01 and GFR 60, grossly unremarkable CBC and BMP.  Hemoglobin A1c 6.4%. Hs trop negative x2 (4>5). TSH WNL. CT head showed no acute ICH. CT C spine  showed no acute fracture. CT facial showed soft tissue laceration of the forehead and bridge of the nose, no acute fracture.  EKG showed Sinus rhythm, frequent PVCs, low voltage QRS. She was admitted to hospitalist, cardiology is consulted for syncope.     Past Medical History:  Diagnosis Date   Anxiety and depression    Chronic back pain    Diabetes mellitus without complication (HCC)    DJD (degenerative joint disease)    Frequent PVCs    GERD (gastroesophageal reflux disease)    Hyperlipidemia    Hypertension    Memory difficulties    Syncope     Past Surgical History:  Procedure Laterality Date   ABDOMINAL HYSTERECTOMY     BRONCHIAL BIOPSY  08/29/2021   Procedure: BRONCHIAL BIOPSIES;  Surgeon: Garner Nash, DO;  Location: [facility];  Service: Pulmonary;;   BRONCHIAL BRUSHINGS  08/29/2021   Procedure: BRONCHIAL BRUSHINGS;  Surgeon: Garner Nash, DO;  Location: [facility];  Service: Pulmonary;;   BRONCHIAL NEEDLE ASPIRATION BIOPSY  08/29/2021   Procedure: BRONCHIAL NEEDLE ASPIRATION BIOPSIES;  Surgeon: Garner Nash, DO;  Location: [facility];  Service: Pulmonary;;   CARDIAC CATHETERIZATION     2014   DILATION AND CURETTAGE OF UTERUS     EYE SURGERY Left    X 2   INTERCOSTAL NERVE BLOCK Left 09/28/2021   Procedure: INTERCOSTAL NERVE BLOCK;  Surgeon: Lajuana Matte, MD;  Location: [facility];  Service: Thoracic;  Laterality: Left;   LYMPH NODE DISSECTION Left 09/28/2021   Procedure: LYMPH NODE DISSECTION;  Surgeon: Lajuana Matte, MD;  Location: [facility];  Service: Thoracic;  Laterality: Left;   THORACENTESIS Left 11/17/2021   Procedure: Mathews Robinsons;  Surgeon: Garner Nash, DO;  Location: [facility];  Service: Pulmonary;  Laterality: Left;   VIDEO BRONCHOSCOPY WITH RADIAL ENDOBRONCHIAL ULTRASOUND  08/29/2021   Procedure: RADIAL ENDOBRONCHIAL ULTRASOUND;  Surgeon: Garner Nash, DO;  Location: [facility];  Service: Pulmonary;;     Home Medications:   Prior to Admission medications   Medication Sig Start Date End Date Taking? Authorizing Provider  atorvastatin (LIPITOR) 80 MG tablet Take 1 tablet (80 mg total) by mouth daily. 08/22/21  Yes [name], MD  benazepril (LOTENSIN) 10 MG tablet Take 5 mg by mouth every evening.   Yes [provider]  citalopram (CELEXA) 20 MG tablet Take 40 mg by mouth every evening. 03/05/14  Yes [provider]  cycloSPORINE (RESTASIS) 0.05 % ophthalmic emulsion Place 1 drop into both eyes 2 (two) times daily.    Yes [provider]  ergocalciferol (VITAMIN D2) 1.25 MG (50000 UT) capsule Take 50,000 Units by mouth every Sunday.   Yes [provider]  ezetimibe (ZETIA) 10 MG tablet Take 1 tablet (10 mg total) by mouth daily. 12/15/21 10/11/22 Yes Cleaver, Jossie Ng, NP  famotidine (PEPCID) 20 MG tablet Take 20 mg by mouth at bedtime.   Yes [provider]  gabapentin (NEURONTIN) 300 MG capsule Take 300 mg by mouth daily as needed (for neuropathy).   Yes [provider]  hydrOXYzine (ATARAX) 25 MG tablet Take 25 mg by mouth at bedtime. 09/26/21  Yes [provider]  insulin detemir (LEVEMIR)  100 UNIT/ML injection Inject 10-24 Units into the skin See admin instructions. Inject 24 units subcutaneously in the morning & inject 10 units subcutaneously at night.   Yes [provider]  memantine (NAMENDA) 5 MG tablet Take 10 mg by mouth daily.   Yes [provider]  Zinc Oxide 10 % OINT Apply 1 Application topically daily as needed (for Berger).   Yes [provider]  Blood Glucose Monitoring Suppl (ONE TOUCH ULTRA 2) w/Device KIT SMARTSIG:1 Each Via Meter As Directed 10/24/21   [provider]  Blood Glucose Monitoring Suppl (ONE TOUCH ULTRA 2) w/Device KIT USE TO TEST YOUR BLOOD SUGAR FINGER STICK ONCE A DAY    [provider]  Lancets (ONETOUCH DELICA PLUS [id number]) MISC Apply 1 each topically daily. 10/16/21    [provider]  Donald Siva test strip SMARTSIG:Via Meter 10/01/21   [provider]    Inpatient Medications: Scheduled Meds:  atorvastatin  80 mg Oral Daily   bisoprolol  5 mg Oral Daily   citalopram  40 mg Oral QPM   ezetimibe  10 mg Oral Daily   heparin  5,000 Units Subcutaneous Q8H   hydrOXYzine  25 mg Oral QHS   insulin aspart  0-15 Units Subcutaneous TID WC   insulin aspart  0-5 Units Subcutaneous QHS   memantine  10 mg Oral Daily   Continuous Infusions:  PRN Meds: acetaminophen **OR** acetaminophen, diclofenac Sodium, melatonin, ondansetron **OR** ondansetron (ZOFRAN) IV  Allergies:    Allergies  Allergen Reactions   Sulfa Antibiotics Anaphylaxis and Shortness Of Breath   Pravastatin Other (See Comments)    dizziness   Statins Other (See Comments)    dizziness    Social History:   Social History   Socioeconomic History   Marital status: Widowed    Spouse name: Not on file   Number of children: Not on file   Years of education: Not on file   Highest education level: Not on file  Occupational History   Occupation: retired    Comment: Airline pilot  Tobacco Use   Smoking status: Former    Packs/day: 0.50    Years: 7.00    Total pack years: 3.50    Types: Cigarettes    Quit date: 02/23/2002    Years since quitting: 20.4   Smokeless tobacco: Never  Vaping Use   Vaping Use: Never used  Substance and Sexual Activity   Alcohol use: Yes    Alcohol/week: 14.0 standard drinks of alcohol    Types: 14 Glasses of wine per week    Comment: wine occassionally   Drug use: No   Sexual activity: Not on file  Other Topics Concern   Not on file  Social History Narrative   Not on file   Social Determinants of Health   Financial Resource Strain: Not on file  Food Insecurity: Not on file  Transportation Needs: Not on file  Physical Activity: Not on file  Stress: Not on file  Social Connections: Not on file  Intimate Partner Violence: Not on file     Family History:    Family History  Problem Relation Age of Onset   CAD Mother    CAD Father    Heart attack Father    Colon cancer Brother 41     ROS:  Constitutional: Denied fever, chills, malaise, night sweats Eyes: Denied vision change or loss Ears/Nose/Mouth/Throat: Denied ear ache, sore throat, coughing, sinus pain Cardiovascular: see HPI  Respiratory: denied shortness of  breath Gastrointestinal: Denied nausea, vomiting, abdominal pain, diarrhea Genital/Urinary: Denied dysuria, hematuria, urinary frequency/urgency Musculoskeletal: Denied muscle ache, joint pain, weakness Skin: Denied rash, wound Neuro: see HPI  Psych: history of depression/anxiety  Endocrine: history of diabetes    Physical Exam/Data:   Vitals:   08/13/22 0645 08/13/22 0915 08/13/22 0924 08/13/22 1027  BP: 93/63 106/71  130/86  Pulse: 74 79  66  Resp: 12 15  20   Temp: 98.8 F (37.1 C) 98.4 F (36.9 C)  98.6 F (37 C)  TempSrc: Oral Oral  Oral  SpO2: 95% 97% 97% 100%  Weight:  72.6 kg    Height:  5\' 6"  (1.676 m)      Intake/Output Summary (Last 24 hours) at 08/13/2022 1331 Last data filed at 08/13/2022 1037 Gross per 24 hour  Intake 1240 ml  Output --  Net 1240 ml      08/13/2022    9:15 AM 07/08/2022    8:56 PM 12/15/2021    9:18 AM  Last 3 Weights  Weight (lbs) 160 lb 182 lb 15.7 oz 183 lb  Weight (kg) 72.576 kg 83 kg 83.008 kg     Body mass index is 25.82 kg/m.  Vitals:  Vitals:   08/13/22 0924 08/13/22 1027  BP:  130/86  Pulse:  66  Resp:  20  Temp:  98.6 F (37 C)  SpO2: 97% 100%   General Appearance: In no apparent distress, laying in bed HEENT: Facial bruise noted  Neck: Supple, trachea midline, no JVDs Cardiovascular:Irregular, S1S2, no murmur  Respiratory: Resting breathing unlabored, lungs sounds clear to auscultation bilaterally, no use of accessory muscles. On room air.  No wheezes, rales or rhonchi.   Gastrointestinal: Bowel sounds positive, abdomen soft, obese   Extremities: Able to move all extremities in bed without difficulty, no edema of BLE Musculoskeletal: Normal muscle bulk and tone Skin: Intact, warm, dry. No rashes Neurologic: Alert, oriented to person, place and time. Fluent speech,  no cognitive deficit, no gross focal neuro deficit Psychiatric: Normal affect. Mood is appropriate.     EKG:  The EKG was personally reviewed and demonstrates:    EKG at admission showed sinus rhythm, frequent PVCs, low voltage QRS.   Telemetry:  Telemetry was personally reviewed and demonstrates:    Sinus rhythm at 60-70s, frequent PVCs, ventricular bigeminy noted   Relevant CV Studies:   CT cardiac scoring 06/12/21:  IMPRESSION: 1. Coronary calcium score of 158. This was 78th percentile for age-, race-, and sex-matched controls. 2. Dilated main pulmonary artery to 35 mm, suggestive of pulmonary hypertension. 3. Aortic atherosclerosis. 4. Aortic and mitral annular calcification.  Zio monitor 07/03/21:  1. NSR/SB/ST 2. Freq PVCs and short runs of NSVT 3. Runs of SVT 4. Needs ROV to discuss  Echo from 09/11/21:  1. Left ventricular ejection fraction, by estimation, is 60 to 65%. The  left ventricle has normal function. The left ventricle has no regional  wall motion abnormalities. Left ventricular diastolic parameters were  normal.   2. Right ventricular systolic function is normal. The right ventricular  size is normal.   3. A small pericardial effusion is present. The pericardial effusion is  circumferential. There is no evidence of cardiac tamponade.   4. The mitral valve is normal in structure. Trivial mitral valve  regurgitation. No evidence of mitral stenosis.   5. The aortic valve is tricuspid. Aortic valve regurgitation is not  visualized. No aortic stenosis is present.   6. The inferior vena cava  is normal in size with greater than 50%  respiratory variability, suggesting right atrial pressure of 3 mmHg.   Comparison(s): No  prior Echocardiogram.   Laboratory Data:  High Sensitivity Troponin:   Recent Labs  Lab 08/12/22 2010 08/12/22 2343  TROPONINIHS 4 5     Chemistry Recent Labs  Lab 08/12/22 1814  NA 138  K 3.8  CL 100  CO2 29  GLUCOSE 137*  BUN 11  CREATININE 1.01*  CALCIUM 9.0  GFRNONAA 60*  ANIONGAP 9    No results for input(s): "PROT", "ALBUMIN", "AST", "ALT", "ALKPHOS", "BILITOT" in the last 168 hours. Lipids No results for input(s): "CHOL", "TRIG", "HDL", "LABVLDL", "LDLCALC", "CHOLHDL" in the last 168 hours.  Hematology Recent Labs  Lab 08/12/22 1814  WBC 4.7  RBC 4.63  HGB 14.1  HCT 42.9  MCV 92.7  MCH 30.5  MCHC 32.9  RDW 13.2  PLT 186   Thyroid  Recent Labs  Lab 08/12/22 2343  TSH 1.835    BNPNo results for input(s): "BNP", "PROBNP" in the last 168 hours.  DDimer No results for input(s): "DDIMER" in the last 168 hours.   Radiology/Studies:  CT Head Wo Contrast  Result Date: 08/12/2022 CLINICAL DATA:  Fall from standing height EXAM: CT HEAD WITHOUT CONTRAST CT MAXILLOFACIAL WITHOUT CONTRAST CT CERVICAL SPINE WITHOUT CONTRAST TECHNIQUE: Multidetector CT imaging of the head, cervical spine, and maxillofacial structures were performed using the standard protocol without intravenous contrast. Multiplanar CT image reconstructions of the cervical spine and maxillofacial structures were also generated. RADIATION DOSE REDUCTION: This exam was performed according to the departmental dose-optimization program which includes automated exposure control, adjustment of the mA and/or kV according to patient size and/or use of iterative reconstruction technique. COMPARISON:  None Available. FINDINGS: CT HEAD FINDINGS Brain: No evidence of acute infarction, hemorrhage, hydrocephalus, extra-axial collection or mass lesion/mass effect. Vascular: No hyperdense vessel or unexpected calcification. CT FACIAL BONES FINDINGS Skull: Normal. Negative for fracture or focal lesion. Facial bones: No  displaced fractures or dislocations. Sinuses/Orbits: No acute finding. Other: Soft tissue laceration of the forehead and bridge of the nose. CT CERVICAL SPINE FINDINGS Alignment: Normal. Skull base and vertebrae: No acute fracture. No primary bone lesion or focal pathologic process. Soft tissues and spinal canal: No prevertebral fluid or swelling. No visible canal hematoma. Disc levels: Moderate multilevel cervical disc space height loss and osteophytosis. Upper chest: Negative. Other: None. IMPRESSION: 1. No acute intracranial pathology. 2. No displaced fractures or dislocations of the facial bones. 3. Soft tissue laceration of the forehead and bridge of the nose. 4. No fracture or static subluxation of the cervical spine. 5. Moderate multilevel cervical disc degenerative disease. Electronically Signed   By: Delanna Ahmadi M.D.   On: 08/12/2022 20:47   CT Cervical Spine Wo Contrast  Result Date: 08/12/2022 CLINICAL DATA:  Fall from standing height EXAM: CT HEAD WITHOUT CONTRAST CT MAXILLOFACIAL WITHOUT CONTRAST CT CERVICAL SPINE WITHOUT CONTRAST TECHNIQUE: Multidetector CT imaging of the head, cervical spine, and maxillofacial structures were performed using the standard protocol without intravenous contrast. Multiplanar CT image reconstructions of the cervical spine and maxillofacial structures were also generated. RADIATION DOSE REDUCTION: This exam was performed according to the departmental dose-optimization program which includes automated exposure control, adjustment of the mA and/or kV according to patient size and/or use of iterative reconstruction technique. COMPARISON:  None Available. FINDINGS: CT HEAD FINDINGS Brain: No evidence of acute infarction, hemorrhage, hydrocephalus, extra-axial collection or mass lesion/mass effect. Vascular: No hyperdense vessel  or unexpected calcification. CT FACIAL BONES FINDINGS Skull: Normal. Negative for fracture or focal lesion. Facial bones: No displaced fractures  or dislocations. Sinuses/Orbits: No acute finding. Other: Soft tissue laceration of the forehead and bridge of the nose. CT CERVICAL SPINE FINDINGS Alignment: Normal. Skull base and vertebrae: No acute fracture. No primary bone lesion or focal pathologic process. Soft tissues and spinal canal: No prevertebral fluid or swelling. No visible canal hematoma. Disc levels: Moderate multilevel cervical disc space height loss and osteophytosis. Upper chest: Negative. Other: None. IMPRESSION: 1. No acute intracranial pathology. 2. No displaced fractures or dislocations of the facial bones. 3. Soft tissue laceration of the forehead and bridge of the nose. 4. No fracture or static subluxation of the cervical spine. 5. Moderate multilevel cervical disc degenerative disease. Electronically Signed   By: Delanna Ahmadi M.D.   On: 08/12/2022 20:47   CT Maxillofacial Wo Contrast  Result Date: 08/12/2022 CLINICAL DATA:  Fall from standing height EXAM: CT HEAD WITHOUT CONTRAST CT MAXILLOFACIAL WITHOUT CONTRAST CT CERVICAL SPINE WITHOUT CONTRAST TECHNIQUE: Multidetector CT imaging of the head, cervical spine, and maxillofacial structures were performed using the standard protocol without intravenous contrast. Multiplanar CT image reconstructions of the cervical spine and maxillofacial structures were also generated. RADIATION DOSE REDUCTION: This exam was performed according to the departmental dose-optimization program which includes automated exposure control, adjustment of the mA and/or kV according to patient size and/or use of iterative reconstruction technique. COMPARISON:  None Available. FINDINGS: CT HEAD FINDINGS Brain: No evidence of acute infarction, hemorrhage, hydrocephalus, extra-axial collection or mass lesion/mass effect. Vascular: No hyperdense vessel or unexpected calcification. CT FACIAL BONES FINDINGS Skull: Normal. Negative for fracture or focal lesion. Facial bones: No displaced fractures or dislocations.  Sinuses/Orbits: No acute finding. Other: Soft tissue laceration of the forehead and bridge of the nose. CT CERVICAL SPINE FINDINGS Alignment: Normal. Skull base and vertebrae: No acute fracture. No primary bone lesion or focal pathologic process. Soft tissues and spinal canal: No prevertebral fluid or swelling. No visible canal hematoma. Disc levels: Moderate multilevel cervical disc space height loss and osteophytosis. Upper chest: Negative. Other: None. IMPRESSION: 1. No acute intracranial pathology. 2. No displaced fractures or dislocations of the facial bones. 3. Soft tissue laceration of the forehead and bridge of the nose. 4. No fracture or static subluxation of the cervical spine. 5. Moderate multilevel cervical disc degenerative disease. Electronically Signed   By: Delanna Ahmadi M.D.   On: 08/12/2022 20:47   DG Chest Port 1 View  Result Date: 08/12/2022 CLINICAL DATA:  Recent syncopal episode EXAM: PORTABLE CHEST 1 VIEW COMPARISON:  11/17/2021 FINDINGS: Cardiac shadow is within normal limits. Aortic calcifications are noted. Lungs are clear. Chronic blunting of left costophrenic angle is again noted. No bony abnormality is seen. IMPRESSION: No acute abnormality noted. Chronic blunting of left costophrenic angle is noted. Electronically Signed   By: Inez Catalina M.D.   On: 08/12/2022 18:56     Assessment and Plan:   Syncope - presented with recurrent syncope episodes, had mild dizziness prior, no chest pain or SOB, was in normal state of health  - Hs trop negative x2 - EKG no acute findings  - Zio from 2022 showed frequent PVC, short runs of NSVT, SVT - telemetry showed sinus rhythm with frequent PVCs, ventricular bigeminy, no life threatening arrhythmia  - will update Echo  - consider neurological workup such as MRI of brain, EEG  - history sounds cardiogenic, may need loop recorder  HTN Orthostatic hypotension  - orthostatic lying BP 110/76 AP 74 >>standing BP 91/63 and AP102  - given  ongoing intermittent dizziness, recommend stop antihypertensive, on benazepril PTA   PVCs - ok to continue bisoprolol 5mg  daily   HLD  - continue satin and zetia   Neuroendocrine lung cancer s/p LLL Type 2 DM  Anxiety with depression  - per primary team    Risk Assessment/Risk Scores:    For questions or updates, please contact [facility] Please consult [email or web address] for contact info under    Signed, Margie Billet, NP  08/13/2022 1:31 PM  History and all data above reviewed.  Patient examined.  I agree with the findings as above.   The patient has a history of passing out infrequently over many years.  Workup above mentions a workup she had 2 years ago for syncope without a clear etiology.  The event that brought her here happened after she stood up from the toilet of a few steps.  She did not remember anything.  She has facial trauma.  She apparently did it again when she stood up and thinks that.  She had been otherwise feeling fine.  She has not been otherwise noticing any significant orthostatic symptoms.  She does not feel palpitations.  She has not chest discomfort, neck or arm discomfort.  She had no new shortness of breath, PND or orthopnea.  She was mildly orthostatic as above.  No significant arrhythmias other than PVCs on the monitor.  She has had no sustained bradycardia arrhythmias.  The patient exam reveals COR: Regular rate and rhythm, no murmurs,  Lungs: Lungs clear to auscultation bilaterally,  Abd: Positive bowel sounds normal frequency pitch, bruits, rebound or guarding, Ext 2+ pulses, no edema.  All available labs, radiology testing, previous records reviewed. Agree with documented assessment and plan.  Syncope: The patient had syncope with trauma.  She has no conduction disturbance on her EKG.  This could have been related to orthostatic blood pressure drops but I do need to rule out bradycardia arrhythmia in particular.  She previously wore a monitor that was not  diagnostic.  I like her to have an implantable loop.  Of note her QT is unremarkable.  He does have PVCs but she does not really feel.  There is no family history of syncope or sudden death.  Jeneen Rinks Olevia Westervelt  3:52 PM  08/13/2022

## 2022-08-13 NOTE — Progress Notes (Signed)
Ok to reduce celexa to 20mg  qday due risk of Qtc prolongation per Dr. Si Raider.  Onnie Boer, PharmD, BCIDP, AAHIVP, CPP Infectious Disease Pharmacist 08/13/2022 2:02 PM

## 2022-08-13 NOTE — Progress Notes (Signed)
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  Patient states that she was getting up from using the bathroom and she felt dizzy and passed out.  She fell forward and struck her head onto the carpet.  Daughter states that yesterday patient had 2 episodes of spontaneous syncope while she was sitting in bed talking to her son-in-law.  This episode lasted about 10 or 15 seconds where she has sudden onset of loss of consciousness.  She was not apneic.  She woke up and had no recollection of that time.  Patient denies any palpitations.   Her history of syncope has been evaluated the past by a 30-day monitor.  This was performed in January 2023.  This showed that she had frequent PVCs and short runs of nonsustained ventricular tachycardia.  She also had runs of SVT.  At that time she was referred to EP cardiology.  However a lung nodule was discovered that time and she not having diagnosis of lung cancer.  This issue was lost to follow-up.   Lab workup showed a sodium 138, potassium 3.8, bicarb 29, BUN 11, creatinine 1.0, glucose 137   CBC was unremarkable.   EKG on my interpretation demonstrated normal sinus rhythm with frequent PVCs.   Assessment & Plan:   Principal Problem:   Syncope Active Problems:   Essential hypertension   Hyperlipidemia   S/P lobectomy of lung   Type 2 diabetes mellitus with complication, with long-term current use of insulin (HCC)  Syncope Pt with 30 monitor in 2023 that showed NSVT, SVT. Pt was suppose to be referred to EP/Cards at that time, but her diagnosis of lung cancer took precedence and this issue was lost to followup.  Pt with resting HR  in the low 100s. Some component of this appears to be orthostasis. Ambulated today without symptoms. cardiology following. No events on tele. Trop neg. No respiratory symptoms to suggest pe. Home memantine may also contribute - home benazepril on hold - TTE pending - cardiology considering implantable event recorder - continue tele   Memory loss - hold home memantine as hypotension, dizziness are relatively common side effects   Type 2 diabetes mellitus with complication, with long-term current use of insulin (HCC) Controlled - SSI,   Facial trauma after fall Scratch between eyes, significant bruising, nothing that needs suturing, CT of face no fractures   S/P lobectomy of lung Pt has achieved surgical curse. Cancer was neuroendocrine. Pt has had f/u with oncology who is not planning any chemo or XRT.   Hyperlipidemia Continue zetia and lipitor.   Essential hypertension Stop benazepril  Frequent PVCs Admitter started bisoprolol 5 mg daily and cardiology ok with continuing that   DVT prophylaxis: heparin Code Status: full Family Communication: none @ bedside  Level of care: Telemetry Cardiac Status is: Observation    Consultants:  cardiology  Procedures: none  Antimicrobials:  none    Subjective: Feeling well  Objective: Vitals:   08/13/22 0915 08/13/22 0924 08/13/22 1027 08/13/22 1518  BP: 106/71  130/86 109/63  Pulse: 79  66 75  Resp: 15  20 16   Temp: 98.4 F (36.9 C)  98.6 F (37 C)  98.1 F (36.7 C)  TempSrc: Oral  Oral Oral  SpO2: 97% 97% 100% 99%  Weight: 72.6 kg     Height: 5\' 6"  (1.676 m)       Intake/Output Summary (Last 24 hours) at 08/13/2022 1717 Last data filed at 08/13/2022 1037 Gross per 24 hour  Intake 1240 ml  Output --  Net 1240 ml   Filed Weights   08/13/22 0915  Weight: 72.6 kg    Examination:  General exam: Appears calm and comfortable  Respiratory system: Clear to auscultation. Respiratory effort  normal. Cardiovascular system: S1 & S2 heard, RRR. No JVD, murmurs, rubs, gallops or clicks. No pedal edema. Gastrointestinal system: Abdomen is nondistended, soft and nontender. No organomegaly or masses felt. Normal bowel sounds heard. Central nervous system: Alert and oriented. No focal neurological deficits. Extremities: Symmetric 5 x 5 power. Skin: bruising below eyes Psychiatry: Judgement and insight appear normal. Mood & affect appropriate.     Data Reviewed: I have personally reviewed following labs and imaging studies  CBC: Recent Labs  Lab 08/12/22 1814  WBC 4.7  HGB 14.1  HCT 42.9  MCV 92.7  PLT 540   Basic Metabolic Panel: Recent Labs  Lab 08/12/22 1814  NA 138  K 3.8  CL 100  CO2 29  GLUCOSE 137*  BUN 11  CREATININE 1.01*  CALCIUM 9.0   GFR: Estimated Creatinine Clearance: 52.1 mL/min (A) (by C-G formula based on SCr of 1.01 mg/dL (H)). Liver Function Tests: No results for input(s): "AST", "ALT", "ALKPHOS", "BILITOT", "PROT", "ALBUMIN" in the last 168 hours. No results for input(s): "LIPASE", "AMYLASE" in the last 168 hours. No results for input(s): "AMMONIA" in the last 168 hours. Coagulation Profile: No results for input(s): "INR", "PROTIME" in the last 168 hours. Cardiac Enzymes: No results for input(s): "CKTOTAL", "CKMB", "CKMBINDEX", "TROPONINI" in the last 168 hours. BNP (last 3 results) No results for input(s): "PROBNP" in the last 8760 hours. HbA1C: Recent Labs    08/12/22 1814  HGBA1C 6.4*   CBG: Recent Labs  Lab 08/12/22 1839 08/13/22 0816 08/13/22 0906 08/13/22 1032 08/13/22 1715  GLUCAP 127* 137* 136* 112* 142*   Lipid Profile: No results for input(s): "CHOL", "HDL", "LDLCALC", "TRIG", "CHOLHDL", "LDLDIRECT" in the last 72 hours. Thyroid Function Tests: Recent Labs    08/12/22 2343  TSH 1.835   Anemia Panel: No results for input(s): "VITAMINB12", "FOLATE", "FERRITIN", "TIBC", "IRON", "RETICCTPCT" in the last 72  hours. Urine analysis:    Component Value Date/Time   COLORURINE AMBER (A) 09/27/2021 1432   APPEARANCEUR CLOUDY (A) 09/27/2021 1432   LABSPEC 1.025 09/27/2021 1432   PHURINE 7.0 09/27/2021 1432   GLUCOSEU NEGATIVE 09/27/2021 1432   HGBUR NEGATIVE 09/27/2021 1432   BILIRUBINUR NEGATIVE 09/27/2021 1432   KETONESUR NEGATIVE 09/27/2021 1432   PROTEINUR 30 (A) 09/27/2021 1432   NITRITE NEGATIVE 09/27/2021 1432   LEUKOCYTESUR LARGE (A) 09/27/2021 1432   Sepsis Labs: @LABRCNTIP (procalcitonin:4,lacticidven:4)  )No results found for this or any previous visit (from the past 240 hour(s)).       Radiology Studies: CT Head Wo Contrast  Result Date: 08/12/2022 CLINICAL DATA:  Fall from standing height EXAM: CT HEAD WITHOUT CONTRAST CT MAXILLOFACIAL WITHOUT CONTRAST CT CERVICAL SPINE WITHOUT CONTRAST TECHNIQUE: Multidetector CT imaging of the head, cervical spine, and maxillofacial structures were performed using the standard protocol without intravenous contrast. Multiplanar CT image reconstructions of the cervical spine and maxillofacial structures were also generated. RADIATION DOSE REDUCTION: This exam was performed according to  the departmental dose-optimization program which includes automated exposure control, adjustment of the mA and/or kV according to patient size and/or use of iterative reconstruction technique. COMPARISON:  None Available. FINDINGS: CT HEAD FINDINGS Brain: No evidence of acute infarction, hemorrhage, hydrocephalus, extra-axial collection or mass lesion/mass effect. Vascular: No hyperdense vessel or unexpected calcification. CT FACIAL BONES FINDINGS Skull: Normal. Negative for fracture or focal lesion. Facial bones: No displaced fractures or dislocations. Sinuses/Orbits: No acute finding. Other: Soft tissue laceration of the forehead and bridge of the nose. CT CERVICAL SPINE FINDINGS Alignment: Normal. Skull base and vertebrae: No acute fracture. No primary bone lesion or  focal pathologic process. Soft tissues and spinal canal: No prevertebral fluid or swelling. No visible canal hematoma. Disc levels: Moderate multilevel cervical disc space height loss and osteophytosis. Upper chest: Negative. Other: None. IMPRESSION: 1. No acute intracranial pathology. 2. No displaced fractures or dislocations of the facial bones. 3. Soft tissue laceration of the forehead and bridge of the nose. 4. No fracture or static subluxation of the cervical spine. 5. Moderate multilevel cervical disc degenerative disease. Electronically Signed   By: Delanna Ahmadi M.D.   On: 08/12/2022 20:47   CT Cervical Spine Wo Contrast  Result Date: 08/12/2022 CLINICAL DATA:  Fall from standing height EXAM: CT HEAD WITHOUT CONTRAST CT MAXILLOFACIAL WITHOUT CONTRAST CT CERVICAL SPINE WITHOUT CONTRAST TECHNIQUE: Multidetector CT imaging of the head, cervical spine, and maxillofacial structures were performed using the standard protocol without intravenous contrast. Multiplanar CT image reconstructions of the cervical spine and maxillofacial structures were also generated. RADIATION DOSE REDUCTION: This exam was performed according to the departmental dose-optimization program which includes automated exposure control, adjustment of the mA and/or kV according to patient size and/or use of iterative reconstruction technique. COMPARISON:  None Available. FINDINGS: CT HEAD FINDINGS Brain: No evidence of acute infarction, hemorrhage, hydrocephalus, extra-axial collection or mass lesion/mass effect. Vascular: No hyperdense vessel or unexpected calcification. CT FACIAL BONES FINDINGS Skull: Normal. Negative for fracture or focal lesion. Facial bones: No displaced fractures or dislocations. Sinuses/Orbits: No acute finding. Other: Soft tissue laceration of the forehead and bridge of the nose. CT CERVICAL SPINE FINDINGS Alignment: Normal. Skull base and vertebrae: No acute fracture. No primary bone lesion or focal pathologic  process. Soft tissues and spinal canal: No prevertebral fluid or swelling. No visible canal hematoma. Disc levels: Moderate multilevel cervical disc space height loss and osteophytosis. Upper chest: Negative. Other: None. IMPRESSION: 1. No acute intracranial pathology. 2. No displaced fractures or dislocations of the facial bones. 3. Soft tissue laceration of the forehead and bridge of the nose. 4. No fracture or static subluxation of the cervical spine. 5. Moderate multilevel cervical disc degenerative disease. Electronically Signed   By: Delanna Ahmadi M.D.   On: 08/12/2022 20:47   CT Maxillofacial Wo Contrast  Result Date: 08/12/2022 CLINICAL DATA:  Fall from standing height EXAM: CT HEAD WITHOUT CONTRAST CT MAXILLOFACIAL WITHOUT CONTRAST CT CERVICAL SPINE WITHOUT CONTRAST TECHNIQUE: Multidetector CT imaging of the head, cervical spine, and maxillofacial structures were performed using the standard protocol without intravenous contrast. Multiplanar CT image reconstructions of the cervical spine and maxillofacial structures were also generated. RADIATION DOSE REDUCTION: This exam was performed according to the departmental dose-optimization program which includes automated exposure control, adjustment of the mA and/or kV according to patient size and/or use of iterative reconstruction technique. COMPARISON:  None Available. FINDINGS: CT HEAD FINDINGS Brain: No evidence of acute infarction, hemorrhage, hydrocephalus, extra-axial collection or mass lesion/mass effect. Vascular: No  hyperdense vessel or unexpected calcification. CT FACIAL BONES FINDINGS Skull: Normal. Negative for fracture or focal lesion. Facial bones: No displaced fractures or dislocations. Sinuses/Orbits: No acute finding. Other: Soft tissue laceration of the forehead and bridge of the nose. CT CERVICAL SPINE FINDINGS Alignment: Normal. Skull base and vertebrae: No acute fracture. No primary bone lesion or focal pathologic process. Soft tissues  and spinal canal: No prevertebral fluid or swelling. No visible canal hematoma. Disc levels: Moderate multilevel cervical disc space height loss and osteophytosis. Upper chest: Negative. Other: None. IMPRESSION: 1. No acute intracranial pathology. 2. No displaced fractures or dislocations of the facial bones. 3. Soft tissue laceration of the forehead and bridge of the nose. 4. No fracture or static subluxation of the cervical spine. 5. Moderate multilevel cervical disc degenerative disease. Electronically Signed   By: Delanna Ahmadi M.D.   On: 08/12/2022 20:47   DG Chest Port 1 View  Result Date: 08/12/2022 CLINICAL DATA:  Recent syncopal episode EXAM: PORTABLE CHEST 1 VIEW COMPARISON:  11/17/2021 FINDINGS: Cardiac shadow is within normal limits. Aortic calcifications are noted. Lungs are clear. Chronic blunting of left costophrenic angle is again noted. No bony abnormality is seen. IMPRESSION: No acute abnormality noted. Chronic blunting of left costophrenic angle is noted. Electronically Signed   By: Inez Catalina M.D.   On: 08/12/2022 18:56        Scheduled Meds:  atorvastatin  80 mg Oral Daily   bisoprolol  5 mg Oral Daily   citalopram  20 mg Oral QPM   ezetimibe  10 mg Oral Daily   heparin  5,000 Units Subcutaneous Q8H   hydrOXYzine  25 mg Oral QHS   insulin aspart  0-15 Units Subcutaneous TID WC   insulin aspart  0-5 Units Subcutaneous QHS   memantine  10 mg Oral Daily   Continuous Infusions:   LOS: 0 days     Desma Maxim, MD [facility]   If 7PM-7AM, please contact night-coverage [email or web address] Password [facility] 08/13/2022, 5:17 PM

## 2022-08-13 NOTE — ED Notes (Signed)
ED TO INPATIENT HANDOFF REPORT  ED Nurse Name and Phone #: [phone]  S Name/Age/Gender Abigail Miyamoto 72 y.o. female Room/Bed: 040C/040C  Code Status   Code Status: Full Code  Home/SNF/Other Home Patient oriented to: self, place, time, and situation Is this baseline? Yes   Triage Complete: Triage complete  Chief Complaint Syncope [R55]  Triage Note Patient BIB [facility] from home for evaluation of syncope, patient fell from standing and hit left side of forehead. Patient denies anticoagulation, is alert, oriented, and in no apparent distress at this time.   Allergies Allergies  Allergen Reactions   Sulfa Antibiotics Anaphylaxis and Shortness Of Breath   Pravastatin Other (See Comments)    dizziness   Statins Other (See Comments)    dizziness    Level of Care/Admitting Diagnosis ED Disposition     ED Disposition  Admit   Condition  --   Comment  Hospital Area: [place] [[id number]]  Level of Care: Telemetry Cardiac [103]  May place patient in observation at [facility] or [place] if equivalent level of care is available:: No  Covid Evaluation: Asymptomatic - no recent exposure (last 10 days) testing not required  Diagnosis: Syncope [206001]  Admitting Physician: Bridgett Larsson, [place]  Attending Physician: Bridgett Larsson, ERIC [[id number]]          B Medical/Surgery History Past Medical History:  Diagnosis Date   Anxiety and depression    Chronic back pain    Diabetes mellitus without complication ([place])    DJD (degenerative joint disease)    Frequent PVCs    GERD (gastroesophageal reflux disease)    Hyperlipidemia    Hypertension    Memory difficulties    Syncope    Past Surgical History:  Procedure Laterality Date   ABDOMINAL HYSTERECTOMY     BRONCHIAL BIOPSY  08/29/2021   Procedure: BRONCHIAL BIOPSIES;  Surgeon: Garner Nash, DO;  Location: [place];  Service: Pulmonary;;   BRONCHIAL BRUSHINGS  08/29/2021   Procedure: BRONCHIAL  BRUSHINGS;  Surgeon: Garner Nash, DO;  Location: [place];  Service: Pulmonary;;   BRONCHIAL NEEDLE ASPIRATION BIOPSY  08/29/2021   Procedure: BRONCHIAL NEEDLE ASPIRATION BIOPSIES;  Surgeon: Garner Nash, DO;  Location: [facility];  Service: Pulmonary;;   CARDIAC CATHETERIZATION     2014   DILATION AND CURETTAGE OF UTERUS     EYE SURGERY Left    X 2   INTERCOSTAL NERVE BLOCK Left 09/28/2021   Procedure: INTERCOSTAL NERVE BLOCK;  Surgeon: Lajuana Matte, MD;  Location: [place];  Service: Thoracic;  Laterality: Left;   LYMPH NODE DISSECTION Left 09/28/2021   Procedure: LYMPH NODE DISSECTION;  Surgeon: Lajuana Matte, MD;  Location: [place];  Service: Thoracic;  Laterality: Left;   THORACENTESIS Left 11/17/2021   Procedure: [facility] Robinsons;  Surgeon: Garner Nash, DO;  Location: [facility];  Service: Pulmonary;  Laterality: Left;   VIDEO BRONCHOSCOPY WITH RADIAL ENDOBRONCHIAL ULTRASOUND  08/29/2021   Procedure: RADIAL ENDOBRONCHIAL ULTRASOUND;  Surgeon: Garner Nash, DO;  Location: [facility];  Service: Pulmonary;;     A IV Location/Drains/Wounds Patient Lines/Drains/Airways Status     Active Line/Drains/Airways     Name Placement date Placement time Site Days   Peripheral IV 08/12/22 22 G Anterior;Left Forearm 08/12/22  2017  Forearm  1            Intake/Output Last 24 hours  Intake/Output Summary (Last 24 hours) at 08/13/2022 0914 Last data filed at 08/13/2022 0254 Gross  per 24 hour  Intake 1000 ml  Output --  Net 1000 ml    Labs/Imaging Results for orders placed or performed during the hospital encounter of 08/12/22 (from the past 48 hour(s))  Basic metabolic panel     Status: Abnormal   Collection Time: 08/12/22  6:14 PM  Result Value Ref Range   Sodium 138 135 - 145 mmol/L   Potassium 3.8 3.5 - 5.1 mmol/L   Chloride 100 98 - 111 mmol/L   CO2 29 22 - 32 mmol/L   Glucose, Bld 137 (H) 70 - 99 mg/dL    Comment: Glucose reference range applies  only to samples taken after fasting for at least 8 hours.   BUN 11 8 - 23 mg/dL   Creatinine, Ser 1.01 (H) 0.44 - 1.00 mg/dL   Calcium 9.0 8.9 - 10.3 mg/dL   GFR, Estimated 60 (L) >60 mL/min    Comment: (NOTE) Calculated using the CKD-EPI Creatinine Equation (2021)    Anion gap 9 5 - 15    Comment: Performed at [facility] [address], [place], [place] [address]  CBC     Status: None   Collection Time: 08/12/22  6:14 PM  Result Value Ref Range   WBC 4.7 4.0 - 10.5 K/uL   RBC 4.63 3.87 - 5.11 MIL/uL   Hemoglobin 14.1 12.0 - 15.0 g/dL   HCT 42.9 36.0 - 46.0 %   MCV 92.7 80.0 - 100.0 fL   MCH 30.5 26.0 - 34.0 pg   MCHC 32.9 30.0 - 36.0 g/dL   RDW 13.2 11.5 - 15.5 %   Platelets 186 150 - 400 K/uL   nRBC 0.0 0.0 - 0.2 %    Comment: Performed at [facility], [place] [address] [place], [place] [address]  Hemoglobin A1c     Status: Abnormal   Collection Time: 08/12/22  6:14 PM  Result Value Ref Range   Hgb A1c MFr Bld 6.4 (H) 4.8 - 5.6 %    Comment: (NOTE) Pre diabetes:          5.7%-6.4%  Diabetes:              >6.4%  Glycemic control for   <7.0% adults with diabetes    Mean Plasma Glucose 136.98 mg/dL    Comment: Performed at [facility] [address], [place], [place] [address]  CBG monitoring, ED     Status: Abnormal   Collection Time: 08/12/22  6:39 PM  Result Value Ref Range   Glucose-Capillary 127 (H) 70 - 99 mg/dL    Comment: Glucose reference range applies only to samples taken after fasting for at least 8 hours.  Troponin I (High Sensitivity)     Status: None   Collection Time: 08/12/22  8:10 PM  Result Value Ref Range   Troponin I (High Sensitivity) 4 <18 ng/L    Comment: (NOTE) Elevated high sensitivity troponin I (hsTnI) values and significant  changes across serial measurements may suggest ACS but many other  chronic and acute conditions are known to elevate hsTnI results.  Refer to the "Links" section for chest pain algorithms and  additional  guidance. Performed at [facility], [place] [address], [place], [place] [address]   Troponin I (High Sensitivity)     Status: None   Collection Time: 08/12/22 11:43 PM  Result Value Ref Range   Troponin I (High Sensitivity) 5 <18 ng/L    Comment: (NOTE) Elevated high  sensitivity troponin I (hsTnI) values and significant  changes across serial measurements may suggest ACS but many other  chronic and acute conditions are known to elevate hsTnI results.  Refer to the "Links" section for chest pain algorithms and additional  guidance. Performed at [facility], [place] [address], [place], [place] [address]   TSH     Status: None   Collection Time: 08/12/22 11:43 PM  Result Value Ref Range   TSH 1.835 0.350 - 4.500 uIU/mL    Comment: Performed by a 3rd Generation assay with a functional sensitivity of <=0.01 uIU/mL. Performed at [facility], [place] [address] [place], [place] [address]   CBG monitoring, ED     Status: Abnormal   Collection Time: 08/13/22  8:16 AM  Result Value Ref Range   Glucose-Capillary 137 (H) 70 - 99 mg/dL    Comment: Glucose reference range applies only to samples taken after fasting for at least 8 hours.  CBG monitoring, ED     Status: Abnormal   Collection Time: 08/13/22  9:06 AM  Result Value Ref Range   Glucose-Capillary 136 (H) 70 - 99 mg/dL    Comment: Glucose reference range applies only to samples taken after fasting for at least 8 hours.   CT Head Wo Contrast  Result Date: 08/12/2022 CLINICAL DATA:  Fall from standing height EXAM: CT HEAD WITHOUT CONTRAST CT MAXILLOFACIAL WITHOUT CONTRAST CT CERVICAL SPINE WITHOUT CONTRAST TECHNIQUE: Multidetector CT imaging of the head, cervical spine, and maxillofacial structures were performed using the standard protocol without intravenous contrast. Multiplanar CT image reconstructions of the cervical spine and maxillofacial structures were also generated. RADIATION DOSE REDUCTION: This exam  was performed according to the departmental dose-optimization program which includes automated exposure control, adjustment of the mA and/or kV according to patient size and/or use of iterative reconstruction technique. COMPARISON:  None Available. FINDINGS: CT HEAD FINDINGS Brain: No evidence of acute infarction, hemorrhage, hydrocephalus, extra-axial collection or mass lesion/mass effect. Vascular: No hyperdense vessel or unexpected calcification. CT FACIAL BONES FINDINGS Skull: Normal. Negative for fracture or focal lesion. Facial bones: No displaced fractures or dislocations. Sinuses/Orbits: No acute finding. Other: Soft tissue laceration of the forehead and bridge of the nose. CT CERVICAL SPINE FINDINGS Alignment: Normal. Skull base and vertebrae: No acute fracture. No primary bone lesion or focal pathologic process. Soft tissues and spinal canal: No prevertebral fluid or swelling. No visible canal hematoma. Disc levels: Moderate multilevel cervical disc space height loss and osteophytosis. Upper chest: Negative. Other: None. IMPRESSION: 1. No acute intracranial pathology. 2. No displaced fractures or dislocations of the facial bones. 3. Soft tissue laceration of the forehead and bridge of the nose. 4. No fracture or static subluxation of the cervical spine. 5. Moderate multilevel cervical disc degenerative disease. Electronically Signed   By: Delanna Ahmadi M.D.   On: 08/12/2022 20:47   CT Cervical Spine Wo Contrast  Result Date: 08/12/2022 CLINICAL DATA:  Fall from standing height EXAM: CT HEAD WITHOUT CONTRAST CT MAXILLOFACIAL WITHOUT CONTRAST CT CERVICAL SPINE WITHOUT CONTRAST TECHNIQUE: Multidetector CT imaging of the head, cervical spine, and maxillofacial structures were performed using the standard protocol without intravenous contrast. Multiplanar CT image reconstructions of the cervical spine and maxillofacial structures were also generated. RADIATION DOSE REDUCTION: This exam was performed  according to the departmental dose-optimization program which includes automated exposure control, adjustment of the mA and/or kV according to patient size and/or use of iterative reconstruction technique. COMPARISON:  None Available. FINDINGS:  CT HEAD FINDINGS Brain: No evidence of acute infarction, hemorrhage, hydrocephalus, extra-axial collection or mass lesion/mass effect. Vascular: No hyperdense vessel or unexpected calcification. CT FACIAL BONES FINDINGS Skull: Normal. Negative for fracture or focal lesion. Facial bones: No displaced fractures or dislocations. Sinuses/Orbits: No acute finding. Other: Soft tissue laceration of the forehead and bridge of the nose. CT CERVICAL SPINE FINDINGS Alignment: Normal. Skull base and vertebrae: No acute fracture. No primary bone lesion or focal pathologic process. Soft tissues and spinal canal: No prevertebral fluid or swelling. No visible canal hematoma. Disc levels: Moderate multilevel cervical disc space height loss and osteophytosis. Upper chest: Negative. Other: None. IMPRESSION: 1. No acute intracranial pathology. 2. No displaced fractures or dislocations of the facial bones. 3. Soft tissue laceration of the forehead and bridge of the nose. 4. No fracture or static subluxation of the cervical spine. 5. Moderate multilevel cervical disc degenerative disease. Electronically Signed   By: Delanna Ahmadi M.D.   On: 08/12/2022 20:47   CT Maxillofacial Wo Contrast  Result Date: 08/12/2022 CLINICAL DATA:  Fall from standing height EXAM: CT HEAD WITHOUT CONTRAST CT MAXILLOFACIAL WITHOUT CONTRAST CT CERVICAL SPINE WITHOUT CONTRAST TECHNIQUE: Multidetector CT imaging of the head, cervical spine, and maxillofacial structures were performed using the standard protocol without intravenous contrast. Multiplanar CT image reconstructions of the cervical spine and maxillofacial structures were also generated. RADIATION DOSE REDUCTION: This exam was performed according to the  departmental dose-optimization program which includes automated exposure control, adjustment of the mA and/or kV according to patient size and/or use of iterative reconstruction technique. COMPARISON:  None Available. FINDINGS: CT HEAD FINDINGS Brain: No evidence of acute infarction, hemorrhage, hydrocephalus, extra-axial collection or mass lesion/mass effect. Vascular: No hyperdense vessel or unexpected calcification. CT FACIAL BONES FINDINGS Skull: Normal. Negative for fracture or focal lesion. Facial bones: No displaced fractures or dislocations. Sinuses/Orbits: No acute finding. Other: Soft tissue laceration of the forehead and bridge of the nose. CT CERVICAL SPINE FINDINGS Alignment: Normal. Skull base and vertebrae: No acute fracture. No primary bone lesion or focal pathologic process. Soft tissues and spinal canal: No prevertebral fluid or swelling. No visible canal hematoma. Disc levels: Moderate multilevel cervical disc space height loss and osteophytosis. Upper chest: Negative. Other: None. IMPRESSION: 1. No acute intracranial pathology. 2. No displaced fractures or dislocations of the facial bones. 3. Soft tissue laceration of the forehead and bridge of the nose. 4. No fracture or static subluxation of the cervical spine. 5. Moderate multilevel cervical disc degenerative disease. Electronically Signed   By: Delanna Ahmadi M.D.   On: 08/12/2022 20:47   DG Chest Port 1 View  Result Date: 08/12/2022 CLINICAL DATA:  Recent syncopal episode EXAM: PORTABLE CHEST 1 VIEW COMPARISON:  11/17/2021 FINDINGS: Cardiac shadow is within normal limits. Aortic calcifications are noted. Lungs are clear. Chronic blunting of left costophrenic angle is again noted. No bony abnormality is seen. IMPRESSION: No acute abnormality noted. Chronic blunting of left costophrenic angle is noted. Electronically Signed   By: Inez Catalina M.D.   On: 08/12/2022 18:56    Pending Labs Unresulted Labs (From admission, onward)    None        Vitals/Pain Today's Vitals   08/13/22 0545 08/13/22 0600 08/13/22 0630 08/13/22 0645  BP: 113/62 125/63 (!) 110/47 93/63  Pulse: 72 80 71 74  Resp: 10 (!) 26 11 12   Temp:    98.8 F (37.1 C)  TempSrc:    Oral  SpO2: 93% 96% 94% 95%  PainSc:        Isolation Precautions No active isolations  Medications Medications  bisoprolol (ZEBETA) tablet 5 mg (5 mg Oral Given 08/13/22 0912)  ezetimibe (ZETIA) tablet 10 mg (10 mg Oral Given 08/13/22 0912)  citalopram (CELEXA) tablet 40 mg (has no administration in time range)  hydrOXYzine (ATARAX) tablet 25 mg (has no administration in time range)  memantine (NAMENDA) tablet 10 mg (10 mg Oral Given 08/13/22 0911)  insulin aspart (novoLOG) injection 0-15 Units (2 Units Subcutaneous Given 08/13/22 0910)  insulin aspart (novoLOG) injection 0-5 Units (has no administration in time range)  atorvastatin (LIPITOR) tablet 80 mg (80 mg Oral Given 08/13/22 0912)  heparin injection 5,000 Units (5,000 Units Subcutaneous Given 08/13/22 0657)  acetaminophen (TYLENOL) tablet 650 mg (has no administration in time range)    Or  acetaminophen (TYLENOL) suppository 650 mg (has no administration in time range)  ondansetron (ZOFRAN) tablet 4 mg (has no administration in time range)    Or  ondansetron (ZOFRAN) injection 4 mg (has no administration in time range)  melatonin tablet 10 mg (has no administration in time range)  sodium chloride 0.9 % bolus 1,000 mL (0 mLs Intravenous Stopped 08/13/22 0254)  Tdap (BOOSTRIX) injection 0.5 mL (0.5 mLs Intramuscular Given 08/12/22 2334)  potassium chloride SA (KLOR-CON M) CR tablet 40 mEq (40 mEq Oral Given 08/12/22 2332)    Mobility walks with device     Focused Assessments Neuro Assessment Handoff:  Swallow screen pass?  Did not have a swallow eval         Neuro Assessment:   Neuro Checks:      Has TPA been given? No If patient is a Neuro Trauma and patient is going to OR before floor call report to [facility] nurse: [phone] or [phone]   R Recommendations: See Admitting Provider Note  Report given to:   Additional Notes: Pt is a fall risk. She is a A [employer] 4, but sometimes have memory lapse. Pt has been getting by herself and going to the bathroom. She uses a cane at home. Bruises on face, because of the fall

## 2022-08-14 ENCOUNTER — Observation Stay (HOSPITAL_BASED_OUTPATIENT_CLINIC_OR_DEPARTMENT_OTHER): Payer: Medicare HMO

## 2022-08-14 ENCOUNTER — Encounter (HOSPITAL_COMMUNITY)
Admission: EM | Disposition: A | Discharge status: Home / Self Care | Payer: Self-pay | Source: Home / Self Care | Attending: Emergency Medicine

## 2022-08-14 DIAGNOSIS — R55 Syncope and collapse: Secondary | ICD-10-CM | POA: Diagnosis not present

## 2022-08-14 DIAGNOSIS — Z902 Acquired absence of lung [part of]: Secondary | ICD-10-CM | POA: Diagnosis not present

## 2022-08-14 HISTORY — PX: LOOP RECORDER INSERTION: EP1214

## 2022-08-14 LAB — ECHOCARDIOGRAM COMPLETE
AR max vel: 2.45 cm2
AV Area VTI: 2.54 cm2
AV Area mean vel: 2.42 cm2
AV Mean grad: 3 mmHg
AV Peak grad: 6.4 mmHg
Ao pk vel: 1.26 m/s
Area-P 1/2: 4.15 cm2
Height: 66 in
MV VTI: 2.4 cm2
S' Lateral: 3 cm
Weight: 3056 oz

## 2022-08-14 LAB — GLUCOSE, CAPILLARY
Glucose-Capillary: 136 mg/dL — ABNORMAL HIGH (ref 70–99)
Glucose-Capillary: 134 mg/dL — ABNORMAL HIGH (ref 70–99)
Glucose-Capillary: 124 mg/dL — ABNORMAL HIGH (ref 70–99)
Glucose-Capillary: 109 mg/dL — ABNORMAL HIGH (ref 70–99)

## 2022-08-14 LAB — BASIC METABOLIC PANEL
Anion gap: 9 (ref 5–15)
BUN: 15 mg/dL (ref 8–23)
CO2: 26 mmol/L (ref 22–32)
Calcium: 8.6 mg/dL — ABNORMAL LOW (ref 8.9–10.3)
Chloride: 104 mmol/L (ref 98–111)
Creatinine, Ser: 1.08 mg/dL — ABNORMAL HIGH (ref 0.44–1.00)
GFR, Estimated: 55 mL/min — ABNORMAL LOW (ref >60)
Glucose, Bld: 126 mg/dL — ABNORMAL HIGH (ref 70–99)
Potassium: 3.9 mmol/L (ref 3.5–5.1)
Sodium: 139 mmol/L (ref 135–145)

## 2022-08-14 SURGERY — LOOP RECORDER INSERTION

## 2022-08-14 MED ORDER — LIDOCAINE-EPINEPHRINE 1 %-1:100000 IJ SOLN
INTRAMUSCULAR | Status: AC
  Filled 2022-08-14: qty 1

## 2022-08-14 MED ORDER — LIDOCAINE-EPINEPHRINE 1 %-1:100000 IJ SOLN
INTRAMUSCULAR | Status: DC | PRN
  Administered 2022-08-14: 10 mL

## 2022-08-14 MED ORDER — BISOPROLOL FUMARATE 5 MG PO TABS: 5.0000 mg | ORAL_TABLET | Freq: Every day | ORAL | 0 refills | Status: DC

## 2022-08-14 SURGICAL SUPPLY — 2 items
MONITOR BIOMONITOR IV (Prosthesis & Implant Heart) IMPLANT
PACK LOOP INSERTION (CUSTOM PROCEDURE TRAY) ×1 IMPLANT

## 2022-08-14 NOTE — Care Management Obs Status (Signed)
[employer] NOTIFICATION   Patient Details  Name: Alexandra Berger MRN: [id number] Date of Birth: 28-Jun-1950   Medicare Observation Status Notification Given:  Yes    Carles Collet, RN 08/14/2022, 8:17 AM

## 2022-08-14 NOTE — Progress Notes (Signed)
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  Cardiology was consulted.    Assessment & Plan:   Syncope -Presented with syncopal episodes at home.  Mostly cardiac in nature. -Cardiology following.  Follow 2D echo.  Cardiology recommending loop replacement.  EP has been consulted. -PT eval.  No further syncope since admission  Hypertension Orthostatic hypotension -Blood pressure still intermittently on the lower side.  ACE inhibitor has been discontinued.  Currently on bisoprolol  History of PVCs -Continue bisoprolol as per cardiology  Facial trauma after fall -CT of the face showed no fractures.  Hyperlipidemia -Continue statin and ezetimibe  Memory impairment -Memantine on hold as this might cause hypotension/dizziness.  Outpatient follow-up with PCP/neurology  Diabetes mellitus type 2  -A1c 6.4.  Continue CBGs with SSI.  History of neuroendocrine lung cancer status post left lower lobe lobectomy -Outpatient follow-up with oncology  Obesity -Outpatient follow-up  DVT prophylaxis: Heparin Code Status: Full Family Communication: None at bedside Disposition Plan: Status is: Observation The patient will require care spanning > 2 midnights and should be moved to inpatient because: Of severity of illness.  Need for loop recorder placement.  PT evaluation pending.  Consultants: Cardiology  Procedures: Echo report pending  Antimicrobials: None   Subjective: Patient seen and examined at bedside.  Denies any fever, vomiting, chest pain or worsening shortness  breath.  Objective: Vitals:   08/14/22 0811 08/14/22 0853 08/14/22 0857 08/14/22 1028  BP: (!) 107/50 93/66 105/68 117/62  Pulse: 74 71 69 66  Resp: 18   16  Temp: 98.7 F (37.1 C)   97.9 F (36.6 C)  TempSrc: Oral   Oral  SpO2: 97%   95%  Weight:      Height:        Intake/Output Summary (Last 24 hours) at 08/14/2022 1235 Last data filed at 08/14/2022 0809 Gross per 24 hour  Intake 420 ml  Output --  Net 420 ml   Filed Weights   08/13/22 0915 08/14/22 0500  Weight: 72.6 kg 86.6 kg    Examination:  General exam: Appears calm and comfortable.  Currently on room air.  Bruising below eyes.   Respiratory system: Bilateral decreased breath sounds at bases, no wheezing Cardiovascular system: S1 & S2 heard, Rate controlled Gastrointestinal system: Abdomen is obese, nondistended, soft and nontender. Normal bowel sounds heard. Extremities: No cyanosis, clubbing; trace lower extremity edema  Data Reviewed: I have personally reviewed following labs and imaging studies  CBC: Recent Labs  Lab 08/12/22 1814  WBC 4.7  HGB 14.1  HCT 42.9  MCV 92.7  PLT 413   Basic Metabolic Panel: Recent Labs  Lab 08/12/22 1814 08/14/22 0047  NA 138 139  K 3.8 3.9  CL 100 104  CO2 29 26  GLUCOSE 137* 126*  BUN 11 15  CREATININE 1.01* 1.08*  CALCIUM 9.0 8.6*   GFR: Estimated Creatinine Clearance: 52.9 mL/min (A) (by C-G formula based on SCr of 1.08 mg/dL (H)). Liver Function Tests: No results for input(s): "AST", "ALT", "ALKPHOS", "BILITOT", "PROT", "ALBUMIN" in the last 168 hours. No results for input(s): "  LIPASE", "AMYLASE" in the last 168 hours. No results for input(s): "AMMONIA" in the last 168 hours. Coagulation Profile: No results for input(s): "INR", "PROTIME" in the last 168 hours. Cardiac Enzymes: No results for input(s): "CKTOTAL", "CKMB", "CKMBINDEX", "TROPONINI" in the last 168 hours. BNP (last 3 results) No results for input(s): "PROBNP" in the last 8760  hours. HbA1C: Recent Labs    08/12/22 1814  HGBA1C 6.4*   CBG: Recent Labs  Lab 08/13/22 1032 08/13/22 1715 08/13/22 2137 08/14/22 0609 08/14/22 1032  GLUCAP 112* 142* 136* 134* 124*   Lipid Profile: No results for input(s): "CHOL", "HDL", "LDLCALC", "TRIG", "CHOLHDL", "LDLDIRECT" in the last 72 hours. Thyroid Function Tests: Recent Labs    08/12/22 2343  TSH 1.835   Anemia Panel: No results for input(s): "VITAMINB12", "FOLATE", "FERRITIN", "TIBC", "IRON", "RETICCTPCT" in the last 72 hours. Sepsis Labs: No results for input(s): "PROCALCITON", "LATICACIDVEN" in the last 168 hours.  No results found for this or any previous visit (from the past 240 hour(s)).       Radiology Studies: CT Head Wo Contrast  Result Date: 08/12/2022 CLINICAL DATA:  Fall from standing height EXAM: CT HEAD WITHOUT CONTRAST CT MAXILLOFACIAL WITHOUT CONTRAST CT CERVICAL SPINE WITHOUT CONTRAST TECHNIQUE: Multidetector CT imaging of the head, cervical spine, and maxillofacial structures were performed using the standard protocol without intravenous contrast. Multiplanar CT image reconstructions of the cervical spine and maxillofacial structures were also generated. RADIATION DOSE REDUCTION: This exam was performed according to the departmental dose-optimization program which includes automated exposure control, adjustment of the mA and/or kV according to patient size and/or use of iterative reconstruction technique. COMPARISON:  None Available. FINDINGS: CT HEAD FINDINGS Brain: No evidence of acute infarction, hemorrhage, hydrocephalus, extra-axial collection or mass lesion/mass effect. Vascular: No hyperdense vessel or unexpected calcification. CT FACIAL BONES FINDINGS Skull: Normal. Negative for fracture or focal lesion. Facial bones: No displaced fractures or dislocations. Sinuses/Orbits: No acute finding. Other: Soft tissue laceration of the forehead and bridge of the nose. CT CERVICAL SPINE FINDINGS  Alignment: Normal. Skull base and vertebrae: No acute fracture. No primary bone lesion or focal pathologic process. Soft tissues and spinal canal: No prevertebral fluid or swelling. No visible canal hematoma. Disc levels: Moderate multilevel cervical disc space height loss and osteophytosis. Upper chest: Negative. Other: None. IMPRESSION: 1. No acute intracranial pathology. 2. No displaced fractures or dislocations of the facial bones. 3. Soft tissue laceration of the forehead and bridge of the nose. 4. No fracture or static subluxation of the cervical spine. 5. Moderate multilevel cervical disc degenerative disease. Electronically Signed   By: Delanna Ahmadi M.D.   On: 08/12/2022 20:47   CT Cervical Spine Wo Contrast  Result Date: 08/12/2022 CLINICAL DATA:  Fall from standing height EXAM: CT HEAD WITHOUT CONTRAST CT MAXILLOFACIAL WITHOUT CONTRAST CT CERVICAL SPINE WITHOUT CONTRAST TECHNIQUE: Multidetector CT imaging of the head, cervical spine, and maxillofacial structures were performed using the standard protocol without intravenous contrast. Multiplanar CT image reconstructions of the cervical spine and maxillofacial structures were also generated. RADIATION DOSE REDUCTION: This exam was performed according to the departmental dose-optimization program which includes automated exposure control, adjustment of the mA and/or kV according to patient size and/or use of iterative reconstruction technique. COMPARISON:  None Available. FINDINGS: CT HEAD FINDINGS Brain: No evidence of acute infarction, hemorrhage, hydrocephalus, extra-axial collection or mass lesion/mass effect. Vascular: No hyperdense vessel or unexpected calcification. CT FACIAL BONES FINDINGS Skull: Normal. Negative for fracture or focal lesion. Facial bones: No  displaced fractures or dislocations. Sinuses/Orbits: No acute finding. Other: Soft tissue laceration of the forehead and bridge of the nose. CT CERVICAL SPINE FINDINGS Alignment: Normal.  Skull base and vertebrae: No acute fracture. No primary bone lesion or focal pathologic process. Soft tissues and spinal canal: No prevertebral fluid or swelling. No visible canal hematoma. Disc levels: Moderate multilevel cervical disc space height loss and osteophytosis. Upper chest: Negative. Other: None. IMPRESSION: 1. No acute intracranial pathology. 2. No displaced fractures or dislocations of the facial bones. 3. Soft tissue laceration of the forehead and bridge of the nose. 4. No fracture or static subluxation of the cervical spine. 5. Moderate multilevel cervical disc degenerative disease. Electronically Signed   By: Delanna Ahmadi M.D.   On: 08/12/2022 20:47   CT Maxillofacial Wo Contrast  Result Date: 08/12/2022 CLINICAL DATA:  Fall from standing height EXAM: CT HEAD WITHOUT CONTRAST CT MAXILLOFACIAL WITHOUT CONTRAST CT CERVICAL SPINE WITHOUT CONTRAST TECHNIQUE: Multidetector CT imaging of the head, cervical spine, and maxillofacial structures were performed using the standard protocol without intravenous contrast. Multiplanar CT image reconstructions of the cervical spine and maxillofacial structures were also generated. RADIATION DOSE REDUCTION: This exam was performed according to the departmental dose-optimization program which includes automated exposure control, adjustment of the mA and/or kV according to patient size and/or use of iterative reconstruction technique. COMPARISON:  None Available. FINDINGS: CT HEAD FINDINGS Brain: No evidence of acute infarction, hemorrhage, hydrocephalus, extra-axial collection or mass lesion/mass effect. Vascular: No hyperdense vessel or unexpected calcification. CT FACIAL BONES FINDINGS Skull: Normal. Negative for fracture or focal lesion. Facial bones: No displaced fractures or dislocations. Sinuses/Orbits: No acute finding. Other: Soft tissue laceration of the forehead and bridge of the nose. CT CERVICAL SPINE FINDINGS Alignment: Normal. Skull base and  vertebrae: No acute fracture. No primary bone lesion or focal pathologic process. Soft tissues and spinal canal: No prevertebral fluid or swelling. No visible canal hematoma. Disc levels: Moderate multilevel cervical disc space height loss and osteophytosis. Upper chest: Negative. Other: None. IMPRESSION: 1. No acute intracranial pathology. 2. No displaced fractures or dislocations of the facial bones. 3. Soft tissue laceration of the forehead and bridge of the nose. 4. No fracture or static subluxation of the cervical spine. 5. Moderate multilevel cervical disc degenerative disease. Electronically Signed   By: Delanna Ahmadi M.D.   On: 08/12/2022 20:47   DG Chest Port 1 View  Result Date: 08/12/2022 CLINICAL DATA:  Recent syncopal episode EXAM: PORTABLE CHEST 1 VIEW COMPARISON:  11/17/2021 FINDINGS: Cardiac shadow is within normal limits. Aortic calcifications are noted. Lungs are clear. Chronic blunting of left costophrenic angle is again noted. No bony abnormality is seen. IMPRESSION: No acute abnormality noted. Chronic blunting of left costophrenic angle is noted. Electronically Signed   By: Inez Catalina M.D.   On: 08/12/2022 18:56        Scheduled Meds:  atorvastatin  80 mg Oral Daily   bisoprolol  5 mg Oral Daily   citalopram  20 mg Oral QPM   ezetimibe  10 mg Oral Daily   heparin  5,000 Units Subcutaneous Q8H   hydrOXYzine  25 mg Oral QHS   insulin aspart  0-15 Units Subcutaneous TID WC   insulin aspart  0-5 Units Subcutaneous QHS   Continuous Infusions:        Aline August, MD [facility] 08/14/2022, 12:35 PM

## 2022-08-14 NOTE — Consult Note (Signed)
Cardiology Consultation   Patient ID: Alexandra Berger MRN: [id number]; DOB: 03-21-1951  Admit date: 08/12/2022 Date of Consult: 08/14/2022  PCP:  Faustino Congress, NP   [facility] Providers Cardiologist:  Quay Burow, MD   {   Patient Profile:   Alexandra Berger is a 72 y.o. 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 Percival Spanish.  History of Present Illness:   [name] initially referred to Dr. [name] Nov 2022 after a syncopal event, wore a monitor as part of her evaluation then which revealed sinus rhythm, sinus bradycardia, sinus tachycardia, frequent PVCs with short runs of NSVT and runs of SVT. W/u also included coronary calcium score was 158 with majority in LAD, 78 percentile, dilated main pulmonary artery to 35 mm suggest pulmonary hypertension  Her benazepril dose reduced, referred to EP for evaluation of PVCs/management recommendations though never seen.  Subsequently/incidentally with 2.5 cm left lower lobe pulmonary nodule cardiac  refer to pulmonology, PET scan on 07/06/2021 revealed 2.4 cm well circumscribed pulmonary nodule in the anterior left lower lobe with mild hypermetabolism suspicious for pulmonary neuroendocrine tumor versus low-grade carcinoma.   No evidence of metastatic disease.   She was seen by Dr. [name] and bronchoscopy on 08/29/2021 that showed neuroendocrine carcinoma.   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 There was no evidence for visceral pleural or lymphovascular invasion.  The sampled lymph nodes were negative for malignancy.  Dr Julien Nordmann, she was informed that there is  curable prognosis with surgical resection and CT chest in 1 year was recommended for surveillance.    She was admitted now with syncope., by notes upon standing became dizzy/lightheaded, spinning and had syncope, suffered facial trauma. Grandsone helped her up, still feeling "foggy". Daughter reported that she shad 2 further fainting spells while seated in bed the pt was unaware of  CT head showed no acute ICH. CT C spine showed no acute fracture. CT facial showed soft tissue laceration of the forehead and bridge of the nose, no acute fracture  LABS K+ 3.8 BUN/Creat 11/10/1 HS trop 4, 5 WBC 4.7 H/H 14/42 Plts 186 TSH 1.835  She tells me that is is common for her to get a little lightheaded on standing, but this time was worse and progressed to fainting in seconds. She had finished urinating, finished up in the BP as she was exiting, felt lightheaded and quickly fainted. No associated symptoms pre-post of any CP, palpitations or cardiac awareness No SOB  She has felt well of late, no unusual symptoms, no CP, palpitations, SOB  She cannot recall specifics of her fainting episode a year or so ago. She does not recall the 2 faints she had that her daughter reported after she had gotten out in bed after this event  Past Medical History:  Diagnosis Date   Anxiety and depression    Chronic back pain    Diabetes mellitus without complication (HCC)    DJD (degenerative joint disease)    Frequent PVCs    GERD (gastroesophageal reflux disease)    Hyperlipidemia    Hypertension    Memory difficulties    Syncope     Past Surgical History:  Procedure Laterality Date  ABDOMINAL HYSTERECTOMY     BRONCHIAL BIOPSY  08/29/2021   Procedure: BRONCHIAL BIOPSIES;  Surgeon: Garner Nash, DO;  Location: [facility];  Service: Pulmonary;;   BRONCHIAL BRUSHINGS  08/29/2021   Procedure: BRONCHIAL BRUSHINGS;  Surgeon: Garner Nash, DO;  Location: [facility];  Service: Pulmonary;;   BRONCHIAL  NEEDLE ASPIRATION BIOPSY  08/29/2021   Procedure: BRONCHIAL NEEDLE ASPIRATION BIOPSIES;  Surgeon: Garner Nash, DO;  Location: [facility];  Service: Pulmonary;;   CARDIAC CATHETERIZATION     2014   DILATION AND CURETTAGE OF UTERUS     EYE SURGERY Left    X 2   INTERCOSTAL NERVE BLOCK Left 09/28/2021   Procedure: INTERCOSTAL NERVE BLOCK;  Surgeon: Lajuana Matte, MD;  Location: [facility];  Service: Thoracic;  Laterality: Left;   LYMPH NODE DISSECTION Left 09/28/2021   Procedure: LYMPH NODE DISSECTION;  Surgeon: Lajuana Matte, MD;  Location: [facility];  Service: Thoracic;  Laterality: Left;   THORACENTESIS Left 11/17/2021   Procedure: Mathews Robinsons;  Surgeon: Garner Nash, DO;  Location: [facility];  Service: Pulmonary;  Laterality: Left;   VIDEO BRONCHOSCOPY WITH RADIAL ENDOBRONCHIAL ULTRASOUND  08/29/2021   Procedure: RADIAL ENDOBRONCHIAL ULTRASOUND;  Surgeon: Garner Nash, DO;  Location: [facility];  Service: Pulmonary;;     Home Medications:  Prior to Admission medications   Medication Sig Start Date End Date Taking? Authorizing Provider  atorvastatin (LIPITOR) 80 MG tablet Take 1 tablet (80 mg total) by mouth daily. 08/22/21  Yes [name], MD  benazepril (LOTENSIN) 10 MG tablet Take 5 mg by mouth every evening.   Yes [provider]  citalopram (CELEXA) 20 MG tablet Take 40 mg by mouth every evening. 03/05/14  Yes [provider]  cycloSPORINE (RESTASIS) 0.05 % ophthalmic emulsion Place 1 drop into both eyes 2 (two) times daily.    Yes [provider]  ergocalciferol (VITAMIN D2) 1.25 MG (50000 UT) capsule Take 50,000 Units by mouth every Sunday.   Yes [provider]  ezetimibe (ZETIA) 10 MG tablet Take 1 tablet (10 mg total) by mouth daily. 12/15/21 10/11/22 Yes Cleaver, Jossie Ng, NP  famotidine (PEPCID) 20 MG tablet Take 20 mg by mouth at bedtime.   Yes [provider]  gabapentin (NEURONTIN) 300 MG capsule Take 300 mg by  mouth daily as needed (for neuropathy).   Yes [provider]  hydrOXYzine (ATARAX) 25 MG tablet Take 25 mg by mouth at bedtime. 09/26/21  Yes [provider]  insulin detemir (LEVEMIR) 100 UNIT/ML injection Inject 10-24 Units into the skin See admin instructions. Inject 24 units subcutaneously in the morning & inject 10 units subcutaneously at night.   Yes [provider]  memantine (NAMENDA) 5 MG tablet Take 10 mg by mouth daily.   Yes [provider]  Zinc Oxide 10 % OINT Apply 1 Application topically daily as needed (for hands).   Yes [provider]  Blood Glucose Monitoring Suppl (ONE TOUCH ULTRA 2) w/Device KIT SMARTSIG:1 Each Via Meter As Directed 10/24/21   [provider]  Blood Glucose Monitoring Suppl (ONE TOUCH ULTRA 2) w/Device KIT USE TO TEST YOUR BLOOD SUGAR FINGER STICK ONCE A DAY    [provider]  Lancets (ONETOUCH DELICA PLUS [id number]) MISC Apply 1 each topically daily. 10/16/21   [provider]  Donald Siva test strip [place] Meter 10/01/21   [provider]    Inpatient Medications: Scheduled Meds:  atorvastatin  80  mg Oral Daily   bisoprolol  5 mg Oral Daily   citalopram  20 mg Oral QPM   ezetimibe  10 mg Oral Daily   heparin  5,000 Units Subcutaneous Q8H   hydrOXYzine  25 mg Oral QHS   insulin aspart  0-15 Units Subcutaneous TID WC   insulin aspart  0-5 Units Subcutaneous QHS   Continuous Infusions:  PRN Meds: acetaminophen **OR** acetaminophen, diclofenac Sodium, melatonin, ondansetron **OR** ondansetron (ZOFRAN) IV  Allergies:    Allergies  Allergen Reactions   Sulfa Antibiotics Anaphylaxis and Shortness Of Breath   Pravastatin Other (See Comments)    dizziness   Statins Other (See Comments)    dizziness    Social History:   Social History   Socioeconomic History   Marital status: Widowed    Spouse name: Not on file   Number of children: Not on file   Years of  education: Not on file   Highest education level: Not on file  Occupational History   Occupation: retired    Comment: Airline pilot  Tobacco Use   Smoking status: Former    Packs/day: 0.50    Years: 7.00    Total pack years: 3.50    Types: Cigarettes    Quit date: 02/23/2002    Years since quitting: 20.4   Smokeless tobacco: Never  Vaping Use   Vaping Use: Never used  Substance and Sexual Activity   Alcohol use: Yes    Alcohol/week: 14.0 standard drinks of alcohol    Types: 14 Glasses of wine per week    Comment: wine occassionally   Drug use: No   Sexual activity: Not on file  Other Topics Concern   Not on file  Social History Narrative   Not on file   Social Determinants of Health   Financial Resource Strain: Not on file  Food Insecurity: Not on file  Transportation Needs: Not on file  Physical Activity: Not on file  Stress: Not on file  Social Connections: Not on file  Intimate Partner Violence: Not on file    Family History:    Family History  Problem Relation Age of Onset   CAD Mother    CAD Father    Heart attack Father    Colon cancer Brother 56     ROS:  Please see the history of present illness.  All other ROS reviewed and negative.     Physical Exam/Data:   Vitals:   08/14/22 0811 08/14/22 0853 08/14/22 0857 08/14/22 1028  BP: (!) 107/50 93/66 105/68 117/62  Pulse: 74 71 69 66  Resp: 18   16  Temp: 98.7 F (37.1 C)   97.9 F (36.6 C)  TempSrc: Oral   Oral  SpO2: 97%   95%  Weight:      Height:        Intake/Output Summary (Last 24 hours) at 08/14/2022 1135 Last data filed at 08/14/2022 0809 Gross per 24 hour  Intake 420 ml  Output --  Net 420 ml      08/14/2022    5:00 AM 08/13/2022    9:15 AM 07/08/2022    8:56 PM  Last 3 Weights  Weight (lbs) 191 lb 160 lb 182 lb 15.7 oz  Weight (kg) 86.637 kg 72.576 kg 83 kg     Body mass index is 30.83 kg/m.  General:  Well nourished, well developed, in no acute distress HEENT: significant  bruising/ecchymosis b/l eyes, face/forehead Neck: no JVD Vascular: No carotid bruits; Distal  pulses 2+ bilaterally Cardiac:  RRR; no murmurs, gallops or rubs Lungs:  CTA b/l, no wheezing, rhonchi or rales  Abd: soft, nontender Ext: no edema Musculoskeletal:  No deformities Skin: warm and dry  Neuro:  no focal abnormalities noted Psych:  Normal affect   EKG:  The EKG was personally reviewed and demonstrates:    SR 84bpm, PVCs, normal interals SR 73bpm, PVCs  EKGs borderline lowvoltage  Telemetry:  Telemetry was personally reviewed and demonstrates:   SR 80's, PVC burden waxes/wanes, has brief periods of bigeminy No VT, no bradycardia, pauses, or heart block  Relevant CV Studies:   09/11/2021: TTE 1. Left ventricular ejection fraction, by estimation, is 60 to 65%. The  left ventricle has normal function. The left ventricle has no regional  wall motion abnormalities. Left ventricular diastolic parameters were  normal.   2. Right ventricular systolic function is normal. The right ventricular  size is normal.   3. A small pericardial effusion is present. The pericardial effusion is  circumferential. There is no evidence of cardiac tamponade.   4. The mitral valve is normal in structure. Trivial mitral valve  regurgitation. No evidence of mitral stenosis.   5. The aortic valve is tricuspid. Aortic valve regurgitation is not  visualized. No aortic stenosis is present.   6. The inferior vena cava is normal in size with greater than 50%  respiratory variability, suggesting right atrial pressure of 3 mmHg.   Comparison(s): No prior Echocardiogram.    06/12/21: Coronary Ca++ IMPRESSION: 1. Coronary calcium score of 158. This was 78th percentile for age-, race-, and sex-matched controls. 2. Dilated main pulmonary artery to 35 mm, suggestive of pulmonary hypertension. 3. Aortic atherosclerosis. 4. Aortic and mitral annular calcification.   Dec-Jan 2023, monitor 1.  NSR/SB/ST 2. Freq PVCs and short runs of NSVT 3. Runs of SVT 4. Needs ROV to discuss  In review of report PVC burden was 3%  Laboratory Data:  High Sensitivity Troponin:   Recent Labs  Lab 08/12/22 2010 08/12/22 2343  TROPONINIHS 4 5     Chemistry Recent Labs  Lab 08/12/22 1814 08/14/22 0047  NA 138 139  K 3.8 3.9  CL 100 104  CO2 29 26  GLUCOSE 137* 126*  BUN 11 15  CREATININE 1.01* 1.08*  CALCIUM 9.0 8.6*  GFRNONAA 60* 55*  ANIONGAP 9 9    No results for input(s): "PROT", "ALBUMIN", "AST", "ALT", "ALKPHOS", "BILITOT" in the last 168 hours. Lipids No results for input(s): "CHOL", "TRIG", "HDL", "LABVLDL", "LDLCALC", "CHOLHDL" in the last 168 hours.  Hematology Recent Labs  Lab 08/12/22 1814  WBC 4.7  RBC 4.63  HGB 14.1  HCT 42.9  MCV 92.7  MCH 30.5  MCHC 32.9  RDW 13.2  PLT 186   Thyroid  Recent Labs  Lab 08/12/22 2343  TSH 1.835    BNPNo results for input(s): "BNP", "PROBNP" in the last 168 hours.  DDimer No results for input(s): "DDIMER" in the last 168 hours.   Radiology/Studies:  CT Head Wo Contrast Result Date: 08/12/2022 CLINICAL DATA:  Fall from standing height EXAM: CT HEAD WITHOUT CONTRAST CT MAXILLOFACIAL WITHOUT CONTRAST CT CERVICAL SPINE WITHOUT CONTRAST TECHNIQUE: Multidetector CT imaging of the head, cervical spine, and maxillofacial structures were performed using the standard protocol without intravenous contrast. Multiplanar CT image reconstructions of the cervical spine and maxillofacial structures were also generated. RADIATION DOSE REDUCTION: This exam was performed according to the departmental dose-optimization program which includes automated exposure control, adjustment of  the mA and/or kV according to patient size and/or use of iterative reconstruction technique. COMPARISON:  None Available. FINDINGS: CT HEAD FINDINGS Brain: No evidence of acute infarction, hemorrhage, hydrocephalus, extra-axial collection or mass lesion/mass  effect. Vascular: No hyperdense vessel or unexpected calcification. CT FACIAL BONES FINDINGS Skull: Normal. Negative for fracture or focal lesion. Facial bones: No displaced fractures or dislocations. Sinuses/Orbits: No acute finding. Other: Soft tissue laceration of the forehead and bridge of the nose. CT CERVICAL SPINE FINDINGS Alignment: Normal. Skull base and vertebrae: No acute fracture. No primary bone lesion or focal pathologic process. Soft tissues and spinal canal: No prevertebral fluid or swelling. No visible canal hematoma. Disc levels: Moderate multilevel cervical disc space height loss and osteophytosis. Upper chest: Negative. Other: None. IMPRESSION: 1. No acute intracranial pathology. 2. No displaced fractures or dislocations of the facial bones. 3. Soft tissue laceration of the forehead and bridge of the nose. 4. No fracture or static subluxation of the cervical spine. 5. Moderate multilevel cervical disc degenerative disease. Electronically Signed   By: Delanna Ahmadi M.D.   On: 08/12/2022 20:47    DG Chest Port 1 View Result Date: 08/12/2022 CLINICAL DATA:  Recent syncopal episode EXAM: PORTABLE CHEST 1 VIEW COMPARISON:  11/17/2021 FINDINGS: Cardiac shadow is within normal limits. Aortic calcifications are noted. Lungs are clear. Chronic blunting of left costophrenic angle is again noted. No bony abnormality is seen. IMPRESSION: No acute abnormality noted. Chronic blunting of left costophrenic angle is noted. Electronically Signed   By: Inez Catalina M.D.   On: 08/12/2022 18:56     Assessment and Plan:   Recurrent syncope Long history of orthostatic symptoms No orthostatic vitals here or w/EMS EMS tracing SR 70's, with some PVCs Initial BP recorded by EMS was  170/100 > 110/63 > 116/61 > 121/68 (all seated)  She describes known orthostatic dizziness. This event though was different in severity. She had 2 further faints reported by chart/family after she was helped/back in  bed Advised to hydrate better Given recurrent syncope, reasonable to consider loop, discussed with her implant procedure, potential risks/benefits is agreeable  I have advised her not to drive for 6 months or until etiology is found/corrected given further syncope occurred seated. She was in complete agreement  Dr. Quentin Ore will see her  Risk Assessment/Risk Scores:    For questions or updates, please contact [facility] Please consult [email or web address] for contact info under    Signed, Baldwin Jamaica, PA-C  08/14/2022 11:35 AM

## 2022-08-14 NOTE — Progress Notes (Signed)
Progress Note  Patient Name: Alexandra Berger Date of Encounter: 08/14/2022  Primary Cardiologist:   Quay Burow, MD   Subjective   No further syncope.  Feels well.  No pain.    Inpatient Medications    Scheduled Meds:  atorvastatin  80 mg Oral Daily   bisoprolol  5 mg Oral Daily   citalopram  20 mg Oral QPM   ezetimibe  10 mg Oral Daily   heparin  5,000 Units Subcutaneous Q8H   hydrOXYzine  25 mg Oral QHS   insulin aspart  0-15 Units Subcutaneous TID WC   insulin aspart  0-5 Units Subcutaneous QHS   Continuous Infusions:  PRN Meds: acetaminophen **OR** acetaminophen, diclofenac Sodium, melatonin, ondansetron **OR** ondansetron (ZOFRAN) IV   Vital Signs    Vitals:   08/13/22 1950 08/13/22 2350 08/14/22 0408 08/14/22 0500  BP: 90/65 (!) 93/55 (!) 99/59   Pulse: 77 73 (!) 55   Resp: 18 16 18    Temp: 98.3 F (36.8 C) 98.8 F (37.1 C) 97.7 F (36.5 C)   TempSrc: Oral Oral Oral   SpO2: 97% 96% 100%   Weight:    86.6 kg  Height:        Intake/Output Summary (Last 24 hours) at 08/14/2022 0656 Last data filed at 08/13/2022 1755 Gross per 24 hour  Intake 420 ml  Output --  Net 420 ml   Filed Weights   08/13/22 0915 08/14/22 0500  Weight: 72.6 kg 86.6 kg    Telemetry    NSR, PVCs - Personally Reviewed  ECG    NA - Personally Reviewed  Physical Exam   GEN: No acute distress.   Neck: No  JVD Cardiac: RRR, no murmurs, rubs, or gallops.  Respiratory: Clear  to auscultation bilaterally. GI: Soft, nontender, non-distended  MS: No  edema; No deformity. Neuro:  Nonfocal  Psych: Normal affect   Labs    Chemistry Recent Labs  Lab 08/12/22 1814 08/14/22 0047  NA 138 139  K 3.8 3.9  CL 100 104  CO2 29 26  GLUCOSE 137* 126*  BUN 11 15  CREATININE 1.01* 1.08*  CALCIUM 9.0 8.6*  GFRNONAA 60* 55*  ANIONGAP 9 9     Hematology Recent Labs  Lab 08/12/22 1814  WBC 4.7  RBC 4.63  HGB 14.1  HCT 42.9  MCV 92.7  MCH 30.5  MCHC 32.9   RDW 13.2  PLT 186    Cardiac EnzymesNo results for input(s): "TROPONINI" in the last 168 hours. No results for input(s): "TROPIPOC" in the last 168 hours.   BNPNo results for input(s): "BNP", "PROBNP" in the last 168 hours.   DDimer No results for input(s): "DDIMER" in the last 168 hours.   Radiology    CT Head Wo Contrast  Result Date: 08/12/2022 CLINICAL DATA:  Fall from standing height EXAM: CT HEAD WITHOUT CONTRAST CT MAXILLOFACIAL WITHOUT CONTRAST CT CERVICAL SPINE WITHOUT CONTRAST TECHNIQUE: Multidetector CT imaging of the head, cervical spine, and maxillofacial structures were performed using the standard protocol without intravenous contrast. Multiplanar CT image reconstructions of the cervical spine and maxillofacial structures were also generated. RADIATION DOSE REDUCTION: This exam was performed according to the departmental dose-optimization program which includes automated exposure control, adjustment of the mA and/or kV according to patient size and/or use of iterative reconstruction technique. COMPARISON:  None Available. FINDINGS: CT HEAD FINDINGS Brain: No evidence of acute infarction, hemorrhage, hydrocephalus, extra-axial collection or mass lesion/mass effect. Vascular: No hyperdense vessel or unexpected  calcification. CT FACIAL BONES FINDINGS Skull: Normal. Negative for fracture or focal lesion. Facial bones: No displaced fractures or dislocations. Sinuses/Orbits: No acute finding. Other: Soft tissue laceration of the forehead and bridge of the nose. CT CERVICAL SPINE FINDINGS Alignment: Normal. Skull base and vertebrae: No acute fracture. No primary bone lesion or focal pathologic process. Soft tissues and spinal canal: No prevertebral fluid or swelling. No visible canal hematoma. Disc levels: Moderate multilevel cervical disc space height loss and osteophytosis. Upper chest: Negative. Other: None. IMPRESSION: 1. No acute intracranial pathology. 2. No displaced fractures or  dislocations of the facial bones. 3. Soft tissue laceration of the forehead and bridge of the nose. 4. No fracture or static subluxation of the cervical spine. 5. Moderate multilevel cervical disc degenerative disease. Electronically Signed   By: Delanna Ahmadi M.D.   On: 08/12/2022 20:47   CT Cervical Spine Wo Contrast  Result Date: 08/12/2022 CLINICAL DATA:  Fall from standing height EXAM: CT HEAD WITHOUT CONTRAST CT MAXILLOFACIAL WITHOUT CONTRAST CT CERVICAL SPINE WITHOUT CONTRAST TECHNIQUE: Multidetector CT imaging of the head, cervical spine, and maxillofacial structures were performed using the standard protocol without intravenous contrast. Multiplanar CT image reconstructions of the cervical spine and maxillofacial structures were also generated. RADIATION DOSE REDUCTION: This exam was performed according to the departmental dose-optimization program which includes automated exposure control, adjustment of the mA and/or kV according to patient size and/or use of iterative reconstruction technique. COMPARISON:  None Available. FINDINGS: CT HEAD FINDINGS Brain: No evidence of acute infarction, hemorrhage, hydrocephalus, extra-axial collection or mass lesion/mass effect. Vascular: No hyperdense vessel or unexpected calcification. CT FACIAL BONES FINDINGS Skull: Normal. Negative for fracture or focal lesion. Facial bones: No displaced fractures or dislocations. Sinuses/Orbits: No acute finding. Other: Soft tissue laceration of the forehead and bridge of the nose. CT CERVICAL SPINE FINDINGS Alignment: Normal. Skull base and vertebrae: No acute fracture. No primary bone lesion or focal pathologic process. Soft tissues and spinal canal: No prevertebral fluid or swelling. No visible canal hematoma. Disc levels: Moderate multilevel cervical disc space height loss and osteophytosis. Upper chest: Negative. Other: None. IMPRESSION: 1. No acute intracranial pathology. 2. No displaced fractures or dislocations of the  facial bones. 3. Soft tissue laceration of the forehead and bridge of the nose. 4. No fracture or static subluxation of the cervical spine. 5. Moderate multilevel cervical disc degenerative disease. Electronically Signed   By: Delanna Ahmadi M.D.   On: 08/12/2022 20:47   CT Maxillofacial Wo Contrast  Result Date: 08/12/2022 CLINICAL DATA:  Fall from standing height EXAM: CT HEAD WITHOUT CONTRAST CT MAXILLOFACIAL WITHOUT CONTRAST CT CERVICAL SPINE WITHOUT CONTRAST TECHNIQUE: Multidetector CT imaging of the head, cervical spine, and maxillofacial structures were performed using the standard protocol without intravenous contrast. Multiplanar CT image reconstructions of the cervical spine and maxillofacial structures were also generated. RADIATION DOSE REDUCTION: This exam was performed according to the departmental dose-optimization program which includes automated exposure control, adjustment of the mA and/or kV according to patient size and/or use of iterative reconstruction technique. COMPARISON:  None Available. FINDINGS: CT HEAD FINDINGS Brain: No evidence of acute infarction, hemorrhage, hydrocephalus, extra-axial collection or mass lesion/mass effect. Vascular: No hyperdense vessel or unexpected calcification. CT FACIAL BONES FINDINGS Skull: Normal. Negative for fracture or focal lesion. Facial bones: No displaced fractures or dislocations. Sinuses/Orbits: No acute finding. Other: Soft tissue laceration of the forehead and bridge of the nose. CT CERVICAL SPINE FINDINGS Alignment: Normal. Skull base and vertebrae: No acute fracture.  No primary bone lesion or focal pathologic process. Soft tissues and spinal canal: No prevertebral fluid or swelling. No visible canal hematoma. Disc levels: Moderate multilevel cervical disc space height loss and osteophytosis. Upper chest: Negative. Other: None. IMPRESSION: 1. No acute intracranial pathology. 2. No displaced fractures or dislocations of the facial bones. 3. Soft  tissue laceration of the forehead and bridge of the nose. 4. No fracture or static subluxation of the cervical spine. 5. Moderate multilevel cervical disc degenerative disease. Electronically Signed   By: Delanna Ahmadi M.D.   On: 08/12/2022 20:47   DG Chest Port 1 View  Result Date: 08/12/2022 CLINICAL DATA:  Recent syncopal episode EXAM: PORTABLE CHEST 1 VIEW COMPARISON:  11/17/2021 FINDINGS: Cardiac shadow is within normal limits. Aortic calcifications are noted. Lungs are clear. Chronic blunting of left costophrenic angle is again noted. No bony abnormality is seen. IMPRESSION: No acute abnormality noted. Chronic blunting of left costophrenic angle is noted. Electronically Signed   By: Inez Catalina M.D.   On: 08/12/2022 18:56    Cardiac Studies   Echo pending  Patient Profile     72 y.o. female is a 72 y.o. 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   Assessment & Plan    Syncope I am concerned that this is cardiac but I see no arrhythmias.  She is going to get an implanted loop.    HTN Orthostatic hypotension.  Discussed precautions.   Discontinued ACE inhibitor.    PVCs OK to continue beta blocker   HLD  Continue statin.    Neuroendocrine lung cancer s/p LLL Type 2 DM  Anxiety with depression    For questions or updates, please contact [facility] Please consult [email or web address] for contact info under Cardiology/STEMI.   Signed, Minus Breeding, MD  08/14/2022, 6:56 AM

## 2022-08-14 NOTE — Evaluation (Signed)
Physical Therapy Evaluation and Discharge Patient Details Name: Naiyana Barbian MRN: [id number] DOB: 12/10/1950 Today's Date: 08/14/2022  History of Present Illness  Pt is a 72 y.o. F who presents 08/12/2022 for syncopal episodes at home. Cardiology recommending loop replacement. Significant PMH: DM, diabetic neuropathy, hx of neuroendocrine lung cancer s/p left lower lobe lobectomy.  Clinical Impression  Patient evaluated by Physical Therapy with no further acute PT needs identified. Pt reports no further syncopal episodes since hospitalization. On PT evaluation, pt ambulating 80 ft with no assistive device or physical assist. HR 75, BP 100/54. Pt with mild balance deficits and decreased endurance, which is likely close to her baseline. Education provided regarding activity recommendations and progression in addition to energy conservation techniques. All education has been completed and the patient has no further questions. No follow-up Physical Therapy or equipment needs. PT is signing off. Thank you for this referral.      Recommendations for follow up therapy are one component of a multi-disciplinary discharge planning process, led by the attending physician.  Recommendations may be updated based on patient status, additional functional criteria and insurance authorization.  Follow Up Recommendations No PT follow up      Assistance Recommended at Discharge Intermittent Supervision/Assistance  Patient can return home with the following  Assistance with cooking/housework;Assist for transportation;Help with stairs or ramp for entrance    Equipment Recommendations None recommended by PT  Recommendations for Other Services       Functional Status Assessment Patient has had a recent decline in their functional status and demonstrates the ability to make significant improvements in function in a reasonable and predictable amount of time.     Precautions / Restrictions  Precautions Precautions: Fall Restrictions Weight Bearing Restrictions: No      Mobility  Bed Mobility Overal bed mobility: Modified Independent                  Transfers Overall transfer level: Independent Equipment used: None                    Ambulation/Gait Ambulation/Gait assistance: Supervision Gait Distance (Feet): 80 Feet Assistive device: None Gait Pattern/deviations: Step-through pattern, Decreased stride length Gait velocity: decreased for age     General Gait Details: Mildly antalgic (pt reporting chronic back pain), no overt LOB, supervision for safety  Stairs            Wheelchair Mobility    Modified Rankin (Stroke Patients Only)       Balance Overall balance assessment: Mild deficits observed, not formally tested                                           Pertinent Vitals/Pain Pain Assessment Pain Assessment: Faces Faces Pain Scale: Hurts a little bit Pain Location: chronic back pain Pain Descriptors / Indicators: Sore Pain Intervention(s): Monitored during session    Home Living Family/patient expects to be discharged to:: Private residence Living Arrangements: Children Available Help at Discharge: Family Type of Home: House Home Access: Stairs to enter   Technical brewer of Steps: 4   Home Layout: One level Home Equipment: Shower seat      Prior Function Prior Level of Function : Independent/Modified Independent                     Hand Dominance  Extremity/Trunk Assessment   Upper Extremity Assessment Upper Extremity Assessment: Overall WFL for tasks assessed    Lower Extremity Assessment Lower Extremity Assessment: Overall WFL for tasks assessed       Communication   Communication: No difficulties  Cognition Arousal/Alertness: Awake/alert Behavior During Therapy: WFL for tasks assessed/performed Overall Cognitive Status: Within Functional Limits for tasks  assessed                                          General Comments      Exercises     Assessment/Plan    PT Assessment Patient does not need any further PT services  PT Problem List         PT Treatment Interventions      PT Goals (Current goals can be found in the Care Plan section)  Acute Rehab PT Goals Patient Stated Goal: to return to independence PT Goal Formulation: All assessment and education complete, DC therapy    Frequency       Co-evaluation               AM-PAC PT "6 Clicks" Mobility  Outcome Measure Help needed turning from your back to your side while in a flat bed without using bedrails?: None Help needed moving from lying on your back to sitting on the side of a flat bed without using bedrails?: None Help needed moving to and from a bed to a chair (including a wheelchair)?: None Help needed standing up from a chair using your arms (e.g., wheelchair or bedside chair)?: None Help needed to walk in hospital room?: A Little Help needed climbing 3-5 steps with a railing? : A Little 6 Click Score: 22    End of Session   Activity Tolerance: Patient tolerated treatment well Patient left: in bed;with call bell/phone within reach;with family/visitor present   PT Visit Diagnosis: Unsteadiness on feet (R26.81);History of falling (Z91.81)    Time: 9833-8250 PT Time Calculation (min) (ACUTE ONLY): 17 min   Charges:   PT Evaluation $PT Eval Low Complexity: [place], PT, DPT Acute Rehabilitation Services Office [phone]   Deno Etienne 08/14/2022, 4:58 PM

## 2022-08-14 NOTE — Progress Notes (Signed)
RN went over discharge instructions with patient and patient's daughter at bedside. RN educated both patient and patient's daughter about post surgical site care. Patient able to perform verbal teach back of post surgical site care. RN went over medication changes and follow up appointment. Patient and patient's daughter verbalize understanding. PIV and tele monitor removed by NT. RN notified CCMD of discharge. Patient has no further questions for RN to answer. Patient's daughter will transport patient home.

## 2022-08-14 NOTE — Discharge Instructions (Signed)
Post procedure wound care instructions Keep incision clean and dry for 3 days. You can remove outer dressing tomorrow. Leave steri-strips (little pieces of tape) on until seen in the office for wound check appointment. Call the office ([phone]) for redness, drainage, swelling, or fever.  

## 2022-08-14 NOTE — TOC Progression Note (Signed)
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 Plan for a loop recorder.  TOC following.         Expected Discharge Plan and Services                                               Social Determinants of Health (SDOH) Interventions SDOH Screenings   Tobacco Use: Medium Risk (08/13/2022)    Readmission Risk Interventions     No data to display

## 2022-08-14 NOTE — Progress Notes (Signed)
*  PRELIMINARY RESULTS* Echocardiogram 2D Echocardiogram has been performed.  Elpidio Anis 08/14/2022, 12:23 PM

## 2022-08-15 ENCOUNTER — Telehealth: Payer: Self-pay

## 2022-08-15 NOTE — Telephone Encounter (Signed)
-----   Message from [facility], [place] sent at 08/14/2022  3:03 PM EST ----- Loop today  Syncope  CL  [place]

## 2022-08-15 NOTE — Discharge Summary (Signed)
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  Cardiology was consulted. Cardiology recommended loop replacement. EP consulted.  She underwent loop recorder placement on 08/14/2022 and subsequently cardiology/EP cleared the patient for discharge.  She was discharged on 08/14/2022.  Outpatient follow-up with PCP and cardiology/EP.  Discharge Diagnoses:   Syncope -Presented with syncopal episodes at home.  Mostly cardiac in nature. -Cardiology recommended loop replacement. EP consulted.  She underwent loop recorder placement on 08/14/2022 and subsequently cardiology/EP cleared the patient for discharge.  She was discharged on 08/14/2022.  Outpatient follow-up with PCP and cardiology/EP. -PT recommended no PT follow-up.  No further syncope since admission   Hypertension Orthostatic hypotension -Blood pressure still intermittently on the lower side.  ACE inhibitor has been discontinued.  Currently on bisoprolol as per cardiology.  Continue bisoprolol on discharge.   History of PVCs -Continue bisoprolol as per cardiology   Facial trauma after fall -CT of the face showed no  fractures.   Hyperlipidemia -Continue statin and ezetimibe   Memory impairment -Memantine on hold as this might cause hypotension/dizziness.  Outpatient follow-up with PCP/neurology   Diabetes mellitus type 2  -A1c 6.4.  Outpatient follow-up.  Carb modified diet.  Long-acting insulin on hold till reevaluation by PCP because of low blood sugars currently.  History of neuroendocrine lung cancer status post left lower lobe lobectomy -Outpatient follow-up with oncology   Obesity -Outpatient follow-up  Discharge Instructions  Discharge Instructions     Ambulatory referral to Cardiology   Complete by: As directed    Diet - low sodium heart healthy   Complete by: As directed    Diet Carb Modified   Complete by: As directed    Increase activity slowly   Complete by: As directed       Allergies as of 08/14/2022       Reactions   Sulfa Antibiotics Anaphylaxis, Shortness Of Breath   Pravastatin Other (See Comments)   dizziness   Statins Other (See Comments)   dizziness        Medication List     STOP taking these medications    benazepril 10 MG tablet Commonly known as: LOTENSIN   insulin detemir 100 UNIT/ML injection Commonly known as: LEVEMIR   memantine 5 MG tablet Commonly known as: NAMENDA       TAKE these medications    atorvastatin 80 MG tablet Commonly known as: LIPITOR Take 1 tablet (80 mg total) by mouth daily.   bisoprolol 5 MG tablet Commonly known as: ZEBETA Take 1 tablet (5 mg total) by mouth daily.   citalopram 20 MG tablet Commonly known as: CELEXA Take 40 mg by mouth every evening.   cycloSPORINE 0.05 % ophthalmic emulsion Commonly known as: RESTASIS Place 1 drop into both  eyes 2 (two) times daily.   ergocalciferol 1.25 MG (50000 UT) capsule Commonly known as: VITAMIN D2 Take 50,000 Units by mouth every Sunday.   ezetimibe 10 MG tablet Commonly known as: ZETIA Take 1 tablet (10 mg total) by mouth daily.   famotidine 20 MG  tablet Commonly known as: PEPCID Take 20 mg by mouth at bedtime.   gabapentin 300 MG capsule Commonly known as: NEURONTIN Take 300 mg by mouth daily as needed (for neuropathy).   hydrOXYzine 25 MG tablet Commonly known as: ATARAX Take 25 mg by mouth at bedtime.   ONE TOUCH ULTRA 2 w/Device Kit USE TO TEST YOUR BLOOD SUGAR FINGER STICK ONCE A DAY   ONE TOUCH ULTRA 2 w/Device Kit SMARTSIG:1 Each Via Meter As Directed   OneTouch Delica Plus Lancet33G Misc Apply 1 each topically daily.   OneTouch Ultra test strip Generic drug: glucose blood SMARTSIG:Via Meter   Zinc Oxide 10 % Oint Apply 1 Application topically daily as needed (for hands).        Follow-up Information     Cleaver, Jesse M, NP Follow up on 08/31/2022.   Specialty: Cardiology Why: Appointment at 8:25 AM Contact information: [address] [place] [address] [phone]         Matthews, Stephanie, NP. Schedule an appointment as soon as possible for a visit in 1 week(s).   Specialty: Family Medicine Contact information: [address] [place] [address] [phone]                Allergies  Allergen Reactions   Sulfa Antibiotics Anaphylaxis and Shortness Of Breath   Pravastatin Other (See Comments)    dizziness   Statins Other (See Comments)    dizziness    Consultations: Cardiology/EP   Procedures/Studies: EP PPM/ICD IMPLANT  Result Date: 08/14/2022 CONCLUSIONS:  1. Successful implantation of a implantable loop recorder for Syncope  2. No early apparent complications.   ECHOCARDIOGRAM COMPLETE  Result Date: 08/14/2022    ECHOCARDIOGRAM REPORT   Patient Name:   Margaurite SWANSON-BAGLEY Date of Exam: 08/14/2022 Medical Rec #:  [id number]            Height:       66.0 in Accession #:    [id number]           Weight:       191.0 lb Date of Birth:  06/24/1951            BSA:          1.961 m Patient Age:    72 years             BP:           107/50 mmHg Patient  Gender: F                    HR:           74  bpm. Exam Location:  Inpatient Procedure: 2D Echo, Cardiac Doppler and Color Doppler Indications:    Syncope  History:        Patient has prior history of Echocardiogram examinations, most                 recent 09/11/2021. CAD, Signs/Symptoms:Syncope; Risk                 Factors:Hypertension, Diabetes, Dyslipidemia and Former Smoker.  Sonographer:    Wenda Low Referring Phys: [id number] Margie Billet IMPRESSIONS  1. Left ventricular ejection fraction,  by estimation, is 60 to 65%. The left ventricle has normal function. The left ventricle has no regional wall motion abnormalities. Left ventricular diastolic parameters are indeterminate. Elevated left atrial pressure.  2. The mitral valve is abnormal. Moderate mitral valve regurgitation.  3. Tricuspid valve regurgitation is moderate.  4. Right ventricular systolic function is normal. The right ventricular size is normal. There is normal pulmonary artery systolic pressure.  5. Moderate pericardial effusion. The pericardial effusion is anterior to the right ventricle and localized near the right atrium. No evidence of increased pericardial pressure  6. The aortic valve is tricuspid. Aortic valve regurgitation is not visualized. Comparison(s): Prior images reviewed side by side. Compared to prior, mitral regurgitation and tricuspid regurgitation have increased. Pericardial effusion is similar but predominantly RV anterior in nature. FINDINGS  Left Ventricle: Left ventricular ejection fraction, by estimation, is 60 to 65%. The left ventricle has normal function. The left ventricle has no regional wall motion abnormalities. The left ventricular internal cavity size was normal in size. There is  no left ventricular hypertrophy. Left ventricular diastolic parameters are indeterminate. Elevated left atrial pressure. Right Ventricle: The right ventricular size is normal. No increase in right ventricular wall thickness. Right  ventricular systolic function is normal. There is normal pulmonary artery systolic pressure. The tricuspid regurgitant velocity is 2.59 m/s, and  with an assumed right atrial pressure of 3 mmHg, the estimated right ventricular systolic pressure is 67.1 mmHg. Left Atrium: Left atrial size was normal in size. Right Atrium: Right atrial size was normal in size. Pericardium: A moderately sized pericardial effusion is present. The pericardial effusion is anterior to the right ventricle and localized near the right atrium. Mitral Valve: The mitral valve is abnormal. Mild mitral annular calcification. Moderate mitral valve regurgitation. MV peak gradient, 4.2 mmHg. The mean mitral valve gradient is 1.0 mmHg. Tricuspid Valve: The tricuspid valve is normal in structure. Tricuspid valve regurgitation is moderate. Aortic Valve: The aortic valve is tricuspid. Aortic valve regurgitation is not visualized. Aortic valve mean gradient measures 3.0 mmHg. Aortic valve peak gradient measures 6.4 mmHg. Aortic valve area, by VTI measures 2.54 cm. Pulmonic Valve: The pulmonic valve was grossly normal. Pulmonic valve regurgitation is mild. Aorta: The aortic root is normal in size and structure. IAS/Shunts: The interatrial septum was not well visualized.  LEFT VENTRICLE PLAX 2D LVIDd:         4.40 cm   Diastology LVIDs:         3.00 cm   LV e' medial:    5.26 cm/s LV PW:         1.00 cm   LV E/e' medial:  19.4 LV IVS:        1.00 cm   LV e' lateral:   7.71 cm/s LVOT diam:     1.90 cm   LV E/e' lateral: 13.2 LV SV:         69 LV SV Index:   35 LVOT Area:     2.84 cm  RIGHT VENTRICLE RV Basal diam:  3.30 cm RV Mid diam:    2.80 cm RV S prime:     10.30 cm/s TAPSE (M-mode): 2.8 cm LEFT ATRIUM             Index        RIGHT ATRIUM           Index LA diam:        3.60 cm 1.84 cm/m   RA Area:  15.50 cm LA Vol (A2C):   50.6 ml 25.80 ml/m  RA Volume:   41.30 ml  21.06 ml/m LA Vol (A4C):   48.0 ml 24.48 ml/m LA Biplane Vol: 50.2 ml 25.60  ml/m  AORTIC VALVE                    PULMONIC VALVE AV Area (Vmax):    2.45 cm     PV Vmax:       0.77 m/s AV Area (Vmean):   2.42 cm     PV Peak grad:  2.3 mmHg AV Area (VTI):     2.54 cm AV Vmax:           126.00 cm/s AV Vmean:          86.700 cm/s AV VTI:            0.271 m AV Peak Grad:      6.4 mmHg AV Mean Grad:      3.0 mmHg LVOT Vmax:         109.00 cm/s LVOT Vmean:        74.000 cm/s LVOT VTI:          0.243 m LVOT/AV VTI ratio: 0.90  AORTA Ao Root diam: 3.10 cm MITRAL VALVE                TRICUSPID VALVE MV Area (PHT): 4.15 cm     TR Peak grad:   26.8 mmHg MV Area VTI:   2.40 cm     TR Vmax:        259.00 cm/s MV Peak grad:  4.2 mmHg MV Mean grad:  1.0 mmHg     SHUNTS MV Vmax:       1.02 m/s     Systemic VTI:  0.24 m MV Vmean:      44.6 cm/s    Systemic Diam: 1.90 cm MV Decel Time: 183 msec MV E velocity: 102.00 cm/s MV A velocity: 41.90 cm/s MV E/A ratio:  2.43 Rudean Haskell MD Electronically signed by Rudean Haskell MD Signature Date/Time: 08/14/2022/1:37:32 PM    Final    CT Head Wo Contrast  Result Date: 08/12/2022 CLINICAL DATA:  Fall from standing height EXAM: CT HEAD WITHOUT CONTRAST CT MAXILLOFACIAL WITHOUT CONTRAST CT CERVICAL SPINE WITHOUT CONTRAST TECHNIQUE: Multidetector CT imaging of the head, cervical spine, and maxillofacial structures were performed using the standard protocol without intravenous contrast. Multiplanar CT image reconstructions of the cervical spine and maxillofacial structures were also generated. RADIATION DOSE REDUCTION: This exam was performed according to the departmental dose-optimization program which includes automated exposure control, adjustment of the mA and/or kV according to patient size and/or use of iterative reconstruction technique. COMPARISON:  None Available. FINDINGS: CT HEAD FINDINGS Brain: No evidence of acute infarction, hemorrhage, hydrocephalus, extra-axial collection or mass lesion/mass effect. Vascular: No hyperdense vessel or  unexpected calcification. CT FACIAL BONES FINDINGS Skull: Normal. Negative for fracture or focal lesion. Facial bones: No displaced fractures or dislocations. Sinuses/Orbits: No acute finding. Other: Soft tissue laceration of the forehead and bridge of the nose. CT CERVICAL SPINE FINDINGS Alignment: Normal. Skull base and vertebrae: No acute fracture. No primary bone lesion or focal pathologic process. Soft tissues and spinal canal: No prevertebral fluid or swelling. No visible canal hematoma. Disc levels: Moderate multilevel cervical disc space height loss and osteophytosis. Upper chest: Negative. Other: None. IMPRESSION: 1. No acute intracranial pathology. 2. No displaced fractures or dislocations of the facial bones. 3. Soft  tissue laceration of the forehead and bridge of the nose. 4. No fracture or static subluxation of the cervical spine. 5. Moderate multilevel cervical disc degenerative disease. Electronically Signed   By: Delanna Ahmadi M.D.   On: 08/12/2022 20:47   CT Cervical Spine Wo Contrast  Result Date: 08/12/2022 CLINICAL DATA:  Fall from standing height EXAM: CT HEAD WITHOUT CONTRAST CT MAXILLOFACIAL WITHOUT CONTRAST CT CERVICAL SPINE WITHOUT CONTRAST TECHNIQUE: Multidetector CT imaging of the head, cervical spine, and maxillofacial structures were performed using the standard protocol without intravenous contrast. Multiplanar CT image reconstructions of the cervical spine and maxillofacial structures were also generated. RADIATION DOSE REDUCTION: This exam was performed according to the departmental dose-optimization program which includes automated exposure control, adjustment of the mA and/or kV according to patient size and/or use of iterative reconstruction technique. COMPARISON:  None Available. FINDINGS: CT HEAD FINDINGS Brain: No evidence of acute infarction, hemorrhage, hydrocephalus, extra-axial collection or mass lesion/mass effect. Vascular: No hyperdense vessel or unexpected  calcification. CT FACIAL BONES FINDINGS Skull: Normal. Negative for fracture or focal lesion. Facial bones: No displaced fractures or dislocations. Sinuses/Orbits: No acute finding. Other: Soft tissue laceration of the forehead and bridge of the nose. CT CERVICAL SPINE FINDINGS Alignment: Normal. Skull base and vertebrae: No acute fracture. No primary bone lesion or focal pathologic process. Soft tissues and spinal canal: No prevertebral fluid or swelling. No visible canal hematoma. Disc levels: Moderate multilevel cervical disc space height loss and osteophytosis. Upper chest: Negative. Other: None. IMPRESSION: 1. No acute intracranial pathology. 2. No displaced fractures or dislocations of the facial bones. 3. Soft tissue laceration of the forehead and bridge of the nose. 4. No fracture or static subluxation of the cervical spine. 5. Moderate multilevel cervical disc degenerative disease. Electronically Signed   By: Delanna Ahmadi M.D.   On: 08/12/2022 20:47   CT Maxillofacial Wo Contrast  Result Date: 08/12/2022 CLINICAL DATA:  Fall from standing height EXAM: CT HEAD WITHOUT CONTRAST CT MAXILLOFACIAL WITHOUT CONTRAST CT CERVICAL SPINE WITHOUT CONTRAST TECHNIQUE: Multidetector CT imaging of the head, cervical spine, and maxillofacial structures were performed using the standard protocol without intravenous contrast. Multiplanar CT image reconstructions of the cervical spine and maxillofacial structures were also generated. RADIATION DOSE REDUCTION: This exam was performed according to the departmental dose-optimization program which includes automated exposure control, adjustment of the mA and/or kV according to patient size and/or use of iterative reconstruction technique. COMPARISON:  None Available. FINDINGS: CT HEAD FINDINGS Brain: No evidence of acute infarction, hemorrhage, hydrocephalus, extra-axial collection or mass lesion/mass effect. Vascular: No hyperdense vessel or unexpected calcification. CT  FACIAL BONES FINDINGS Skull: Normal. Negative for fracture or focal lesion. Facial bones: No displaced fractures or dislocations. Sinuses/Orbits: No acute finding. Other: Soft tissue laceration of the forehead and bridge of the nose. CT CERVICAL SPINE FINDINGS Alignment: Normal. Skull base and vertebrae: No acute fracture. No primary bone lesion or focal pathologic process. Soft tissues and spinal canal: No prevertebral fluid or swelling. No visible canal hematoma. Disc levels: Moderate multilevel cervical disc space height loss and osteophytosis. Upper chest: Negative. Other: None. IMPRESSION: 1. No acute intracranial pathology. 2. No displaced fractures or dislocations of the facial bones. 3. Soft tissue laceration of the forehead and bridge of the nose. 4. No fracture or static subluxation of the cervical spine. 5. Moderate multilevel cervical disc degenerative disease. Electronically Signed   By: Delanna Ahmadi M.D.   On: 08/12/2022 20:47   DG Chest [facility] [address]  Result Date: 08/12/2022 CLINICAL DATA:  Recent syncopal episode EXAM: PORTABLE CHEST 1 VIEW COMPARISON:  11/17/2021 FINDINGS: Cardiac shadow is within normal limits. Aortic calcifications are noted. Lungs are clear. Chronic blunting of left costophrenic angle is again noted. No bony abnormality is seen. IMPRESSION: No acute abnormality noted. Chronic blunting of left costophrenic angle is noted. Electronically Signed   By: Inez Catalina M.D.   On: 08/12/2022 18:56      Subjective: Patient seen and examined at bedside. Denies any fever, vomiting, chest pain or worsening shortness breath.   Discharge Exam: Vitals:   08/14/22 1659 08/14/22 1737  BP: (!) 98/55 116/67  Pulse: 68   Resp: 16   Temp:    SpO2: 98%     General exam: Appears calm and comfortable.  Currently on room air.  Bruising below eyes.   Respiratory system: Bilateral decreased breath sounds at bases, no wheezing Cardiovascular system: S1 & S2 heard, Rate  controlled Gastrointestinal system: Abdomen is obese, nondistended, soft and nontender. Normal bowel sounds heard. Extremities: No cyanosis, clubbing; trace lower extremity edema    The results of significant diagnostics from this hospitalization (including imaging, microbiology, ancillary and laboratory) are listed below for reference.     Microbiology: No results found for this or any previous visit (from the past 240 hour(s)).   Labs: BNP (last 3 results) No results for input(s): "BNP" in the last 8760 hours. Basic Metabolic Panel: Recent Labs  Lab 08/12/22 1814 08/14/22 0047  NA 138 139  K 3.8 3.9  CL 100 104  CO2 29 26  GLUCOSE 137* 126*  BUN 11 15  CREATININE 1.01* 1.08*  CALCIUM 9.0 8.6*   Liver Function Tests: No results for input(s): "AST", "ALT", "ALKPHOS", "BILITOT", "PROT", "ALBUMIN" in the last 168 hours. No results for input(s): "LIPASE", "AMYLASE" in the last 168 hours. No results for input(s): "AMMONIA" in the last 168 hours. CBC: Recent Labs  Lab 08/12/22 1814  WBC 4.7  HGB 14.1  HCT 42.9  MCV 92.7  PLT 186   Cardiac Enzymes: No results for input(s): "CKTOTAL", "CKMB", "CKMBINDEX", "TROPONINI" in the last 168 hours. BNP: Invalid input(s): "POCBNP" CBG: Recent Labs  Lab 08/13/22 1715 08/13/22 2137 08/14/22 0609 08/14/22 1032 08/14/22 1643  GLUCAP 142* 136* 134* 124* 109*   D-Dimer No results for input(s): "DDIMER" in the last 72 hours. Hgb A1c Recent Labs    08/12/22 1814  HGBA1C 6.4*   Lipid Profile No results for input(s): "CHOL", "HDL", "LDLCALC", "TRIG", "CHOLHDL", "LDLDIRECT" in the last 72 hours. Thyroid function studies Recent Labs    08/12/22 2343  TSH 1.835   Anemia work up No results for input(s): "VITAMINB12", "FOLATE", "FERRITIN", "TIBC", "IRON", "RETICCTPCT" in the last 72 hours. Urinalysis    Component Value Date/Time   COLORURINE AMBER (A) 09/27/2021 1432   APPEARANCEUR CLOUDY (A) 09/27/2021 1432   LABSPEC  1.025 09/27/2021 1432   PHURINE 7.0 09/27/2021 1432   GLUCOSEU NEGATIVE 09/27/2021 1432   HGBUR NEGATIVE 09/27/2021 1432   BILIRUBINUR NEGATIVE 09/27/2021 1432   KETONESUR NEGATIVE 09/27/2021 1432   PROTEINUR 30 (A) 09/27/2021 1432   NITRITE NEGATIVE 09/27/2021 1432   LEUKOCYTESUR LARGE (A) 09/27/2021 1432   Sepsis Labs Recent Labs  Lab 08/12/22 1814  WBC 4.7   Microbiology No results found for this or any previous visit (from the past 240 hour(s)).   Time coordinating discharge: 35 minutes  SIGNED:   Aline August, MD  [facility] 08/15/2022, 10:53 AM

## 2022-08-16 ENCOUNTER — Encounter (HOSPITAL_COMMUNITY): Payer: Self-pay | Admitting: Cardiology

## 2022-08-16 NOTE — Telephone Encounter (Signed)
Loop Recorder Follow up   Is patient connected to [employer]? [employer]  Have steri-strips fallen off or been removed?  Spoke with Pt's daughter.  She states steri-strips have fallen off and site is oozing a little.  Advised that this needs to be assessed.  She states she can bring Pt to office tomorrow.  She will send a mychart message with time she can come.    Does the patient need in office follow up? Yes   Please continue to monitor your cardiac device site for redness, swelling, and drainage. Call the device clinic at [phone] if you experience these symptoms, fever/chills, or have questions about your device.   Remote monitoring is used to monitor your cardiac device from home. This monitoring is scheduled every month by our office. It allows [place] to keep an eye on the functioning of your device to ensure it is working properly.

## 2022-08-17 NOTE — Telephone Encounter (Signed)
Spoke with patients daughter she stated that patient told her that site was not oozing anymore and that looked like it was healing, advised patients daughter to continue to monitor site and if it starts to drain anymore, redness, swelling fever of chills to call us she stated she would.

## 2022-08-24 DIAGNOSIS — E1122 Type 2 diabetes mellitus with diabetic chronic kidney disease: Secondary | ICD-10-CM | POA: Diagnosis not present

## 2022-08-24 DIAGNOSIS — F3341 Major depressive disorder, recurrent, in partial remission: Secondary | ICD-10-CM | POA: Diagnosis not present

## 2022-08-24 DIAGNOSIS — K2100 Gastro-esophageal reflux disease with esophagitis, without bleeding: Secondary | ICD-10-CM | POA: Diagnosis not present

## 2022-08-24 DIAGNOSIS — S0993XD Unspecified injury of face, subsequent encounter: Secondary | ICD-10-CM | POA: Diagnosis not present

## 2022-08-24 DIAGNOSIS — E7800 Pure hypercholesterolemia, unspecified: Secondary | ICD-10-CM | POA: Diagnosis not present

## 2022-08-24 DIAGNOSIS — E1169 Type 2 diabetes mellitus with other specified complication: Secondary | ICD-10-CM | POA: Diagnosis not present

## 2022-08-24 DIAGNOSIS — R296 Repeated falls: Secondary | ICD-10-CM | POA: Diagnosis not present

## 2022-08-24 DIAGNOSIS — G3184 Mild cognitive impairment, so stated: Secondary | ICD-10-CM | POA: Diagnosis not present

## 2022-08-24 DIAGNOSIS — I493 Ventricular premature depolarization: Secondary | ICD-10-CM | POA: Diagnosis not present

## 2022-08-24 DIAGNOSIS — N1831 Chronic kidney disease, stage 3a: Secondary | ICD-10-CM | POA: Diagnosis not present

## 2022-08-24 DIAGNOSIS — R69 Illness, unspecified: Secondary | ICD-10-CM | POA: Diagnosis not present

## 2022-08-24 DIAGNOSIS — E1142 Type 2 diabetes mellitus with diabetic polyneuropathy: Secondary | ICD-10-CM | POA: Diagnosis not present

## 2022-08-24 DIAGNOSIS — I10 Essential (primary) hypertension: Secondary | ICD-10-CM | POA: Diagnosis not present

## 2022-08-24 NOTE — Progress Notes (Deleted)
Cardiology Clinic Note   Patient Name: Alexandra Berger Date of Encounter: 08/24/2022  Primary Care Provider:  Faustino Congress, NP Primary Cardiologist:  Quay Burow, MD  Patient Profile    Alexandra Berger 72 year old female presents the clinic today for follow-up evaluation of her essential hypertension and hyperlipidemia.  Past Medical History    Past Medical History:  Diagnosis Date   Anxiety and depression    Chronic back pain    Diabetes mellitus without complication ([place])    DJD (degenerative joint disease)    Frequent PVCs    GERD (gastroesophageal reflux disease)    Hyperlipidemia    Hypertension    Memory difficulties    Syncope    Past Surgical History:  Procedure Laterality Date   ABDOMINAL HYSTERECTOMY     BRONCHIAL BIOPSY  08/29/2021   Procedure: BRONCHIAL BIOPSIES;  Surgeon: Garner Nash, DO;  Location: [facility];  Service: Pulmonary;;   BRONCHIAL BRUSHINGS  08/29/2021   Procedure: BRONCHIAL BRUSHINGS;  Surgeon: Garner Nash, DO;  Location: [facility];  Service: Pulmonary;;   BRONCHIAL NEEDLE ASPIRATION BIOPSY  08/29/2021   Procedure: BRONCHIAL NEEDLE ASPIRATION BIOPSIES;  Surgeon: Garner Nash, DO;  Location: [facility];  Service: Pulmonary;;   CARDIAC CATHETERIZATION     2014   DILATION AND CURETTAGE OF UTERUS     EYE SURGERY Left    X 2   INTERCOSTAL NERVE BLOCK Left 09/28/2021   Procedure: INTERCOSTAL NERVE BLOCK;  Surgeon: Lajuana Matte, MD;  Location: [facility];  Service: Thoracic;  Laterality: Left;   LOOP RECORDER INSERTION N/A 08/14/2022   Procedure: LOOP RECORDER INSERTION;  Surgeon: Vickie Epley, MD;  Location: [facility];  Service: Cardiovascular;  Laterality: N/A;   LYMPH NODE DISSECTION Left 09/28/2021   Procedure: LYMPH NODE DISSECTION;  Surgeon: Lajuana Matte, MD;  Location: [facility];  Service: Thoracic;  Laterality: Left;   THORACENTESIS Left 11/17/2021   Procedure: [facility] Robinsons;  Surgeon:  Garner Nash, DO;  Location: [facility];  Service: Pulmonary;  Laterality: Left;   VIDEO BRONCHOSCOPY WITH RADIAL ENDOBRONCHIAL ULTRASOUND  08/29/2021   Procedure: RADIAL ENDOBRONCHIAL ULTRASOUND;  Surgeon: Garner Nash, DO;  Location: [facility];  Service: Pulmonary;;    Allergies  Allergies  Allergen Reactions   Sulfa Antibiotics Anaphylaxis and Shortness Of Breath   Pravastatin Other (See Comments)    dizziness   Statins Other (See Comments)    dizziness    History of Present Illness    [name] has a PMH of HTN, HLD, syncope, collapse, elevated calcium score, PVCs, type 2 diabetes and NSVT.  She was initially referred by her PCP for evaluation of her orthostatic hypotension.  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  She is a former smoker and quit 20 years ago.  Her parents both had CABG.  She was seen by Dr. [name].  During that time she denied chest pain and shortness of breath.  She had moved from [place] to [place] to be closer to family.  She was living with her son who has Down syndrome.  She reported being more active since moving.  She does not drive.  She reported an episode of syncope.  She was seen in follow-up on 08/22/2021 by Dr. [name].  Her cardiac event monitor showed frequent PVCs, runs of NSVT and also SVT.  Her coronary calcium score was 158 with calcium noted in the LAD.  She denied chest  pain.  Her lipid panel 03/22/2021 showed an LDL of 135.  She was compliant with atorvastatin.  She underwent thoracentesis on 11/17/2020 by Dr. [name].  She did note some shortness of breath with ambulation.  She is status post left lower lobectomy (carcinoid tumor).  Follow-up with Dr. [name] was planned for 1 month and follow-up chest x-ray.  She presents to the clinic today for follow-up evaluation states she feels well today.  She does note increased work of breathing with increased physical activity but has normal  breathing with normal activities.  She presents with her granddaughter who helps her with her history.  We reviewed her cardiac event monitor from February and she expressed understanding.  We reviewed her lipid panel and previous calcium scoring.  I will add ezetimibe 10 mg to her medication regimen, have her eat a high-fiber low-sodium diet and increase her physical activity as tolerated.  We will plan follow-up in 6 months.  Today she denies chest pain, shortness of breath, lower extremity edema, fatigue, palpitations, melena, hematuria, hemoptysis, diaphoresis, weakness, presyncope, syncope, orthopnea, and PND.  Essential hypertension-BP today 122/78.  Well-controlled at home. Continue benazepril 10 mg daily Heart healthy low-sodium diet-salty 6 given Increase physical activity as tolerated Maintain blood pressure log  Hyperlipidemia-[id number].  Previously referred to lipid clinic. Continue atorvastatin Heart healthy low-sodium high-fiber diet Increase physical activity as tolerated Start ezetimibe 10 mg daily  Syncope and collapse-no further episodes of lightheadedness dizziness presyncope or syncope.  Cardiac event monitor showed frequent PVCs, short runs of NSVT, and runs of SVT. Increase p.o. hydration Heart healthy low-sodium diet Increase physical activity as tolerated Lower extremity support stockings Follows with EP  Elevated coronary calcium-underwent coronary calcium score which was noted to be 158.  Plaque noted to be in LAD.  Continues to deny chest pain. Continue atorvastatin Heart healthy low-sodium diet-salty 6 given Increase physical activity as tolerated   Disposition: Follow-up with Dr. [name] or me in 4-6 Months   Home Medications    Prior to Admission medications   Medication Sig Start Date End Date Taking? Authorizing Provider  atorvastatin (LIPITOR) 80 MG tablet Take 1 tablet (80 mg total) by mouth daily. Patient taking differently: Take 80 mg by mouth every  evening. 08/22/21   [name], MD  benazepril (LOTENSIN) 10 MG tablet Take 5 mg by mouth every evening.    [provider]  Blood Glucose Monitoring Suppl (ONE TOUCH ULTRA 2) w/Device KIT SMARTSIG:1 Each Via Meter As Directed 10/24/21   [provider]  Blood Glucose Monitoring Suppl (ONE TOUCH ULTRA 2) w/Device KIT USE TO TEST YOUR BLOOD SUGAR FINGER STICK ONCE A DAY    [provider]  citalopram (CELEXA) 20 MG tablet Take 40 mg by mouth every evening. 03/05/14   [provider]  cycloSPORINE (RESTASIS) 0.05 % ophthalmic emulsion Place 1 drop into both eyes 2 (two) times daily.     [provider]  ergocalciferol (VITAMIN D2) 1.25 MG (50000 UT) capsule Take 50,000 Units by mouth every Sunday.    [provider]  gabapentin (NEURONTIN) 300 MG capsule Take 300 mg by mouth 3 (three) times daily.     [provider]  hydrOXYzine (ATARAX) 25 MG tablet Take 25 mg by mouth at bedtime as needed. 09/26/21   [provider]  insulin detemir (LEVEMIR) 100 UNIT/ML injection Inject 10-24 Units into the skin See admin instructions. Inject 24 units subcutaneously in the morning & inject 10 units subcutaneously at  night.    [provider]  Lancets (ONETOUCH DELICA PLUS 123XX123) MISC Apply 1 each topically daily. 10/16/21   [provider]  ONETOUCH ULTRA test strip SMARTSIG:Via Meter 10/01/21   [provider]  pantoprazole (PROTONIX) 40 MG tablet Take 40 mg by mouth every evening.    [provider]    Family History    Family History  Problem Relation Age of Onset   CAD Mother    CAD Father    Heart attack Father    Colon cancer Brother 108   She indicated that her mother is deceased. She indicated that her father is deceased. She indicated that her brother is deceased.  Social History    Social History   Socioeconomic History   Marital status: Widowed    Spouse name: Not on file   Number  of children: Not on file   Years of education: Not on file   Highest education level: Not on file  Occupational History   Occupation: retired    Comment: Airline pilot  Tobacco Use   Smoking status: Former    Packs/day: 0.50    Years: 7.00    Total pack years: 3.50    Types: Cigarettes    Quit date: 02/23/2002    Years since quitting: 20.5   Smokeless tobacco: Never  Vaping Use   Vaping Use: Never used  Substance and Sexual Activity   Alcohol use: Yes    Alcohol/week: 14.0 standard drinks of alcohol    Types: 14 Glasses of wine per week    Comment: wine occassionally   Drug use: No   Sexual activity: Not on file  Other Topics Concern   Not on file  Social History Narrative   Not on file   Social Determinants of Health   Financial Resource Strain: Not on file  Food Insecurity: Not on file  Transportation Needs: Not on file  Physical Activity: Not on file  Stress: Not on file  Social Connections: Not on file  Intimate Partner Violence: Not on file     Review of Systems    General:  No chills, fever, night sweats or weight changes.  Cardiovascular:  No chest pain, dyspnea on exertion, edema, orthopnea, palpitations, paroxysmal nocturnal dyspnea. Dermatological: No rash, lesions/masses Respiratory: No cough, dyspnea Urologic: No hematuria, dysuria Abdominal:   No nausea, vomiting, diarrhea, bright red blood per rectum, melena, or hematemesis Neurologic:  No visual changes, wkns, changes in mental status. All other systems reviewed and are otherwise negative except as noted above.  Physical Exam    VS:  There were no vitals taken for this visit. , BMI There is no height or weight on file to calculate BMI. GEN: Well nourished, well developed, in no acute distress. HEENT: normal. Neck: Supple, no JVD, carotid bruits, or masses. Cardiac: RRR, no murmurs, rubs, or gallops. No clubbing, cyanosis, edema.  Radials/DP/PT 2+ and equal bilaterally.  Respiratory:  Respirations regular  and unlabored, clear to auscultation bilaterally. GI: Soft, nontender, nondistended, BS + x 4. MS: no deformity or atrophy. Skin: warm and dry, no rash. Neuro:  Strength and sensation are intact. Psych: Normal affect.  Accessory Clinical Findings    Recent Labs: 07/08/2022: ALT 60 08/12/2022: Hemoglobin 14.1; Platelets 186; TSH 1.835 08/14/2022: BUN 15; Creatinine, Ser 1.08; Potassium 3.9; Sodium 139   Recent Lipid Panel No results found for: "CHOL", "TRIG", "HDL", "CHOLHDL", "VLDL", "LDLCALC", "LDLDIRECT"  ECG personally reviewed by me today-none today.  Echocardiogram 09/11/2021  IMPRESSIONS  1. Left ventricular ejection fraction, by estimation, is 60 to 65%. The  left ventricle has normal function. The left ventricle has no regional  wall motion abnormalities. Left ventricular diastolic parameters were  normal.   2. Right ventricular systolic function is normal. The right ventricular  size is normal.   3. A small pericardial effusion is present. The pericardial effusion is  circumferential. There is no evidence of cardiac tamponade.   4. The mitral valve is normal in structure. Trivial mitral valve  regurgitation. No evidence of mitral stenosis.   5. The aortic valve is tricuspid. Aortic valve regurgitation is not  visualized. No aortic stenosis is present.   6. The inferior vena cava is normal in size with greater than 50%  respiratory variability, suggesting right atrial pressure of 3 mmHg.   Comparison(s): No prior Echocardiogram.   Assessment & Plan   1.   ***  Jossie Ng. Millard Bautch NP-C    08/24/2022, 7:32 AM [address] Office [phone] Fax [phone]  Notice: This dictation was prepared with Dragon dictation along with smaller phrase technology. Any transcriptional errors that result from this process are unintentional and may not be corrected upon review.  I spent 14 ***minutes examining this patient,  reviewing medications, and using patient centered shared decision making involving her cardiac care.  Prior to her visit I spent greater than 20 minutes reviewing her past medical history,  medications, and prior cardiac tests.

## 2022-08-26 ENCOUNTER — Other Ambulatory Visit: Payer: Self-pay | Admitting: Cardiovascular Disease

## 2022-08-31 ENCOUNTER — Ambulatory Visit: Payer: Medicare HMO | Admitting: General Practice

## 2022-09-14 ENCOUNTER — Ambulatory Visit: Payer: Medicare HMO | Attending: Family Medicine

## 2022-09-14 DIAGNOSIS — R55 Syncope and collapse: Secondary | ICD-10-CM

## 2022-09-17 ENCOUNTER — Telehealth: Payer: Self-pay

## 2022-09-17 NOTE — Telephone Encounter (Signed)
Outreach made to Pt's daughter.  Per daughter Pt has denied any symptoms of dizziness or syncope.  She states alert from September 15, 2022 at 9:20 pm Pt was likely asleep.  Advised to call device clinic if Pt has any symptoms.  Will forward to Dr. Quentin Ore for review.  Presenting rhythm:

## 2022-09-17 NOTE — Telephone Encounter (Signed)
Alert received in [facility]:

## 2022-09-17 NOTE — Telephone Encounter (Signed)
Reviewed by Dr. Quentin Ore.  Continue to monitor.

## 2022-09-17 NOTE — Telephone Encounter (Signed)
Left detailed message requesting call back

## 2022-09-23 ENCOUNTER — Other Ambulatory Visit: Payer: Self-pay | Admitting: General Practice

## 2022-09-28 LAB — CUP PACEART REMOTE DEVICE CHECK
Date Time Interrogation Session: 20240322074731
Implantable Pulse Generator Implant Date: 20240220
Pulse Gen Model: 436066
Pulse Gen Serial Number: 95051469

## 2022-10-05 NOTE — Progress Notes (Signed)
Cardiology Clinic Note   Patient Name: Alexandra Berger Date of Encounter: 10/09/2022  Primary Care Provider:  Moshe Cipro, NP Primary Cardiologist:  Nanetta Batty, MD  Patient Profile    Alexandra Berger 72 year old female presents the clinic today for follow-up evaluation of her essential hypertension and hyperlipidemia.  Past Medical History    Past Medical History:  Diagnosis Date   Anxiety and depression    Chronic back pain    Diabetes mellitus without complication    DJD (degenerative joint disease)    Frequent PVCs    GERD (gastroesophageal reflux disease)    Hyperlipidemia    Hypertension    Memory difficulties    Syncope    Past Surgical History:  Procedure Laterality Date   ABDOMINAL HYSTERECTOMY     BRONCHIAL BIOPSY  08/29/2021   Procedure: BRONCHIAL BIOPSIES;  Surgeon: Josephine Igo, DO;  Location: [facility];  Service: Pulmonary;;   BRONCHIAL BRUSHINGS  08/29/2021   Procedure: BRONCHIAL BRUSHINGS;  Surgeon: Josephine Igo, DO;  Location: [facility];  Service: Pulmonary;;   BRONCHIAL NEEDLE ASPIRATION BIOPSY  08/29/2021   Procedure: BRONCHIAL NEEDLE ASPIRATION BIOPSIES;  Surgeon: Josephine Igo, DO;  Location: [facility];  Service: Pulmonary;;   CARDIAC CATHETERIZATION     2014   DILATION AND CURETTAGE OF UTERUS     EYE SURGERY Left    X 2   INTERCOSTAL NERVE BLOCK Left 09/28/2021   Procedure: INTERCOSTAL NERVE BLOCK;  Surgeon: Corliss Skains, MD;  Location: [facility];  Service: Thoracic;  Laterality: Left;   LOOP RECORDER INSERTION N/A 08/14/2022   Procedure: LOOP RECORDER INSERTION;  Surgeon: Lanier Prude, MD;  Location: [facility];  Service: Cardiovascular;  Laterality: N/A;   LYMPH NODE DISSECTION Left 09/28/2021   Procedure: LYMPH NODE DISSECTION;  Surgeon: Corliss Skains, MD;  Location: [facility];  Service: Thoracic;  Laterality: Left;   THORACENTESIS Left 11/17/2021   Procedure: Alanson Puls;  Surgeon: Josephine Igo, DO;  Location: [facility];  Service: Pulmonary;  Laterality: Left;   VIDEO BRONCHOSCOPY WITH RADIAL ENDOBRONCHIAL ULTRASOUND  08/29/2021   Procedure: RADIAL ENDOBRONCHIAL ULTRASOUND;  Surgeon: Josephine Igo, DO;  Location: [facility];  Service: Pulmonary;;    Allergies  Allergies  Allergen Reactions   Sulfa Antibiotics Anaphylaxis and Shortness Of Breath   Pravastatin Other (See Comments)    dizziness   Statins Other (See Comments)    dizziness    History of Present Illness    Alexandra Berger has a PMH of HTN, HLD, syncope, collapse, elevated calcium score, PVCs, type 2 diabetes and NSVT.  She was initially referred by her PCP for evaluation of her orthostatic hypotension.  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  She is a former smoker and quit 20 years ago.  Her parents both had CABG.  She was seen by Dr. Allyson Sabal.  During that time she denied chest pain and shortness of breath.  She had moved from [place] to [place] to be closer to family.  She was living with her son who has Down syndrome.  She reported being more active since moving.  She does not drive.  She reported an episode of syncope.  She was seen in follow-up on 08/22/2021 by Dr. Allyson Sabal.  Her cardiac event monitor showed frequent PVCs, runs of NSVT and also SVT.  Her coronary calcium score was 158 with calcium noted in the LAD.  She denied chest pain.  Her lipid panel 03/22/2021 showed an LDL of 135.  She was compliant with atorvastatin.  She underwent thoracentesis on 11/17/2020 by Dr. Cliffton Asters.  She did note some shortness of breath with ambulation.  She is status post left lower lobectomy (carcinoid tumor).  Follow-up with Dr. Tonia Brooms was planned for 1 month and follow-up chest x-ray.  She presented to the clinic 12/15/21 for follow-up evaluation stated she felt well.  She did note increased work of breathing with increased physical activity but had normal breathing with  normal activities.  She presented with her granddaughter who helped her with her history.  We reviewed her cardiac event monitor from February and she expressed understanding.  We reviewed her lipid panel and previous calcium scoring.  I  added ezetimibe 10 mg to her medication regimen.  We will plan follow-up in 6 months.  She was admitted to the hospital 08/13/2022 with syncopal episodes.  She had lost consciousness shortly after using the restroom.  She was seen and evaluated by EP.  Her telemetry showed sinus rhythm with PVCs.  The option for loop recorder implantation was reviewed.  She underwent loop recorder implantation 08/14/2022.  Device was interrogated 09/14/2022 and showed no arrhythmias and normal histogram.  She presents to the clinic today for follow-up evaluation and states she has been somewhat more active with the nicer weather.  She has been walking to get her mail which takes her about 20 minutes.  She presents with her daughter.  Her daughter reports that since having the loop recorder implanted she has had a few calls.  Her mom has been asymptomatic a majority of the time.  Alexandra Berger reports brief episodes of lightheadedness intermittently with standing.  She reports good hydration.  Her blood pressure today is 112/62.  We reviewed her loop recorder implantation.  She and her daughter expressed understanding.  I will continue her current medication regimen and plan follow-up in 6 months.  Today she denies chest pain, shortness of breath, lower extremity edema, fatigue, palpitations, melena, hematuria, hemoptysis, diaphoresis, weakness, presyncope, syncope, orthopnea, and PND.  Home Medications    Prior to Admission medications   Medication Sig Start Date End Date Taking? Authorizing Provider  atorvastatin (LIPITOR) 80 MG tablet Take 1 tablet (80 mg total) by mouth daily. Patient taking differently: Take 80 mg by mouth every evening. 08/22/21   Runell Gess, MD   benazepril (LOTENSIN) 10 MG tablet Take 5 mg by mouth every evening.    [provider]  Blood Glucose Monitoring Suppl (ONE TOUCH ULTRA 2) w/Device KIT SMARTSIG:1 Each Via Meter As Directed 10/24/21   [provider]  Blood Glucose Monitoring Suppl (ONE TOUCH ULTRA 2) w/Device KIT USE TO TEST YOUR BLOOD SUGAR FINGER STICK ONCE A DAY    [provider]  citalopram (CELEXA) 20 MG tablet Take 40 mg by mouth every evening. 03/05/14   [provider]  cycloSPORINE (RESTASIS) 0.05 % ophthalmic emulsion Place 1 drop into both eyes 2 (two) times daily.     [provider]  ergocalciferol (VITAMIN D2) 1.25 MG (50000 UT) capsule Take 50,000 Units by mouth every Sunday.    [provider]  gabapentin (NEURONTIN) 300 MG capsule Take 300 mg by mouth 3 (three) times daily.     [provider]  hydrOXYzine (ATARAX) 25 MG tablet Take 25 mg by mouth at bedtime as needed. 09/26/21   [provider]  insulin detemir (LEVEMIR) 100 UNIT/ML injection Inject 10-24 Units into  the skin See admin instructions. Inject 24 units subcutaneously in the morning & inject 10 units subcutaneously at night.    [provider]  Lancets [facility] DELICA PLUS [place]) MISC Apply 1 each topically daily. 10/16/21   [provider]  ONETOUCH ULTRA test strip SMARTSIG:Via Meter 10/01/21   [provider]  pantoprazole (PROTONIX) 40 MG tablet Take 40 mg by mouth every evening.    [provider]    Family History    Family History  Problem Relation Age of Onset   CAD Mother    CAD Father    Heart attack Father    Colon cancer Brother 59   She indicated that her mother is deceased. She indicated that her father is deceased. She indicated that her brother is deceased.  Social History    Social History   Socioeconomic History   Marital status: Widowed    Spouse name: Not on file   Number of children: Not on file   Years of  education: Not on file   Highest education level: Not on file  Occupational History   Occupation: retired    Comment: Community education officer  Tobacco Use   Smoking status: Former    Packs/day: 0.50    Years: 7.00    Additional pack years: 0.00    Total pack years: 3.50    Types: Cigarettes    Quit date: 02/23/2002    Years since quitting: 20.6   Smokeless tobacco: Never  Vaping Use   Vaping Use: Never used  Substance and Sexual Activity   Alcohol use: Yes    Alcohol/week: 14.0 standard drinks of alcohol    Types: 14 Glasses of wine per week    Comment: wine occassionally   Drug use: No   Sexual activity: Not on file  Other Topics Concern   Not on file  Social History Narrative   Not on file   Social Determinants of Health   Financial Resource Strain: Not on file  Food Insecurity: Not on file  Transportation Needs: Not on file  Physical Activity: Not on file  Stress: Not on file  Social Connections: Not on file  Intimate Partner Violence: Not on file     Review of Systems    General:  No chills, fever, night sweats or weight changes.  Cardiovascular:  No chest pain, dyspnea on exertion, edema, orthopnea, palpitations, paroxysmal nocturnal dyspnea. Dermatological: No rash, lesions/masses Respiratory: No cough, dyspnea Urologic: No hematuria, dysuria Abdominal:   No nausea, vomiting, diarrhea, bright red blood per rectum, melena, or hematemesis Neurologic:  No visual changes, wkns, changes in mental status. All other systems reviewed and are otherwise negative except as noted above.  Physical Exam    VS:  BP 112/62   Pulse 70   Ht 5\' 4"  (1.626 m)   Wt 186 lb (84.4 kg)   SpO2 99%   BMI 31.93 kg/m  , BMI Body mass index is 31.93 kg/m. GEN: Well nourished, well developed, in no acute distress. HEENT: normal. Neck: Supple, no JVD, carotid bruits, or masses. Cardiac: RRR, no murmurs, rubs, or gallops. No clubbing, cyanosis, edema.  Radials/DP/PT 2+ and equal bilaterally.   Respiratory:  Respirations regular and unlabored, clear to auscultation bilaterally. GI: Soft, nontender, nondistended, BS + x 4. MS: no deformity or atrophy. Skin: warm and dry, no rash. Neuro:  Strength and sensation are intact. Psych: Normal affect.  Accessory Clinical Findings    Recent Labs: 07/08/2022: ALT 60 08/12/2022: Hemoglobin 14.1; Platelets  186; TSH 1.835 08/14/2022: BUN 15; Creatinine, Ser 1.08; Potassium 3.9; Sodium 139   Recent Lipid Panel No results found for: "CHOL", "TRIG", "HDL", "CHOLHDL", "VLDL", "LDLCALC", "LDLDIRECT"  ECG personally reviewed by me today-none today.  Echocardiogram 09/11/2021  IMPRESSIONS     1. Left ventricular ejection fraction, by estimation, is 60 to 65%. The  left ventricle has normal function. The left ventricle has no regional  wall motion abnormalities. Left ventricular diastolic parameters were  normal.   2. Right ventricular systolic function is normal. The right ventricular  size is normal.   3. A small pericardial effusion is present. The pericardial effusion is  circumferential. There is no evidence of cardiac tamponade.   4. The mitral valve is normal in structure. Trivial mitral valve  regurgitation. No evidence of mitral stenosis.   5. The aortic valve is tricuspid. Aortic valve regurgitation is not  visualized. No aortic stenosis is present.   6. The inferior vena cava is normal in size with greater than 50%  respiratory variability, suggesting right atrial pressure of 3 mmHg.   Comparison(s): No prior Echocardiogram.   Assessment & Plan   1.Syncope and collapse-denies further episodes of presyncope or syncope since loop recorder implantation.  Does occasionally note brief lightheadedness with standing.  Device interrogated on 09/14/2022 and showed normal histograms with no arrhythmias.   Maintain p.o. hydration Lower extremity support stockings Follows with EP    Elevated coronary calcium-denies recent episodes of  chest discomfort.  Coronary calcium score which was noted to be 158.  Plaque noted to be in LAD.  Continues to deny chest pain. Continue atorvastatin Heart healthy low-sodium diet-salty 6 given Increase physical activity as tolerated   Essential hypertension-BP today 112/62.   Continue benazepril 10 mg daily Heart healthy low-sodium diet Increase physical activity as tolerated Maintain blood pressure log  Hyperlipidemia-LDL135 on 03/22/21  Previously referred to lipid clinic. Continue atorvastatin, ezetimibe Heart healthy low-sodium high-fiber diet Increase physical activity as tolerated Follows with pcp     Disposition: Follow-up with Dr. Allyson Sabal or me in 6 Months   Thomasene Ripple. Ted Leonhart NP-C    10/09/2022, 8:26 AM [facility] [address] Office [phone] Fax [phone]  Notice: This dictation was prepared with Dragon dictation along with smaller phrase technology. Any transcriptional errors that result from this process are unintentional and may not be corrected upon review.  I spent 14 minutes examining this patient, reviewing medications, and using patient centered shared decision making involving her cardiac care.  Prior to her visit I spent greater than 20 minutes reviewing her past medical history,  medications, and prior cardiac tests.

## 2022-10-09 ENCOUNTER — Ambulatory Visit: Payer: Medicare HMO | Attending: General Practice | Admitting: General Practice

## 2022-10-09 ENCOUNTER — Encounter: Payer: Self-pay | Admitting: General Practice

## 2022-10-09 VITALS — BP 112/62 | HR 70 | Ht 64.0 in | Wt 186.0 lb

## 2022-10-09 DIAGNOSIS — R931 Abnormal findings on diagnostic imaging of heart and coronary circulation: Secondary | ICD-10-CM | POA: Diagnosis not present

## 2022-10-09 DIAGNOSIS — I10 Essential (primary) hypertension: Secondary | ICD-10-CM

## 2022-10-09 DIAGNOSIS — R55 Syncope and collapse: Secondary | ICD-10-CM

## 2022-10-09 DIAGNOSIS — E782 Mixed hyperlipidemia: Secondary | ICD-10-CM | POA: Diagnosis not present

## 2022-10-09 NOTE — Patient Instructions (Signed)
Medication Instructions:  The current medical regimen is effective;  continue present plan and medications as directed. Please refer to the Current Medication list given to you today.  *If you need a refill on your cardiac medications before your next appointment, please call your pharmacy*  Lab Work: NONE If you have labs (blood work) drawn today and your tests are completely normal, you will receive your results only by:  MyChart Message (if you have MyChart) OR A paper copy in the mail If you have any lab test that is abnormal or we need to change your treatment, we will call you to review the results.  Other: INCREASE PHYSICAL ACTIVITY AS TOLERATED  Follow-Up: At [facility], you and your health needs are our priority.  As part of our continuing mission to provide you with exceptional heart care, we have created designated Provider Care Teams.  These Care Teams include your primary Cardiologist (physician) and Advanced Practice Providers (APPs -  Physician Assistants and Nurse Practitioners) who all work together to provide you with the care you need, when you need it.  Your next appointment:   6 month(s)  Provider:   Nanetta Batty, MD  or Edd Fabian, FNP

## 2022-10-16 NOTE — Progress Notes (Signed)
Carelink Summary Report / Loop Recorder 

## 2022-10-29 ENCOUNTER — Inpatient Hospital Stay: Payer: Medicare HMO | Attending: Internal Medicine

## 2022-10-29 ENCOUNTER — Encounter (HOSPITAL_COMMUNITY): Payer: Self-pay

## 2022-10-29 ENCOUNTER — Other Ambulatory Visit: Payer: Self-pay

## 2022-10-29 ENCOUNTER — Ambulatory Visit (HOSPITAL_COMMUNITY)
Admission: RE | Admit: 2022-10-29 | Discharge: 2022-10-29 | Disposition: A | Discharge status: Home / Self Care | Payer: Medicare HMO | Source: Ambulatory Visit | Attending: Internal Medicine | Admitting: Internal Medicine

## 2022-10-29 DIAGNOSIS — Z85110 Personal history of malignant carcinoid tumor of bronchus and lung: Secondary | ICD-10-CM | POA: Insufficient documentation

## 2022-10-29 DIAGNOSIS — Z902 Acquired absence of lung [part of]: Secondary | ICD-10-CM | POA: Diagnosis not present

## 2022-10-29 DIAGNOSIS — I700 Atherosclerosis of aorta: Secondary | ICD-10-CM | POA: Diagnosis not present

## 2022-10-29 DIAGNOSIS — Z87891 Personal history of nicotine dependence: Secondary | ICD-10-CM | POA: Diagnosis not present

## 2022-10-29 DIAGNOSIS — C3432 Malignant neoplasm of lower lobe, left bronchus or lung: Secondary | ICD-10-CM

## 2022-10-29 DIAGNOSIS — Z800 Family history of malignant neoplasm of digestive organs: Secondary | ICD-10-CM | POA: Insufficient documentation

## 2022-10-29 LAB — CBC WITH DIFFERENTIAL (CANCER CENTER ONLY)
Abs Immature Granulocytes: 0.02 10*3/uL (ref 0.00–0.07)
Basophils Absolute: 0 10*3/uL (ref 0.0–0.1)
Basophils Relative: 0 %
Eosinophils Absolute: 0.1 10*3/uL (ref 0.0–0.5)
Eosinophils Relative: 1 %
HCT: 41.7 % (ref 36.0–46.0)
Hemoglobin: 14.4 g/dL (ref 12.0–15.0)
Immature Granulocytes: 0 %
Lymphocytes Relative: 13 %
Lymphs Abs: 1.1 10*3/uL (ref 0.7–4.0)
MCH: 31.2 pg (ref 26.0–34.0)
MCHC: 34.5 g/dL (ref 30.0–36.0)
MCV: 90.3 fL (ref 80.0–100.0)
Monocytes Absolute: 0.5 10*3/uL (ref 0.1–1.0)
Monocytes Relative: 6 %
Neutro Abs: 6.2 10*3/uL (ref 1.7–7.7)
Neutrophils Relative %: 80 %
Platelet Count: 166 10*3/uL (ref 150–400)
RBC: 4.62 MIL/uL (ref 3.87–5.11)
RDW: 12.5 % (ref 11.5–15.5)
WBC Count: 7.9 10*3/uL (ref 4.0–10.5)
nRBC: 0 % (ref 0.0–0.2)

## 2022-10-29 LAB — CMP (CANCER CENTER ONLY)
ALT: 34 U/L (ref 0–44)
AST: 26 U/L (ref 15–41)
Albumin: 4.1 g/dL (ref 3.5–5.0)
Alkaline Phosphatase: 65 U/L (ref 38–126)
Anion gap: 6 (ref 5–15)
BUN: 15 mg/dL (ref 8–23)
CO2: 32 mmol/L (ref 22–32)
Calcium: 9.2 mg/dL (ref 8.9–10.3)
Chloride: 100 mmol/L (ref 98–111)
Creatinine: 1.08 mg/dL — ABNORMAL HIGH (ref 0.44–1.00)
GFR, Estimated: 55 mL/min — ABNORMAL LOW (ref >60)
Glucose, Bld: 164 mg/dL — ABNORMAL HIGH (ref 70–99)
Potassium: 4.3 mmol/L (ref 3.5–5.1)
Sodium: 138 mmol/L (ref 135–145)
Total Bilirubin: 0.8 mg/dL (ref 0.3–1.2)
Total Protein: 6.3 g/dL — ABNORMAL LOW (ref 6.5–8.1)

## 2022-10-29 MED ORDER — IOHEXOL 300 MG/ML  SOLN
75.0000 mL | Freq: Once | INTRAMUSCULAR | Status: AC | PRN
  Administered 2022-10-29: 75 mL via INTRAVENOUS

## 2022-10-29 MED ORDER — SODIUM CHLORIDE (PF) 0.9 % IJ SOLN
INTRAMUSCULAR | Status: AC
  Filled 2022-10-29: qty 50

## 2022-10-31 ENCOUNTER — Inpatient Hospital Stay: Payer: Medicare HMO | Admitting: Internal Medicine

## 2022-11-15 ENCOUNTER — Ambulatory Visit (INDEPENDENT_AMBULATORY_CARE_PROVIDER_SITE_OTHER): Payer: Medicare HMO

## 2022-11-15 DIAGNOSIS — R55 Syncope and collapse: Secondary | ICD-10-CM

## 2022-11-15 LAB — CUP PACEART REMOTE DEVICE CHECK
Date Time Interrogation Session: 20240523074245
Implantable Pulse Generator Implant Date: 20240220
Pulse Gen Model: 436066
Pulse Gen Serial Number: 95051469

## 2022-12-03 ENCOUNTER — Telehealth: Payer: Self-pay

## 2022-12-03 NOTE — Telephone Encounter (Signed)
Patient with hx of syncope - has ILR. Episode details received 2 recordings between Dec 01, 2022, 1:58:58 AM and Dec 01, 2022, 10:14:10 AM  1 Brady event on 6/8 at 10:14am with 16 seconds of bpm at 26.  LMOV for patient to call back and discuss if any symptoms.  (Second event was a nocturnal 12 second brady event without concern).

## 2022-12-04 NOTE — Telephone Encounter (Signed)
Patient with hx of syncope - has ILR.   Episode details received Type: Huston Foley (recorded on Dec 03, 2022, 1:07:12 PM)  Spoke w/ patients grandson. Patient asleep. Will have her call upon awakening.

## 2022-12-04 NOTE — Telephone Encounter (Signed)
Spoke with patient, patient typically sleeps from 1:00-4:30pm in the afternoon.

## 2022-12-04 NOTE — Telephone Encounter (Signed)
Patient denies dizziness or passing out.

## 2022-12-05 NOTE — Progress Notes (Signed)
[employer] Loop Recorder 

## 2022-12-14 ENCOUNTER — Other Ambulatory Visit: Payer: Self-pay | Admitting: Cardiovascular Disease

## 2022-12-16 IMAGING — DX DG CHEST 1V PORT
1 series · 1 of 1 positions shown · non-contrast
Comparison: Earlier same day; 10/02/2021; 10/01/2021

CLINICAL DATA: Post lobectomy.  Follow-up pneumothorax.

EXAM:
PORTABLE CHEST 1 VIEW

[chest ap]
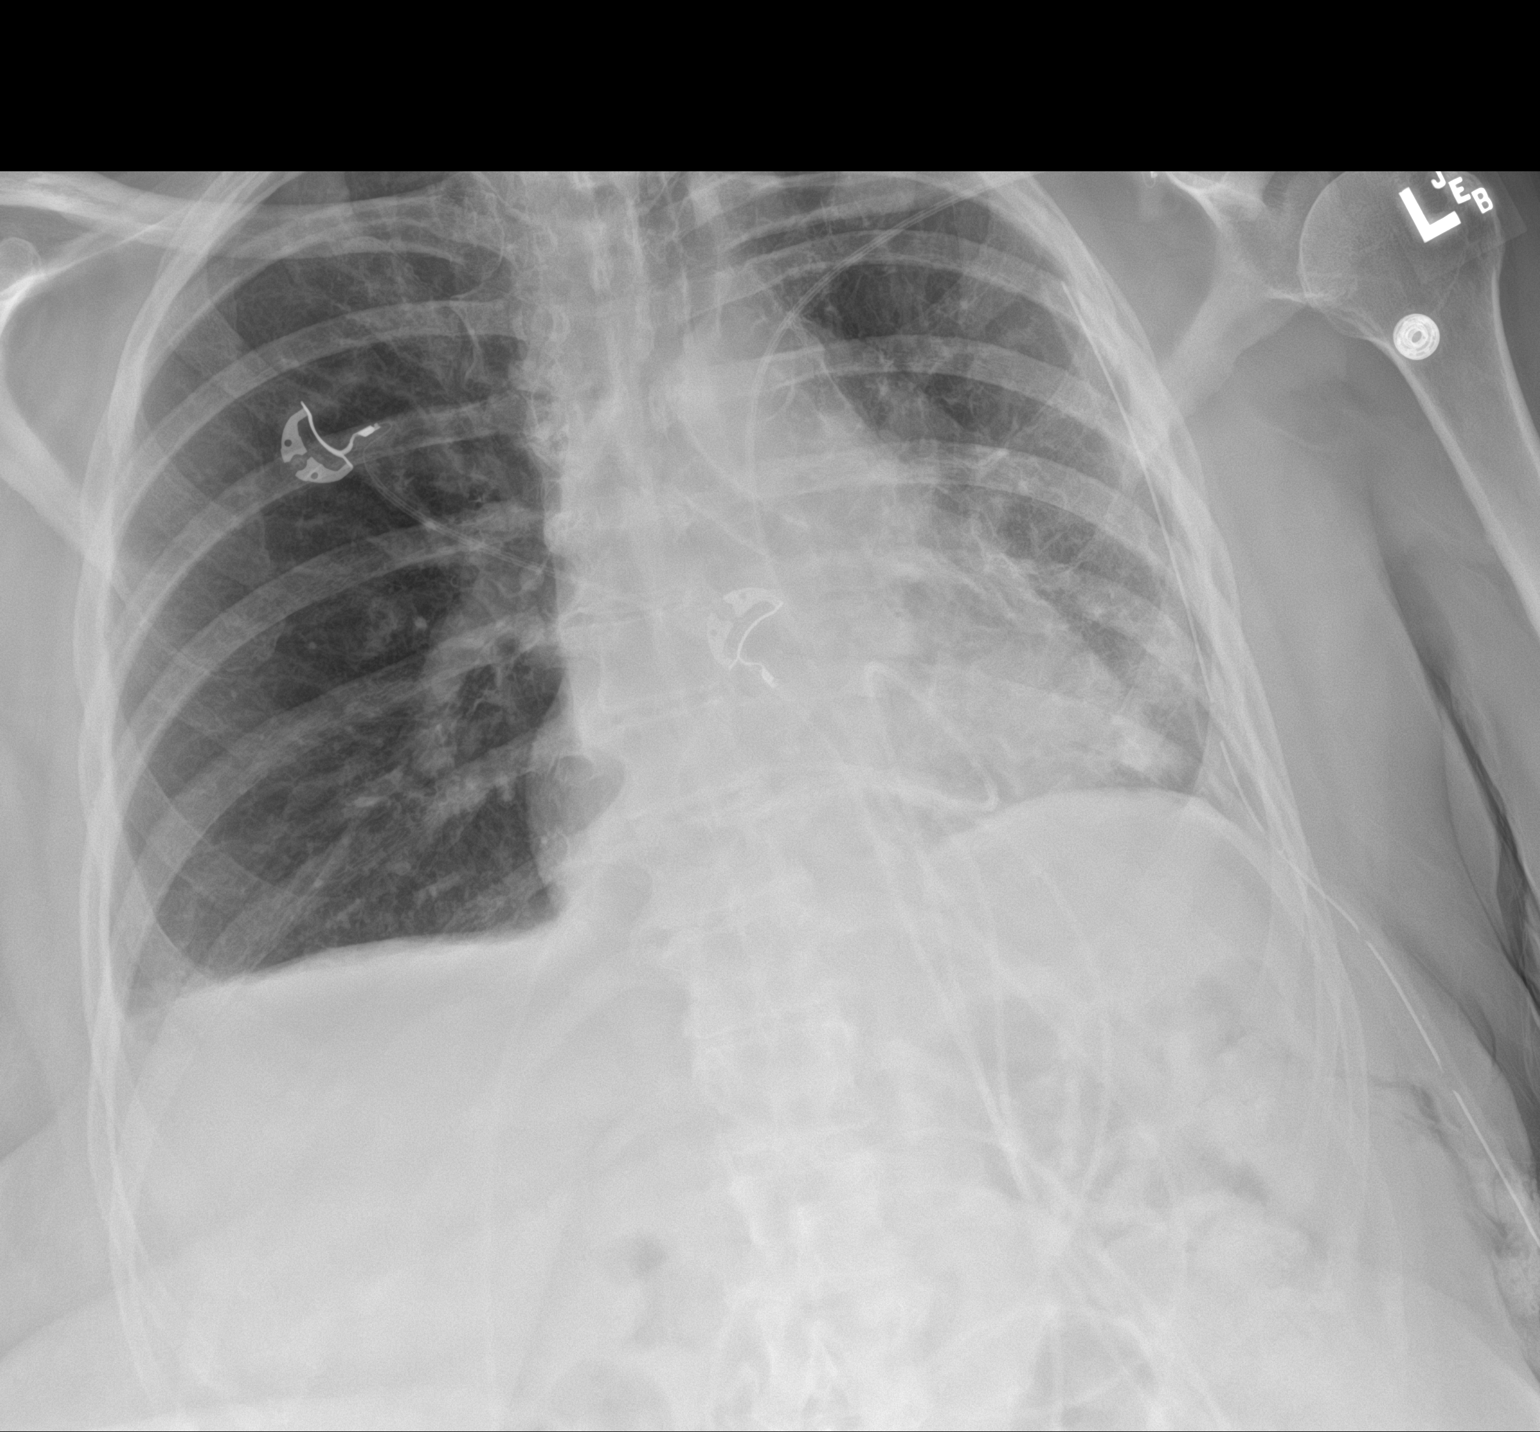

[1 of 1 positions shown; findings below may reference images not displayed]

FINDINGS: Unchanged cardiac silhouette and mediastinal contours with
atherosclerotic plaque within the thoracic aorta. Interval
retraction of apically directed left lateral chest tube, now with 2
side holes external to the left pleural space though with unchanged
size and appearance of tiny left apical pneumothorax. Redemonstrated
minimal left basilar heterogeneous opacities. Unchanged trace
right-sided effusion. No right-sided pneumothorax. No evidence of
edema. No acute osseous abnormalities.
IMPRESSION: 1. Interval retraction of left-sided chest tube, now with 2
sideholes external to the left pleural space though is unchanged
size and appearance of tiny left apical pneumothorax.
2. Minimal left basilar heterogeneous opacities, atelectasis versus
contusion.
3. Unchanged trace left-sided effusion.

## 2022-12-17 ENCOUNTER — Ambulatory Visit: Payer: Medicare HMO

## 2022-12-17 DIAGNOSIS — R55 Syncope and collapse: Secondary | ICD-10-CM | POA: Diagnosis not present

## 2022-12-17 IMAGING — CR DG CHEST 2V
2 series · 2 of 2 positions shown · non-contrast
Comparison: 10/03/2021

CLINICAL DATA: Pneumothorax.

EXAM:
CHEST - 2 VIEW

[chest lat]
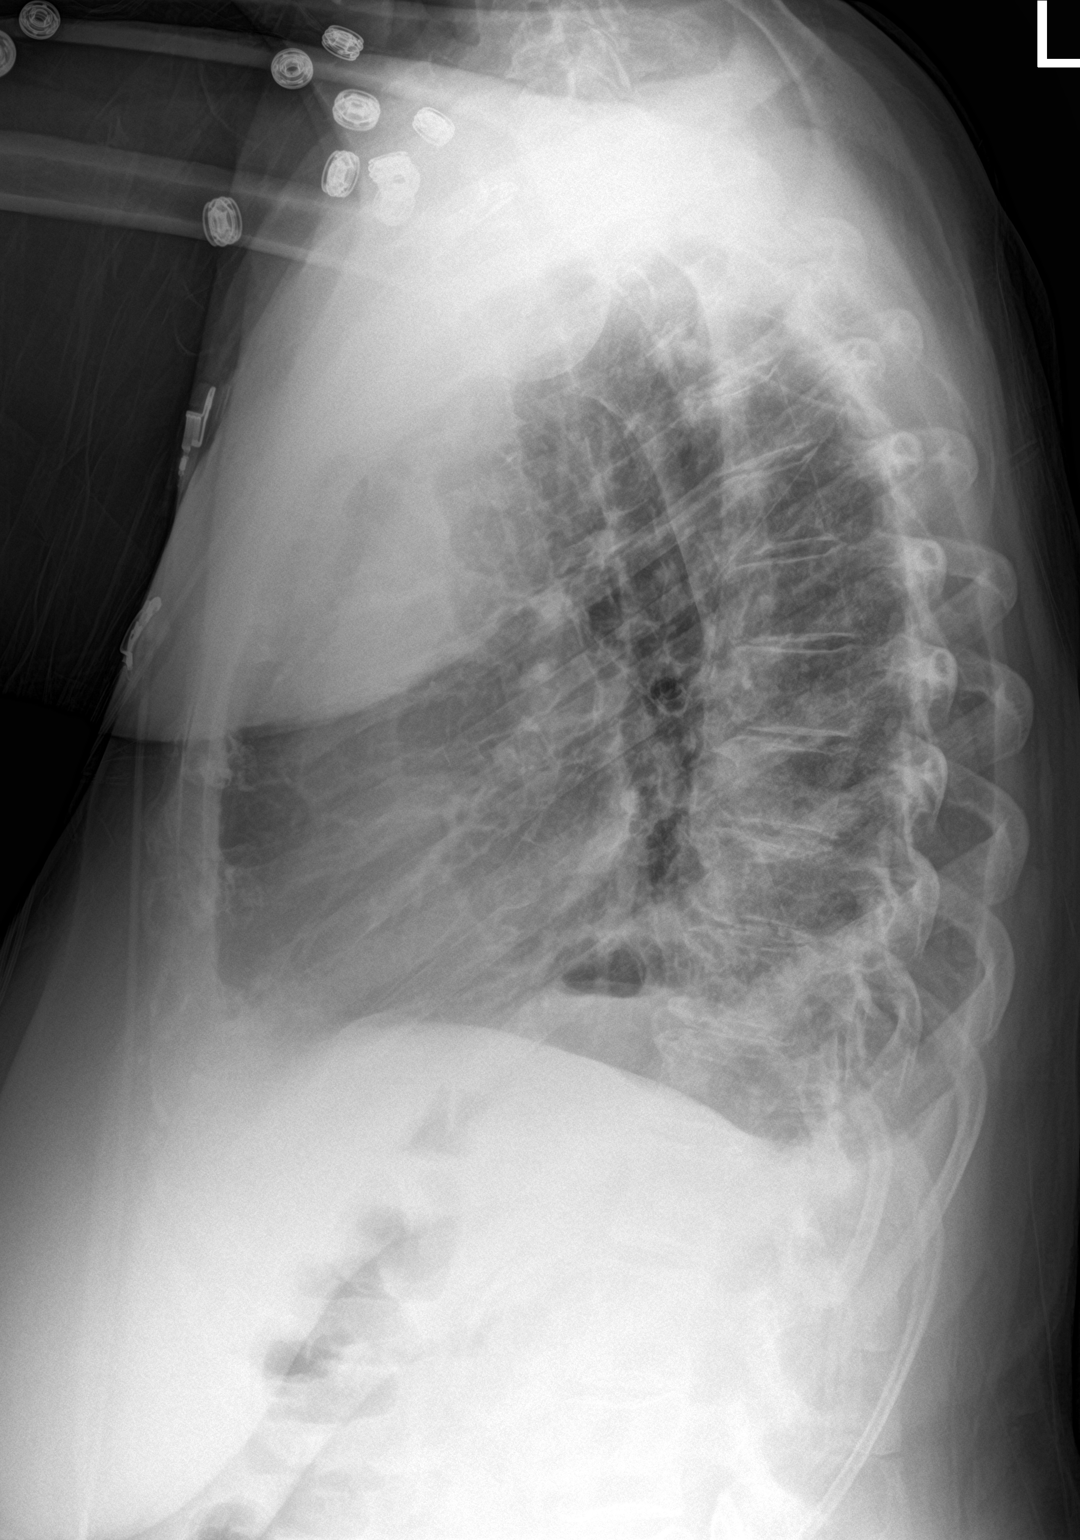

[chest pa]
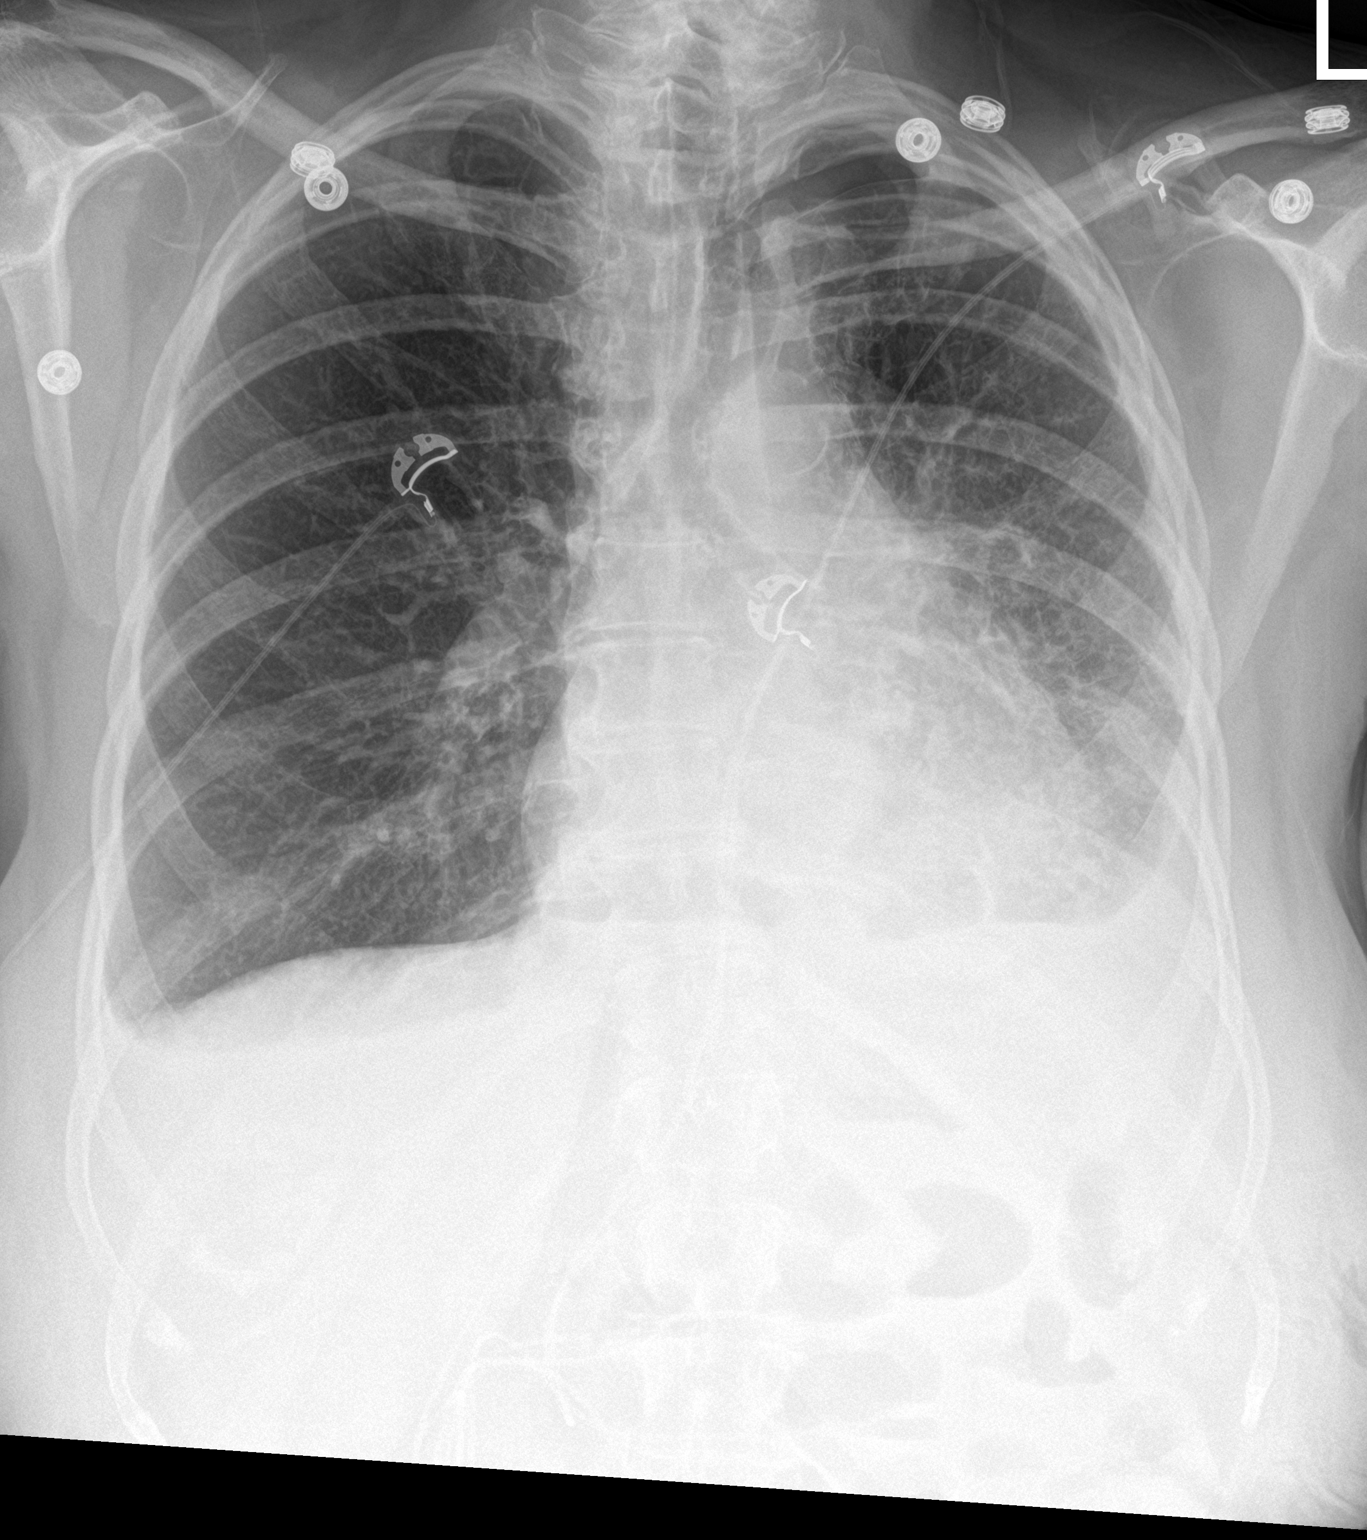

[2 of 2 positions shown; findings below may reference images not displayed]

FINDINGS: Similar small left apical pneumothorax. The left chest tube seen
previously has been removed in the interval. Small left pleural
effusion evident. There is a tiny effusion on the right as well.
Cardiopericardial silhouette is at upper limits of normal for size.
Interstitial and airspace disease at the left base is similar.
IMPRESSION: 1. Similar small left apical pneumothorax status post chest tube
removal.
2. Persistent interstitial and airspace disease at the left base
with small bilateral pleural effusions.

## 2022-12-31 LAB — CUP PACEART REMOTE DEVICE CHECK
Date Time Interrogation Session: 20240628124942
Implantable Pulse Generator Implant Date: 20240220
Pulse Gen Model: 436066
Pulse Gen Serial Number: 95051469

## 2023-01-07 NOTE — Progress Notes (Signed)
[employer] Loop Recorder 

## 2023-01-17 ENCOUNTER — Ambulatory Visit: Payer: Medicare HMO

## 2023-01-17 DIAGNOSIS — R55 Syncope and collapse: Secondary | ICD-10-CM | POA: Diagnosis not present

## 2023-01-17 LAB — CUP PACEART REMOTE DEVICE CHECK
Date Time Interrogation Session: 20240725081708
Implantable Pulse Generator Implant Date: 20240220
Pulse Gen Model: 436066
Pulse Gen Serial Number: 95051469

## 2023-01-21 ENCOUNTER — Telehealth: Payer: Self-pay

## 2023-01-21 NOTE — Telephone Encounter (Signed)
B[employer] alert received for 1 tachycardia event that occurred 01/18/23 @ 16:27 with duration 30 sec. ILR implanted for syncope. Attempted to contact patient to assess. No answer, LMTCB.

## 2023-01-23 IMAGING — DX DG CHEST 2V
2 series · 2 of 2 positions shown · non-contrast
Comparison: Radiograph October 04, 2021

CLINICAL DATA: Follow-up prior lobectomy.

EXAM:
CHEST - 2 VIEW

[dg chest 2 view (1 of 2)]
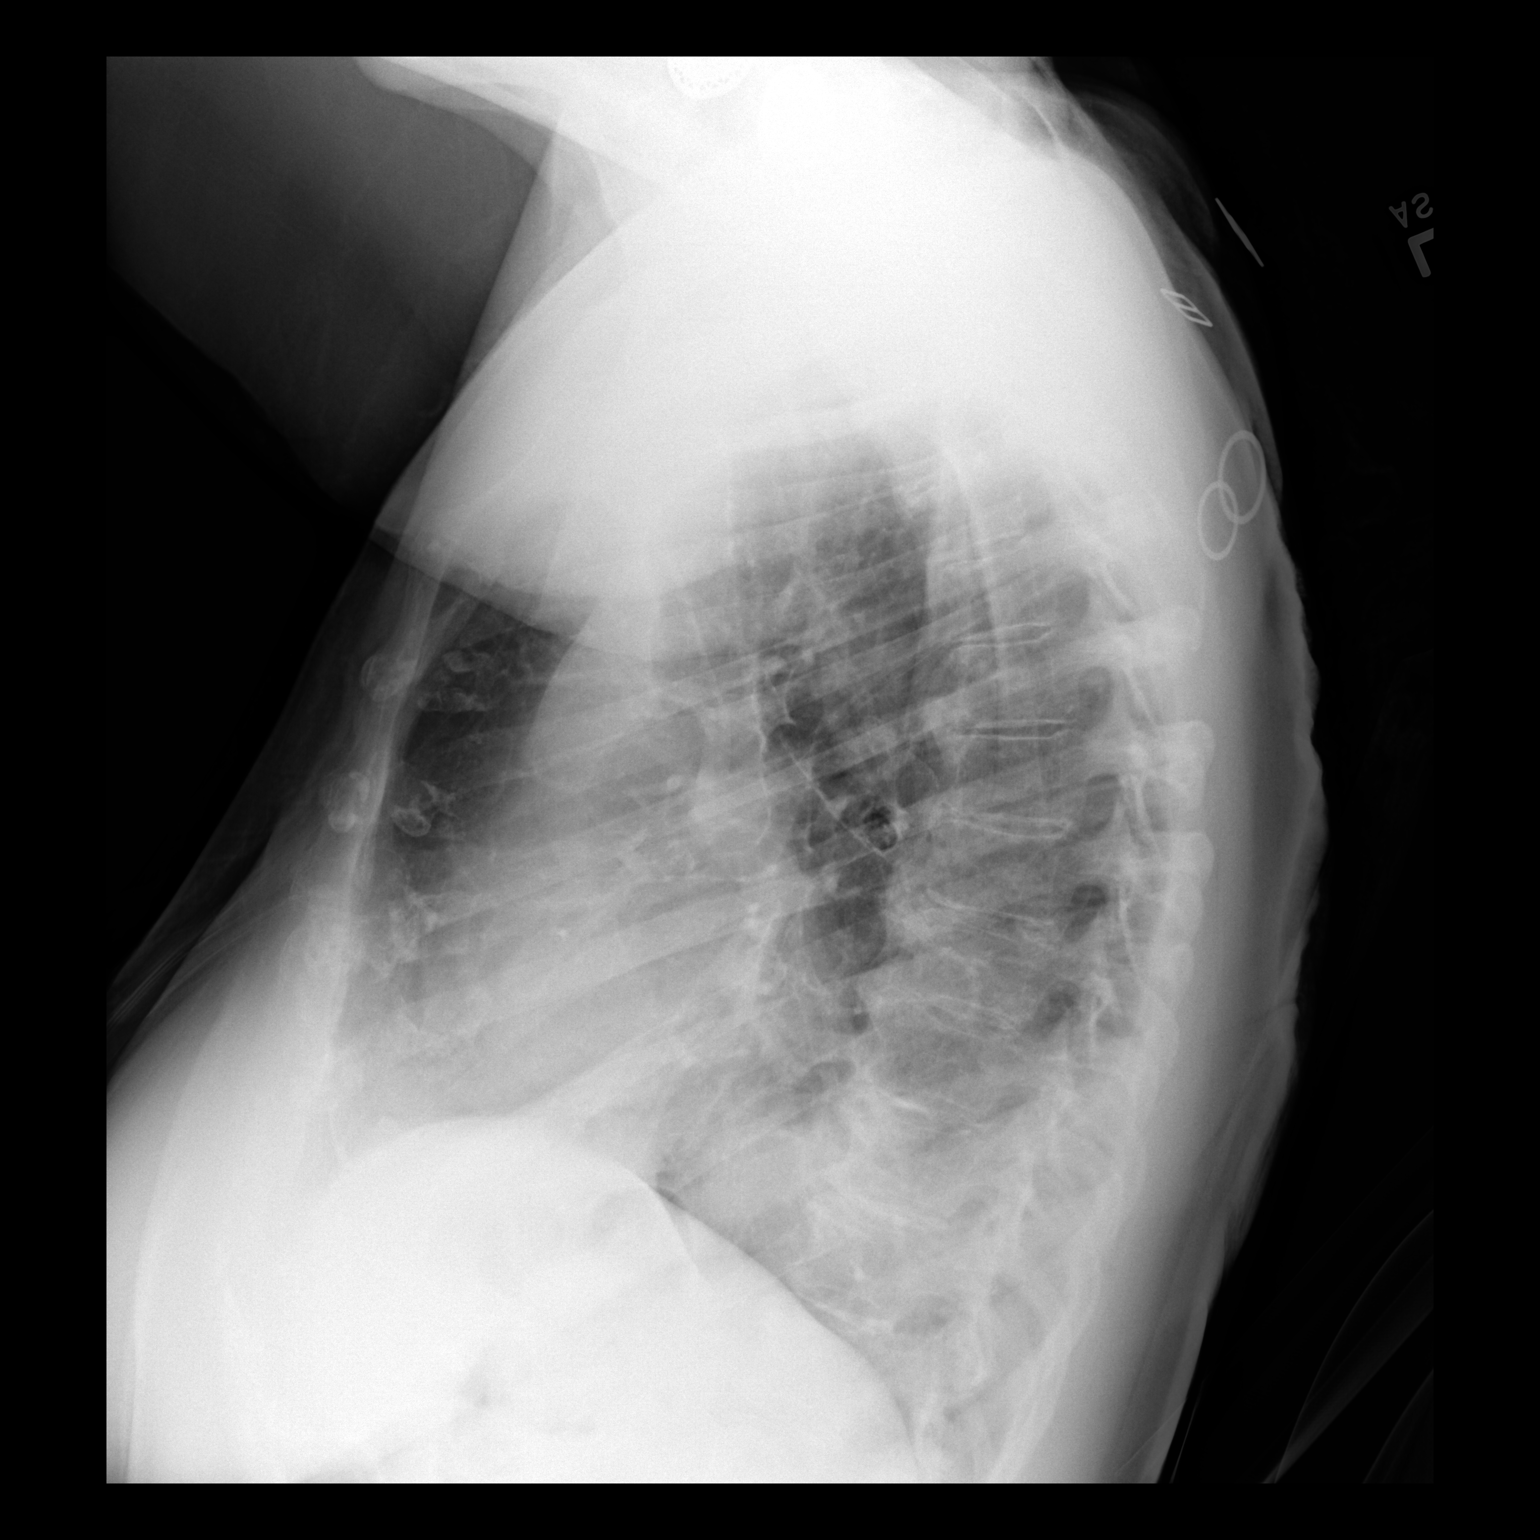

[dg chest 2 view (2 of 2)]
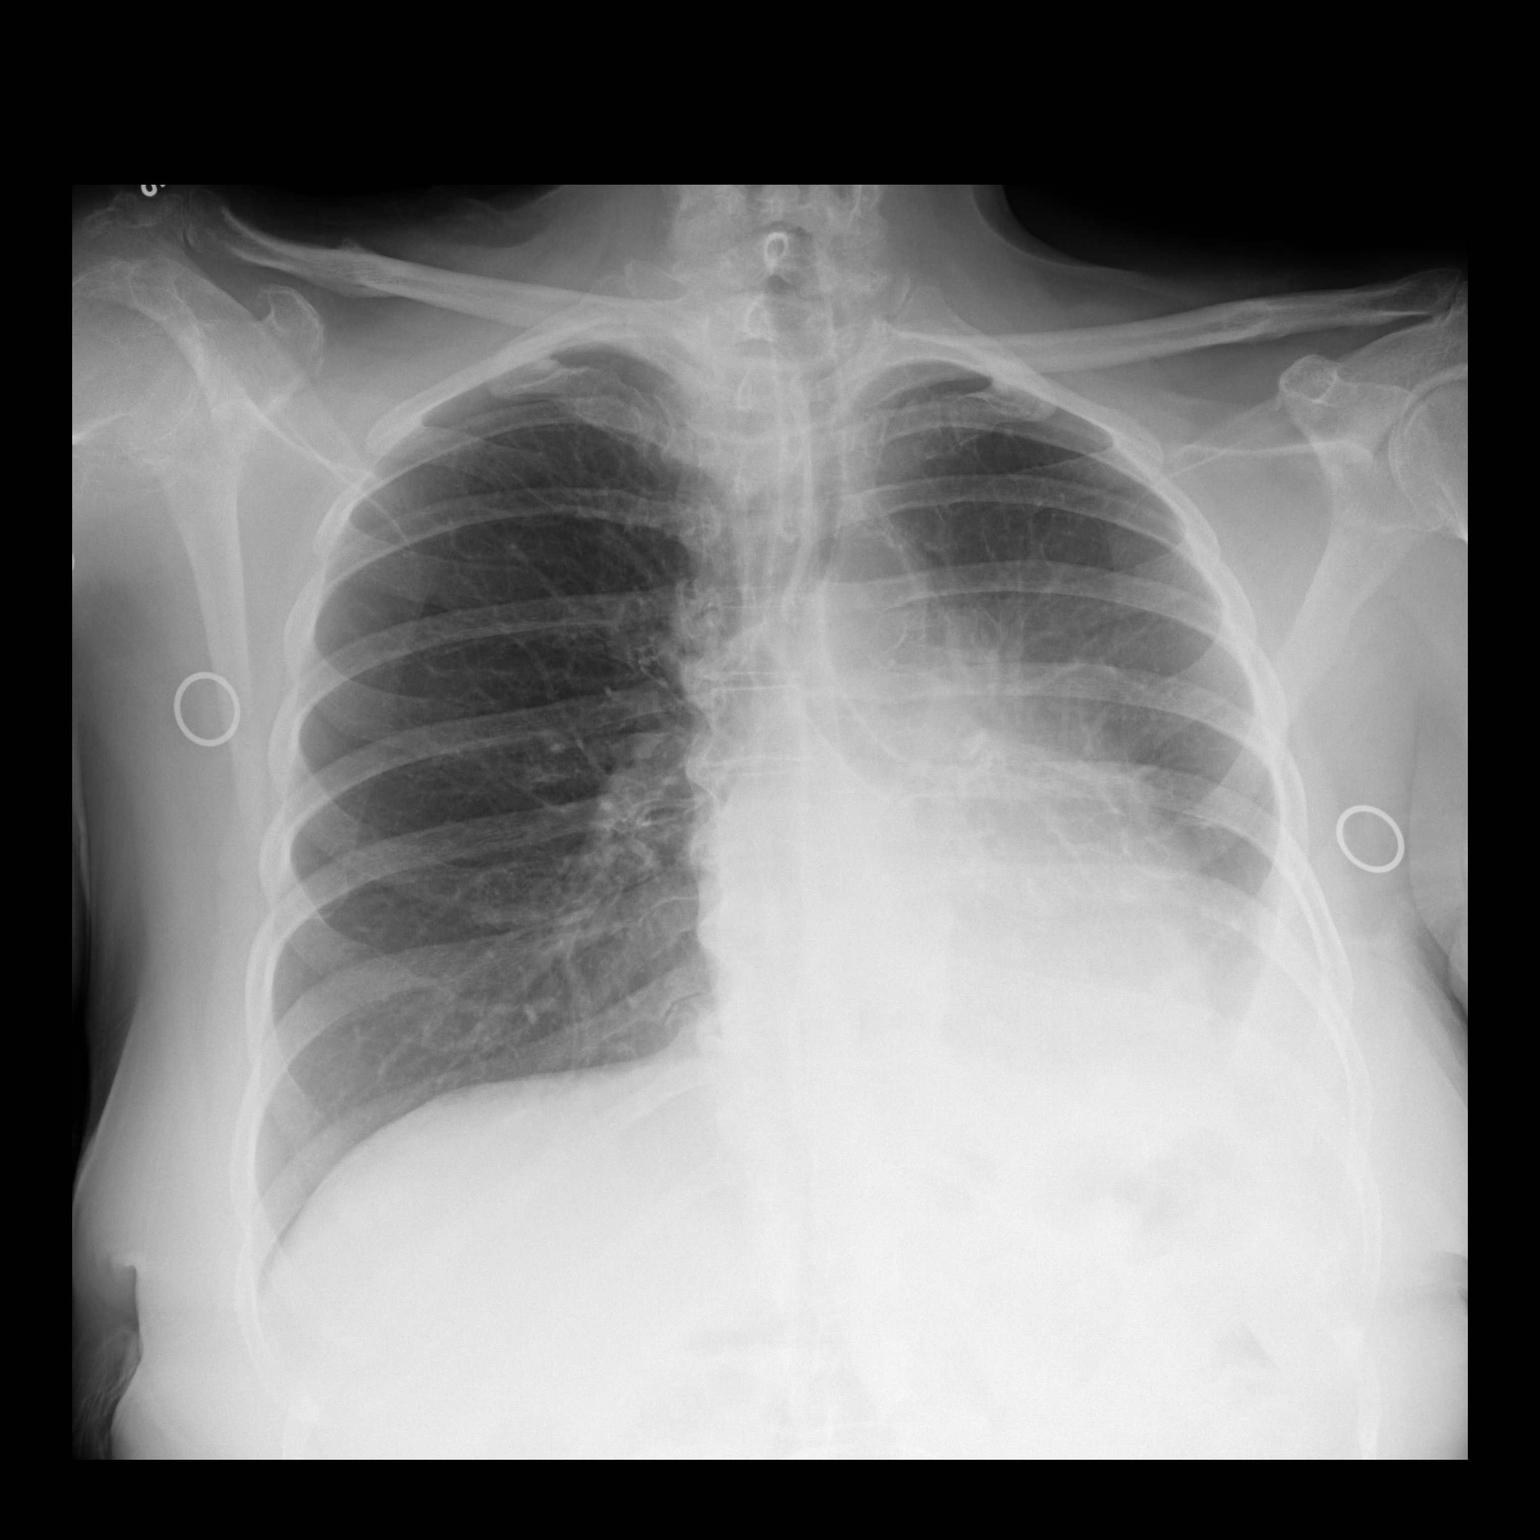

[2 of 2 positions shown; findings below may reference images not displayed]

FINDINGS: The heart size and mediastinal contours are within normal limits.
Surgical changes of prior left lower lobectomy are again seen with a
small left pleural effusion and adjacent consolidation. No visible
pneumothorax. The visualized skeletal structures are unremarkable.
IMPRESSION: Surgical changes of left lower lobectomy with a persistent small
left pleural effusion and adjacent consolidation. No visible
pneumothorax.

## 2023-01-24 NOTE — Telephone Encounter (Signed)
Left message for daughter per [facility] requesting call back.

## 2023-01-25 NOTE — Telephone Encounter (Signed)
Spoke with daughter.  Per daughter she was with patient that day, no symptoms noted.  Advised will continue to monitor.

## 2023-01-28 NOTE — Progress Notes (Signed)
Carelink Summary Report / Loop Recorder 

## 2023-02-11 ENCOUNTER — Emergency Department (HOSPITAL_COMMUNITY): Payer: Medicare HMO

## 2023-02-11 ENCOUNTER — Other Ambulatory Visit: Payer: Self-pay

## 2023-02-11 ENCOUNTER — Inpatient Hospital Stay (HOSPITAL_COMMUNITY)
Admission: EM | Admit: 2023-02-11 | Discharge: 2023-02-15 | DRG: 243 | Disposition: A | Discharge status: Home / Self Care | Payer: Medicare HMO | Attending: Cardiology | Admitting: Cardiology

## 2023-02-11 ENCOUNTER — Telehealth: Payer: Self-pay

## 2023-02-11 ENCOUNTER — Encounter (HOSPITAL_COMMUNITY): Payer: Self-pay

## 2023-02-11 ENCOUNTER — Inpatient Hospital Stay (HOSPITAL_COMMUNITY): Payer: Medicare HMO

## 2023-02-11 DIAGNOSIS — I083 Combined rheumatic disorders of mitral, aortic and tricuspid valves: Secondary | ICD-10-CM | POA: Diagnosis not present

## 2023-02-11 DIAGNOSIS — I442 Atrioventricular block, complete: Secondary | ICD-10-CM | POA: Diagnosis not present

## 2023-02-11 DIAGNOSIS — I495 Sick sinus syndrome: Secondary | ICD-10-CM | POA: Diagnosis not present

## 2023-02-11 DIAGNOSIS — R42 Dizziness and giddiness: Secondary | ICD-10-CM | POA: Diagnosis not present

## 2023-02-11 DIAGNOSIS — F419 Anxiety disorder, unspecified: Secondary | ICD-10-CM | POA: Diagnosis not present

## 2023-02-11 DIAGNOSIS — R9431 Abnormal electrocardiogram [ECG] [EKG]: Secondary | ICD-10-CM | POA: Diagnosis not present

## 2023-02-11 DIAGNOSIS — F32A Depression, unspecified: Secondary | ICD-10-CM | POA: Diagnosis not present

## 2023-02-11 DIAGNOSIS — Z87891 Personal history of nicotine dependence: Secondary | ICD-10-CM

## 2023-02-11 DIAGNOSIS — E119 Type 2 diabetes mellitus without complications: Secondary | ICD-10-CM | POA: Diagnosis not present

## 2023-02-11 DIAGNOSIS — G8929 Other chronic pain: Secondary | ICD-10-CM | POA: Diagnosis present

## 2023-02-11 DIAGNOSIS — I10 Essential (primary) hypertension: Secondary | ICD-10-CM | POA: Diagnosis not present

## 2023-02-11 DIAGNOSIS — K219 Gastro-esophageal reflux disease without esophagitis: Secondary | ICD-10-CM | POA: Diagnosis present

## 2023-02-11 DIAGNOSIS — Z8249 Family history of ischemic heart disease and other diseases of the circulatory system: Secondary | ICD-10-CM | POA: Diagnosis not present

## 2023-02-11 DIAGNOSIS — I2510 Atherosclerotic heart disease of native coronary artery without angina pectoris: Secondary | ICD-10-CM | POA: Diagnosis present

## 2023-02-11 DIAGNOSIS — Z79899 Other long term (current) drug therapy: Secondary | ICD-10-CM | POA: Diagnosis not present

## 2023-02-11 DIAGNOSIS — Z902 Acquired absence of lung [part of]: Secondary | ICD-10-CM

## 2023-02-11 DIAGNOSIS — Z90710 Acquired absence of both cervix and uterus: Secondary | ICD-10-CM

## 2023-02-11 DIAGNOSIS — Z882 Allergy status to sulfonamides status: Secondary | ICD-10-CM

## 2023-02-11 DIAGNOSIS — Z950 Presence of cardiac pacemaker: Secondary | ICD-10-CM | POA: Diagnosis not present

## 2023-02-11 DIAGNOSIS — E785 Hyperlipidemia, unspecified: Secondary | ICD-10-CM | POA: Diagnosis present

## 2023-02-11 DIAGNOSIS — R079 Chest pain, unspecified: Secondary | ICD-10-CM | POA: Diagnosis not present

## 2023-02-11 DIAGNOSIS — Z85118 Personal history of other malignant neoplasm of bronchus and lung: Secondary | ICD-10-CM | POA: Diagnosis not present

## 2023-02-11 DIAGNOSIS — Z888 Allergy status to other drugs, medicaments and biological substances status: Secondary | ICD-10-CM | POA: Diagnosis not present

## 2023-02-11 DIAGNOSIS — M549 Dorsalgia, unspecified: Secondary | ICD-10-CM | POA: Diagnosis present

## 2023-02-11 DIAGNOSIS — M1990 Unspecified osteoarthritis, unspecified site: Secondary | ICD-10-CM | POA: Diagnosis present

## 2023-02-11 DIAGNOSIS — I3139 Other pericardial effusion (noninflammatory): Secondary | ICD-10-CM | POA: Diagnosis not present

## 2023-02-11 LAB — CBC
HCT: 43.7 % (ref 36.0–46.0)
Hemoglobin: 14.4 g/dL (ref 12.0–15.0)
MCH: 30.6 pg (ref 26.0–34.0)
MCHC: 33 g/dL (ref 30.0–36.0)
MCV: 93 fL (ref 80.0–100.0)
Platelets: 234 10*3/uL (ref 150–400)
RBC: 4.7 MIL/uL (ref 3.87–5.11)
RDW: 12.3 % (ref 11.5–15.5)
WBC: 9.7 10*3/uL (ref 4.0–10.5)
nRBC: 0 % (ref 0.0–0.2)

## 2023-02-11 LAB — CREATININE, SERUM
Creatinine, Ser: 1.07 mg/dL — ABNORMAL HIGH (ref 0.44–1.00)
GFR, Estimated: 55 mL/min — ABNORMAL LOW (ref >60)

## 2023-02-11 LAB — BASIC METABOLIC PANEL
Anion gap: 12 (ref 5–15)
BUN: 20 mg/dL (ref 8–23)
CO2: 27 mmol/L (ref 22–32)
Calcium: 9.2 mg/dL (ref 8.9–10.3)
Chloride: 97 mmol/L — ABNORMAL LOW (ref 98–111)
Creatinine, Ser: 0.97 mg/dL (ref 0.44–1.00)
GFR, Estimated: >60 mL/min (ref >60)
Glucose, Bld: 228 mg/dL — ABNORMAL HIGH (ref 70–99)
Potassium: 3.7 mmol/L (ref 3.5–5.1)
Sodium: 136 mmol/L (ref 135–145)

## 2023-02-11 LAB — TROPONIN I (HIGH SENSITIVITY)
Troponin I (High Sensitivity): 8 ng/L (ref <18)
Troponin I (High Sensitivity): 6 ng/L (ref <18)

## 2023-02-11 MED ORDER — ACETAMINOPHEN 325 MG PO TABS
650.0000 mg | ORAL_TABLET | ORAL | Status: DC | PRN
  Administered 2023-02-11: 650 mg via ORAL
  Administered 2023-02-13: 325 mg via ORAL
  Administered 2023-02-14 (×2): 650 mg via ORAL
  Filled 2023-02-11 (×4): qty 2

## 2023-02-11 MED ORDER — NITROGLYCERIN 0.4 MG SL SUBL: 0.4000 mg | SUBLINGUAL_TABLET | SUBLINGUAL | Status: DC | PRN

## 2023-02-11 MED ORDER — HEPARIN SODIUM (PORCINE) 5000 UNIT/ML IJ SOLN
5000.0000 [IU] | Freq: Three times a day (TID) | INTRAMUSCULAR | Status: DC
  Administered 2023-02-11 – 2023-02-15 (×10): 5000 [IU] via SUBCUTANEOUS
  Filled 2023-02-11 (×11): qty 1

## 2023-02-11 MED ORDER — SODIUM CHLORIDE 0.9 % IV BOLUS
1000.0000 mL | Freq: Once | INTRAVENOUS | Status: AC
  Administered 2023-02-11: 1000 mL via INTRAVENOUS

## 2023-02-11 NOTE — H&P (Signed)
Cardiology Admission History and Physical   Patient ID: Alexandra Berger MRN: [id number]; DOB: [birth year]   Admission date: 02/11/2023  PCP:  Moshe Cipro, NP (Inactive)   [facility] Providers Cardiologist:  Nanetta Batty, MD  EP: Dr. Lalla Brothers  Chief Complaint:  pause on loop monitor noted  Patient Profile:   Alexandra Berger is a 72 y.o. 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  History of Present Illness:   [name] was admitted back in Feb 2024 with syncope (which she had a hx of, longstanding orthostatic symptoms).  EP consulted with unclear etiology of her recent syncope, underwent loop implant to better evaluate and follow her symptoms  She has been noted to have some bradycardia/transient junctional rates associated with sleep She was also noted to have an event of asymptomatic ST approx 30 seconds towards 150bpm  A transmission alert today via the device clinic with 17 second pause, 02/09/23 @ 11:29 The pt was called and denied symptoms/awareness, in review with Dr. Lalla Brothers advised to go to the ER. In further review with EP RN in the office, pt reported she was not taking bisoprolol.  K+ 3.7 BUN/Creat 20/0.97 WBC 9.7 H/H 14/43 Plts 234  She denies any near syncope or syncope.  No CP, palpitations or cardiac awareness She has a terrible back and most of the day is in bed, does not ambulate much at all., She has not had syncope but on occasion does lay her head back feeling weak sometimes.  Nothing particular over the weekend or the 17th specifically  Discussed., she confirms full code status   Past Medical History:  Diagnosis Date   Anxiety and depression    Chronic back pain    Diabetes mellitus without complication (HCC)    DJD  (degenerative joint disease)    Frequent PVCs    GERD (gastroesophageal reflux disease)    Hyperlipidemia    Hypertension    Memory difficulties    Syncope     Past Surgical History:  Procedure Laterality Date   ABDOMINAL HYSTERECTOMY     BRONCHIAL BIOPSY  08/29/2021   Procedure: BRONCHIAL BIOPSIES;  Surgeon: Josephine Igo, DO;  Location: [facility];  Service: Pulmonary;;   BRONCHIAL BRUSHINGS  08/29/2021   Procedure: BRONCHIAL BRUSHINGS;  Surgeon: Josephine Igo, DO;  Location: [facility];  Service: Pulmonary;;   BRONCHIAL NEEDLE ASPIRATION BIOPSY  08/29/2021   Procedure: BRONCHIAL NEEDLE ASPIRATION BIOPSIES;  Surgeon: Josephine Igo, DO;  Location: [facility];  Service: Pulmonary;;   CARDIAC CATHETERIZATION     2014   DILATION AND CURETTAGE OF UTERUS     EYE SURGERY Left    X 2   INTERCOSTAL NERVE BLOCK Left 09/28/2021   Procedure: INTERCOSTAL NERVE BLOCK;  Surgeon: Corliss Skains, MD;  Location: [facility] OR;  Service: Thoracic;  Laterality: Left;   LOOP RECORDER INSERTION N/A 08/14/2022   Procedure: LOOP RECORDER INSERTION;  Surgeon: Lanier Prude, MD;  Location: [facility];  Service: Cardiovascular;  Laterality: N/A;   LYMPH NODE DISSECTION Left 09/28/2021   Procedure: LYMPH NODE DISSECTION;  Surgeon: Corliss Skains, MD;  Location: [facility] OR;  Service: Thoracic;  Laterality: Left;   THORACENTESIS Left 11/17/2021   Procedure: Alanson Puls;  Surgeon: Josephine Igo, DO;  Location: [facility];  Service:  Pulmonary;  Laterality: Left;   VIDEO BRONCHOSCOPY WITH RADIAL ENDOBRONCHIAL ULTRASOUND  08/29/2021   Procedure: RADIAL ENDOBRONCHIAL ULTRASOUND;  Surgeon: Josephine Igo, DO;  Location: [facility];  Service: Pulmonary;;     Medications Prior to Admission: Prior to Admission medications   Medication Sig Start Date End Date Taking? Authorizing Provider  atorvastatin (LIPITOR) 80 MG tablet TAKE 1 TABLET BY MOUTH EVERY DAY 12/14/22   Runell Gess, MD   benazepril (LOTENSIN) 10 MG tablet TAKE 1 TABLET BY MOUTH EVERY DAY 12/14/22   Runell Gess, MD  bisoprolol (ZEBETA) 5 MG tablet Take 1 tablet (5 mg total) by mouth daily. 08/15/22   Glade Lloyd, MD  citalopram (CELEXA) 20 MG tablet Take 40 mg by mouth every evening. 03/05/14   [provider]  cycloSPORINE (RESTASIS) 0.05 % ophthalmic emulsion Place 1 drop into both eyes 2 (two) times daily.     [provider]  ergocalciferol (VITAMIN D2) 1.25 MG (50000 UT) capsule Take 50,000 Units by mouth every Sunday.    [provider]  ezetimibe (ZETIA) 10 MG tablet TAKE 1 TABLET BY MOUTH EVERY DAY 09/24/22   Runell Gess, MD  famotidine (PEPCID) 20 MG tablet Take 20 mg by mouth at bedtime.    [provider]  gabapentin (NEURONTIN) 300 MG capsule Take 300 mg by mouth daily as needed (for neuropathy).    [provider]  Lancets [facility] DELICA PLUS [place]) MISC Apply 1 each topically daily. 10/16/21   [provider]  Zinc Oxide 10 % OINT Apply 1 Application topically daily as needed (for hands).    [provider]     Allergies:    Allergies  Allergen Reactions   Sulfa Antibiotics Anaphylaxis and Shortness Of Breath   Pravastatin Other (See Comments)    dizziness   Statins Other (See Comments)    dizziness    Social History:   Social History   Socioeconomic History   Marital status: Widowed    Spouse name: Not on file   Number of children: Not on file   Years of education: Not on file   Highest education level: Not on file  Occupational History   Occupation: retired    Comment: Community education officer  Tobacco Use   Smoking status: Former    Current packs/day: 0.00    Average packs/day: 0.5 packs/day for 7.0 years (3.5 ttl pk-yrs)    Types: Cigarettes    Start date: 02/24/1995    Quit date: 02/23/2002    Years since quitting: 20.9   Smokeless tobacco: Never  Vaping Use   Vaping status: Never Used  Substance and Sexual  Activity   Alcohol use: Not Currently    Comment: wine occassionally   Drug use: No   Sexual activity: Not on file  Other Topics Concern   Not on file  Social History Narrative   Not on file   Social Determinants of Health   Financial Resource Strain: Not on file  Food Insecurity: Not on file  Transportation Needs: Not on file  Physical Activity: Not on file  Stress: Not on file  Social Connections: Not on file  Intimate Partner Violence: Not on file    Family History:   The patient's family history includes CAD in her father and mother; Colon cancer (age of onset: 96) in her brother; Heart attack in her father.    ROS:  Please see the history of present illness.  All other ROS reviewed and negative.  Physical Exam/Data:   Vitals:   02/11/23 1212 02/11/23 1313  BP: 127/81   Pulse: (!) 117   Resp: 18   Temp: 98 F (36.7 C)   SpO2: 97%   Weight:  84.4 kg  Height:  5\' 4"  (1.626 m)   No intake or output data in the 24 hours ending 02/11/23 1423    02/11/2023    1:13 PM 10/09/2022    8:10 AM 08/14/2022    5:00 AM  Last 3 Weights  Weight (lbs) 186 lb 1.1 oz 186 lb 191 lb  Weight (kg) 84.4 kg 84.369 kg 86.637 kg     Body mass index is 31.94 kg/m.  General:  Well nourished, well developed, in no acute distress HEENT: normal, poor dentitian Neck: no JVD Vascular: No carotid bruits; Distal pulses 2+ bilaterally   Cardiac:  RRR; no murmurs Lungs:  clear to auscultation bilaterally, no wheezing, rhonchi or rales  Abd: soft, nontender, no hepatomegaly  Ext: no edema Musculoskeletal:  No deformities Skin: warm and dry  Neuro:  o focal abnormalities noted Psych:  Normal affect    EKG:   personally reviewed and demonstrates  ST 107bpm, Q III, aVF, T inv ST 102bpm  Q waves and T changes are new since Feb  Relevant CV Studies:  08/14/22: TTE  1. Left ventricular ejection fraction, by estimation, is 60 to 65%. The  left ventricle has normal function. The left  ventricle has no regional  wall motion abnormalities. Left ventricular diastolic parameters are  indeterminate. Elevated left atrial  pressure.   2. The mitral valve is abnormal. Moderate mitral valve regurgitation.   3. Tricuspid valve regurgitation is moderate.   4. Right ventricular systolic function is normal. The right ventricular  size is normal. There is normal pulmonary artery systolic pressure.   5. Moderate pericardial effusion. The pericardial effusion is anterior to  the right ventricle and localized near the right atrium. No evidence of  increased pericardial pressure   6. The aortic valve is tricuspid. Aortic valve regurgitation is not  visualized.   Comparison(s): Prior images reviewed side by side. Compared to prior,  mitral regurgitation and tricuspid regurgitation have increased.  Pericardial effusion is similar but predominantly RV anterior in nature.    06/12/21: Coronary Ca++ IMPRESSION: 1. Coronary calcium score of 158. This was 78th percentile for age-, race-, and sex-matched controls. 2. Dilated main pulmonary artery to 35 mm, suggestive of pulmonary hypertension. 3. Aortic atherosclerosis. 4. Aortic and mitral annular calcification.     Dec-Jan 2023, monitor 1. NSR/SB/ST 2. Freq PVCs and short runs of NSVT 3. Runs of SVT 4. Needs ROV to discuss   In review of report PVC burden was 3%  Laboratory Data:  High Sensitivity Troponin:  No results for input(s): "TROPONINIHS" in the last 720 hours.    ChemistryNo results for input(s): "NA", "K", "CL", "CO2", "GLUCOSE", "BUN", "CREATININE", "CALCIUM", "MG", "GFRNONAA", "GFRAA", "ANIONGAP" in the last 168 hours.  No results for input(s): "PROT", "ALBUMIN", "AST", "ALT", "ALKPHOS", "BILITOT" in the last 168 hours. Lipids No results for input(s): "CHOL", "TRIG", "HDL", "LABVLDL", "LDLCALC", "CHOLHDL" in the last 168 hours. Hematology Recent Labs  Lab 02/11/23 1334  WBC 9.7  RBC 4.70  HGB 14.4  HCT 43.7   MCV 93.0  MCH 30.6  MCHC 33.0  RDW 12.3  PLT 234   Thyroid No results for input(s): "TSH", "FREET4" in the last 168 hours. BNPNo results for input(s): "BNP", "PROBNP" in the last 168 hours.  DDimer No results for input(s): "DDIMER" in the last 168 hours.   Radiology/Studies:  No results found.   Assessment and Plan:   Hx of syncope >> ILR implanted Feb 2024 Significant pause episode detected No reported syncope She denies having an bisoprolol at home, none for a while, unclear, but apparently no refills  She will need PPM   2. Abnormal EKG No CP HS trop neg x1  Will update echo She may need ischemic eval  3. DM Diet controlled  4. Anxiety She reports only taking 20mg  (not 40) celexa at night > ordered  Reviewed her home meds: She does not like to use the gabapentin and doesn't use it, doesn't have/take zetia    Risk Assessment/Risk Scores:    For questions or updates, please contact [facility] Please consult [email or web address] for contact info under     Signed, Sheilah Pigeon, PA-C  02/11/2023 2:23 PM

## 2023-02-11 NOTE — ED Provider Triage Note (Signed)
Emergency Medicine Provider Triage Evaluation Note  Alexandra Berger , a 72 y.o. female  was evaluated in triage.  Pt states she was notified of a 17-second incident on her loop recorder.  She does not know what exactly was on her loop recorder but was advised to come to the ER from her doctor.  She notes some dizziness upon standing for the past month.  Denies any shortness of breath, chest pain, nausea or vomiting, arm pain, jaw pain.  Review of Systems  Positive: As above Negative: As above  Physical Exam  BP 127/81   Pulse (!) 117   Temp 98 F (36.7 C)   Resp 18   Ht 5\' 4"  (1.626 m)   Wt 84.4 kg   SpO2 97%   BMI 31.94 kg/m  Gen:   Awake, no distress   Resp:  Normal effort  MSK:   Moves extremities without difficulty  Other:  Tachycardic on cardiac auscultation, lungs clear to auscultation bilaterally  Medical Decision Making  Medically screening exam initiated at 1:41 PM.  Appropriate orders placed.  Alexandra Berger was informed that the remainder of the evaluation will be completed by another provider, this initial triage assessment does not replace that evaluation, and the importance of remaining in the ED until their evaluation is complete.     Alexandra Merles, PA-C 02/11/23 1342

## 2023-02-11 NOTE — ED Provider Notes (Signed)
[place] EMERGENCY DEPARTMENT AT [facility] Provider Note   CSN: [id number] Arrival date & time: 02/11/23  1208     History {Add pertinent medical, surgical, social history, OB history to HPI:1} No chief complaint on file.   Alexandra Berger is a 72 y.o. female.  Patient had an episode of dizziness today.  She also had on her loop recorder on Saturday a 17-second pause.  She was told by her cardiologist come to the hospital.   Dizziness      Home Medications Prior to Admission medications   Medication Sig Start Date End Date Taking? Authorizing Provider  atorvastatin (LIPITOR) 80 MG tablet TAKE 1 TABLET BY MOUTH EVERY DAY 12/14/22   Runell Gess, MD  benazepril (LOTENSIN) 10 MG tablet TAKE 1 TABLET BY MOUTH EVERY DAY 12/14/22   Runell Gess, MD  bisoprolol (ZEBETA) 5 MG tablet Take 1 tablet (5 mg total) by mouth daily. 08/15/22   Glade Lloyd, MD  citalopram (CELEXA) 20 MG tablet Take 40 mg by mouth every evening. 03/05/14   [provider]  cycloSPORINE (RESTASIS) 0.05 % ophthalmic emulsion Place 1 drop into both eyes 2 (two) times daily.     [provider]  ergocalciferol (VITAMIN D2) 1.25 MG (50000 UT) capsule Take 50,000 Units by mouth every Sunday.    [provider]  ezetimibe (ZETIA) 10 MG tablet TAKE 1 TABLET BY MOUTH EVERY DAY 09/24/22   Runell Gess, MD  famotidine (PEPCID) 20 MG tablet Take 20 mg by mouth at bedtime.    [provider]  gabapentin (NEURONTIN) 300 MG capsule Take 300 mg by mouth daily as needed (for neuropathy).    [provider]  Lancets [facility] DELICA PLUS [place]) MISC Apply 1 each topically daily. 10/16/21   [provider]  Zinc Oxide 10 % OINT Apply 1 Application topically daily as needed (for hands).    [provider]      Allergies    Sulfa antibiotics, Pravastatin, and Statins    Review of Systems   Review of Systems  Neurological:  Positive  for dizziness.    Physical Exam Updated Vital Signs BP 99/66   Pulse 88   Temp 98 F (36.7 C)   Resp 13   Ht 5\' 4"  (1.626 m)   Wt 84.4 kg   SpO2 99%   BMI 31.94 kg/m  Physical Exam  ED Results / Procedures / Treatments   Labs (all labs ordered are listed, but only abnormal results are displayed) Labs Reviewed  BASIC METABOLIC PANEL - Abnormal; Notable for the following components:      Result Value   Chloride 97 (*)    Glucose, Bld 228 (*)    All other components within normal limits  CBC  CBC  CREATININE, SERUM  TROPONIN I (HIGH SENSITIVITY)  TROPONIN I (HIGH SENSITIVITY)    EKG None  Radiology No results found.  Procedures Procedures  {Document cardiac monitor, telemetry assessment procedure when appropriate:1}  Medications Ordered in ED Medications  sodium chloride 0.9 % bolus 1,000 mL (has no administration in time range)  nitroGLYCERIN (NITROSTAT) SL tablet 0.4 mg (has no administration in time range)  acetaminophen (TYLENOL) tablet 650 mg (has no administration in time range)  heparin injection 5,000 Units (has no administration in time range)    ED Course/ Medical Decision Making/ A&P   {   Click here for ABCD2, HEART and other calculatorsREFRESH Note before signing :1}  Medical Decision Making Amount and/or Complexity of Data Reviewed Labs: ordered. Radiology: ordered.  Risk Decision regarding hospitalization.   Patient will be admitted by cardiology  {Document critical care time when appropriate:1} {Document review of labs and clinical decision tools ie heart score, Chads2Vasc2 etc:1}  {Document your independent review of radiology images, and any outside records:1} {Document your discussion with family members, caretakers, and with consultants:1} {Document social determinants of health affecting pt's care:1} {Document your decision making why or why not admission, treatments were needed:1} Final Clinical  Impression(s) / ED Diagnoses Final diagnoses:  None    Rx / DC Orders ED Discharge Orders     None

## 2023-02-11 NOTE — ED Triage Notes (Signed)
Pt was sent by PCP loop recorder said pt had 17 sec incident. Pt's daughter doesn't remember what they said. Pt c/o int dizziness.

## 2023-02-11 NOTE — Telephone Encounter (Signed)
Pause alert received occurring @ 1129 on 8/17. Pause 17 seconds in duration. ILR implanted for syncope.   Left message on patients home phone to call back to assess for symptoms.

## 2023-02-11 NOTE — Telephone Encounter (Signed)
Spoke to patients son who is with patient.   Patient reports she was laying in bed watching tv. Denies any dizziness, lightheadedness or syncope. Patient reported of chest pain during the day. Patient denies taking bisoprolol per MAR.

## 2023-02-11 NOTE — Telephone Encounter (Signed)
Per Dr. Lalla Brothers, patient to go to ED in reference to pause.   Spoke to patients family Alexandra Berger) who advised patient was on the way to ED now for episode of dizziness as I had advised pt to go for any further symptoms including dizziness, lightheaded or syncope.   [name], patient does NOT need to drive. Alexandra Berger states his wife is driving pt.

## 2023-02-11 NOTE — Telephone Encounter (Signed)
Unable to assess for symptoms until we get into contact w/ patient or DPR. Routed to CL for review.

## 2023-02-12 ENCOUNTER — Telehealth: Payer: Self-pay | Admitting: Student

## 2023-02-12 ENCOUNTER — Inpatient Hospital Stay (HOSPITAL_COMMUNITY): Payer: Medicare HMO

## 2023-02-12 ENCOUNTER — Encounter (HOSPITAL_COMMUNITY)
Admission: EM | Disposition: A | Discharge status: Home / Self Care | Payer: Self-pay | Source: Home / Self Care | Attending: Cardiology

## 2023-02-12 ENCOUNTER — Other Ambulatory Visit: Payer: Self-pay

## 2023-02-12 DIAGNOSIS — I495 Sick sinus syndrome: Secondary | ICD-10-CM | POA: Diagnosis not present

## 2023-02-12 DIAGNOSIS — R9431 Abnormal electrocardiogram [ECG] [EKG]: Secondary | ICD-10-CM | POA: Diagnosis not present

## 2023-02-12 HISTORY — PX: LEFT HEART CATH AND CORONARY ANGIOGRAPHY: CATH118249

## 2023-02-12 LAB — BASIC METABOLIC PANEL
Anion gap: 7 (ref 5–15)
BUN: 20 mg/dL (ref 8–23)
CO2: 30 mmol/L (ref 22–32)
Calcium: 8.7 mg/dL — ABNORMAL LOW (ref 8.9–10.3)
Chloride: 101 mmol/L (ref 98–111)
Creatinine, Ser: 1 mg/dL (ref 0.44–1.00)
GFR, Estimated: 60 mL/min — ABNORMAL LOW (ref >60)
Glucose, Bld: 175 mg/dL — ABNORMAL HIGH (ref 70–99)
Potassium: 4.1 mmol/L (ref 3.5–5.1)
Sodium: 138 mmol/L (ref 135–145)

## 2023-02-12 LAB — ECHOCARDIOGRAM COMPLETE
Area-P 1/2: 3.28 cm2
Calc EF: 71.7 %
Height: 64 in
MV M vel: 3.49 m/s
MV Peak grad: 48.7 mmHg
S' Lateral: 2.2 cm
Single Plane A2C EF: 68.5 %
Single Plane A4C EF: 75.9 %
Weight: 2977.09 [oz_av]

## 2023-02-12 LAB — CBC
HCT: 38.5 % (ref 36.0–46.0)
Hemoglobin: 13 g/dL (ref 12.0–15.0)
MCH: 31.7 pg (ref 26.0–34.0)
MCHC: 33.8 g/dL (ref 30.0–36.0)
MCV: 93.9 fL (ref 80.0–100.0)
Platelets: 173 10*3/uL (ref 150–400)
RBC: 4.1 MIL/uL (ref 3.87–5.11)
RDW: 12.3 % (ref 11.5–15.5)
WBC: 5.6 10*3/uL (ref 4.0–10.5)
nRBC: 0 % (ref 0.0–0.2)

## 2023-02-12 SURGERY — LEFT HEART CATH AND CORONARY ANGIOGRAPHY
Anesthesia: LOCAL

## 2023-02-12 MED ORDER — ASPIRIN 81 MG PO CHEW
81.0000 mg | CHEWABLE_TABLET | ORAL | Status: AC
  Administered 2023-02-12: 81 mg via ORAL
  Filled 2023-02-12: qty 1

## 2023-02-12 MED ORDER — SODIUM CHLORIDE 0.9 % WEIGHT BASED INFUSION: 3.0000 mL/kg/h | INTRAVENOUS | Status: DC

## 2023-02-12 MED ORDER — LIDOCAINE HCL (PF) 1 % IJ SOLN
INTRAMUSCULAR | Status: DC | PRN
  Administered 2023-02-12: 5 mL via INTRADERMAL

## 2023-02-12 MED ORDER — IOHEXOL 350 MG/ML SOLN
INTRAVENOUS | Status: DC | PRN
  Administered 2023-02-12: 35 mL

## 2023-02-12 MED ORDER — HYDROXYZINE HCL 25 MG PO TABS
25.0000 mg | ORAL_TABLET | Freq: Every day | ORAL | Status: DC
  Administered 2023-02-12 – 2023-02-14 (×3): 25 mg via ORAL
  Filled 2023-02-12 (×3): qty 1

## 2023-02-12 MED ORDER — SODIUM CHLORIDE 0.9 % WEIGHT BASED INFUSION
3.0000 mL/kg/h | INTRAVENOUS | Status: DC
  Administered 2023-02-12: 3 mL/kg/h via INTRAVENOUS

## 2023-02-12 MED ORDER — SODIUM CHLORIDE 0.9% FLUSH
3.0000 mL | Freq: Two times a day (BID) | INTRAVENOUS | Status: DC
  Administered 2023-02-12 – 2023-02-14 (×3): 3 mL via INTRAVENOUS

## 2023-02-12 MED ORDER — SODIUM CHLORIDE 0.9 % IV SOLN: 250.0000 mL | INTRAVENOUS | Status: DC | PRN

## 2023-02-12 MED ORDER — SODIUM CHLORIDE 0.9 % IV SOLN: INTRAVENOUS | Status: AC

## 2023-02-12 MED ORDER — ATORVASTATIN CALCIUM 80 MG PO TABS
80.0000 mg | ORAL_TABLET | Freq: Every day | ORAL | Status: DC
  Administered 2023-02-12 – 2023-02-15 (×4): 80 mg via ORAL
  Filled 2023-02-12 (×4): qty 1

## 2023-02-12 MED ORDER — HEPARIN SODIUM (PORCINE) 1000 UNIT/ML IJ SOLN
INTRAMUSCULAR | Status: DC | PRN
  Administered 2023-02-12: 4000 [IU] via INTRAVENOUS

## 2023-02-12 MED ORDER — MIDAZOLAM HCL 2 MG/2ML IJ SOLN
INTRAMUSCULAR | Status: DC | PRN
  Administered 2023-02-12: 1 mg via INTRAVENOUS

## 2023-02-12 MED ORDER — SODIUM CHLORIDE 0.9% FLUSH: 3.0000 mL | INTRAVENOUS | Status: DC | PRN

## 2023-02-12 MED ORDER — FENTANYL CITRATE (PF) 100 MCG/2ML IJ SOLN
INTRAMUSCULAR | Status: AC
  Filled 2023-02-12: qty 2

## 2023-02-12 MED ORDER — FENTANYL CITRATE (PF) 100 MCG/2ML IJ SOLN
INTRAMUSCULAR | Status: DC | PRN
  Administered 2023-02-12: 25 ug via INTRAVENOUS

## 2023-02-12 MED ORDER — MIDAZOLAM HCL 2 MG/2ML IJ SOLN
INTRAMUSCULAR | Status: AC
  Filled 2023-02-12: qty 2

## 2023-02-12 MED ORDER — SODIUM CHLORIDE 0.9 % IV SOLN
INTRAVENOUS | Status: AC | PRN
  Administered 2023-02-12: 250 mL via INTRAVENOUS

## 2023-02-12 MED ORDER — ASPIRIN 81 MG PO CHEW: 81.0000 mg | CHEWABLE_TABLET | ORAL | Status: DC

## 2023-02-12 MED ORDER — SODIUM CHLORIDE 0.9 % WEIGHT BASED INFUSION: 1.0000 mL/kg/h | INTRAVENOUS | Status: DC

## 2023-02-12 MED ORDER — ONDANSETRON HCL 4 MG/2ML IJ SOLN: 4.0000 mg | Freq: Four times a day (QID) | INTRAMUSCULAR | Status: DC | PRN

## 2023-02-12 MED ORDER — VERAPAMIL HCL 2.5 MG/ML IV SOLN
INTRAVENOUS | Status: DC | PRN
  Administered 2023-02-12: 10 mL via INTRA_ARTERIAL

## 2023-02-12 SURGICAL SUPPLY — 9 items
CATH INFINITI 5FR JK (CATHETERS) IMPLANT
CATH INFINITI JR4 5F (CATHETERS) IMPLANT
DEVICE RAD COMP TR BAND LRG (VASCULAR PRODUCTS) IMPLANT
GLIDESHEATH SLEND SS 6F .021 (SHEATH) IMPLANT
GUIDEWIRE INQWIRE 1.5J.035X260 (WIRE) IMPLANT
INQWIRE 1.5J .035X260CM (WIRE) ×1
PACK CARDIAC CATHETERIZATION (CUSTOM PROCEDURE TRAY) ×1 IMPLANT
SET ATX-X65L (MISCELLANEOUS) IMPLANT
STOPCOCK MORSE 400PSI 3WAY (MISCELLANEOUS) IMPLANT

## 2023-02-12 NOTE — ED Notes (Addendum)
Pt is awake, a&ox4, pwd. Pt denies any complaints other than being hungry and wanting a coffee. Pt attached to monitor/vitals. Side rails up x 2, call light within reach. She is ambulatory and able to walk to restroom on her own. Family at the bedside.

## 2023-02-12 NOTE — Plan of Care (Signed)
  Problem: Education: Goal: Understanding of CV disease, CV risk reduction, and recovery process will improve Outcome: Progressing   Problem: Cardiovascular: Goal: Ability to achieve and maintain adequate cardiovascular perfusion will improve Outcome: Progressing   Problem: Health Behavior/Discharge Planning: Goal: Ability to safely manage health-related needs after discharge will improve Outcome: Progressing   

## 2023-02-12 NOTE — ED Notes (Signed)
ED TO INPATIENT HANDOFF REPORT  ED Nurse Name and Phone #:  Marisue Ivan [phone]  S Name/Age/Gender Nicholes Calamity 72 y.o. female Room/Bed: 043C/043C  Code Status   Code Status: Full Code  Home/SNF/Other Home Patient oriented to: self, place, time, and situation Is this baseline? Yes   Triage Complete: Triage complete  Chief Complaint Sinus node dysfunction (HCC) [I49.5]  Triage Note Pt was sent by PCP loop recorder said pt had 17 sec incident. Pt's daughter doesn't remember what they said. Pt c/o int dizziness.   Allergies Allergies  Allergen Reactions   Sulfa Antibiotics Anaphylaxis and Shortness Of Breath   Pravachol [Pravastatin] Other (See Comments)    Dizziness    Statins Other (See Comments)    Dizziness     Level of Care/Admitting Diagnosis ED Disposition     ED Disposition  Admit   Condition  --   Comment  Hospital Area: [facility] [[id number]]  Level of Care: Telemetry Cardiac [[id number]]  May admit patient to [facility] or [facility] if equivalent level of care is available:: No  Covid Evaluation: Asymptomatic - no recent exposure (last 10 days) testing not required  Diagnosis: Sinus node dysfunction [facility]) [[id number]]  Admitting Physician: Lanier Prude [[id number]]  Attending Physician: Lanier Prude [phone]  Certification:: I certify this patient will need inpatient services for at least 2 midnights  Expected Medical Readiness: 02/13/2023          B Medical/Surgery History Past Medical History:  Diagnosis Date   Anxiety and depression    Chronic back pain    Diabetes mellitus without complication (HCC)    DJD (degenerative joint disease)    Frequent PVCs    GERD (gastroesophageal reflux disease)    Hyperlipidemia    Hypertension    Memory difficulties    Syncope    Past Surgical History:  Procedure Laterality Date   ABDOMINAL HYSTERECTOMY     BRONCHIAL BIOPSY  08/29/2021   Procedure: BRONCHIAL BIOPSIES;  Surgeon: Josephine Igo, DO;  Location: [facility];  Service: Pulmonary;;   BRONCHIAL BRUSHINGS  08/29/2021   Procedure: BRONCHIAL BRUSHINGS;  Surgeon: Josephine Igo, DO;  Location: [facility];  Service: Pulmonary;;   BRONCHIAL NEEDLE ASPIRATION BIOPSY  08/29/2021   Procedure: BRONCHIAL NEEDLE ASPIRATION BIOPSIES;  Surgeon: Josephine Igo, DO;  Location: [facility];  Service: Pulmonary;;   CARDIAC CATHETERIZATION     2014   DILATION AND CURETTAGE OF UTERUS     EYE SURGERY Left    X 2   INTERCOSTAL NERVE BLOCK Left 09/28/2021   Procedure: INTERCOSTAL NERVE BLOCK;  Surgeon: Corliss Skains, MD;  Location: [facility] OR;  Service: Thoracic;  Laterality: Left;   LOOP RECORDER INSERTION N/A 08/14/2022   Procedure: LOOP RECORDER INSERTION;  Surgeon: Lanier Prude, MD;  Location: [facility] INVASIVE CV LAB;  Service: Cardiovascular;  Laterality: N/A;   LYMPH NODE DISSECTION Left 09/28/2021   Procedure: LYMPH NODE DISSECTION;  Surgeon: Corliss Skains, MD;  Location: [facility] OR;  Service: Thoracic;  Laterality: Left;   THORACENTESIS Left 11/17/2021   Procedure: Alanson Puls;  Surgeon: Josephine Igo, DO;  Location: [facility];  Service: Pulmonary;  Laterality: Left;   VIDEO BRONCHOSCOPY WITH RADIAL ENDOBRONCHIAL ULTRASOUND  08/29/2021   Procedure: RADIAL ENDOBRONCHIAL ULTRASOUND;  Surgeon: Josephine Igo, DO;  Location: [facility];  Service: Pulmonary;;     A IV Location/Drains/Wounds Patient Lines/Drains/Airways Status     Active Line/Drains/Airways     Name  Placement date Placement time Site Days   Peripheral IV 02/11/23 20 G 1.88" Left;Lateral Forearm 02/11/23  1847  Forearm  1            Intake/Output Last 24 hours No intake or output data in the 24 hours ending 02/12/23 1145  Labs/Imaging Results for orders placed or performed during the hospital encounter of 02/11/23 (from the past 48 hour(s))  Basic metabolic panel     Status: Abnormal   Collection Time: 02/11/23  1:34 PM  Result Value  Ref Range   Sodium 136 135 - 145 mmol/L   Potassium 3.7 3.5 - 5.1 mmol/L    Comment: HEMOLYSIS AT THIS LEVEL MAY AFFECT RESULT   Chloride 97 (L) 98 - 111 mmol/L   CO2 27 22 - 32 mmol/L   Glucose, Bld 228 (H) 70 - 99 mg/dL    Comment: Glucose reference range applies only to samples taken after fasting for at least 8 hours.   BUN 20 8 - 23 mg/dL   Creatinine, Ser 2.13 0.44 - 1.00 mg/dL   Calcium 9.2 8.9 - 08.6 mg/dL   GFR, Estimated >57 >84 mL/min    Comment: (NOTE) Calculated using the CKD-EPI Creatinine Equation (2021)    Anion gap 12 5 - 15    Comment: Performed at [facility], [address] [place], [place] [address]  CBC     Status: None   Collection Time: 02/11/23  1:34 PM  Result Value Ref Range   WBC 9.7 4.0 - 10.5 K/uL   RBC 4.70 3.87 - 5.11 MIL/uL   Hemoglobin 14.4 12.0 - 15.0 g/dL   HCT 52.8 41.3 - 24.4 %   MCV 93.0 80.0 - 100.0 fL   MCH 30.6 26.0 - 34.0 pg   MCHC 33.0 30.0 - 36.0 g/dL   RDW 01.0 27.2 - 53.6 %   Platelets 234 150 - 400 K/uL   nRBC 0.0 0.0 - 0.2 %    Comment: Performed at [facility], [address] [place], [place] [address]  Troponin I (High Sensitivity)     Status: None   Collection Time: 02/11/23  1:34 PM  Result Value Ref Range   Troponin I (High Sensitivity) 8 <18 ng/L    Comment: (NOTE) Elevated high sensitivity troponin I (hsTnI) values and significant  changes across serial measurements may suggest ACS but many other  chronic and acute conditions are known to elevate hsTnI results.  Refer to the "Links" section for chest pain algorithms and additional  guidance. Performed at [facility], [address] [place], [place] [address]   Troponin I (High Sensitivity)     Status: None   Collection Time: 02/11/23  7:45 PM  Result Value Ref Range   Troponin I (High Sensitivity) 6 <18 ng/L    Comment: (NOTE) Elevated high sensitivity troponin I (hsTnI) values and significant  changes across serial measurements may  suggest ACS but many other  chronic and acute conditions are known to elevate hsTnI results.  Refer to the "Links" section for chest pain algorithms and additional  guidance. Performed at [facility], [address], [place], [place] [address]   Creatinine, serum     Status: Abnormal   Collection Time: 02/11/23  7:45 PM  Result Value Ref Range   Creatinine, Ser 1.07 (H) 0.44 - 1.00 mg/dL   GFR, Estimated 55 (L) >60 mL/min    Comment: (NOTE) Calculated using the CKD-EPI Creatinine Equation (  2021) Performed at [facility], [address] [place], [place] [address]   Basic metabolic panel     Status: Abnormal   Collection Time: 02/12/23  2:33 AM  Result Value Ref Range   Sodium 138 135 - 145 mmol/L   Potassium 4.1 3.5 - 5.1 mmol/L   Chloride 101 98 - 111 mmol/L   CO2 30 22 - 32 mmol/L   Glucose, Bld 175 (H) 70 - 99 mg/dL    Comment: Glucose reference range applies only to samples taken after fasting for at least 8 hours.   BUN 20 8 - 23 mg/dL   Creatinine, Ser 6.64 0.44 - 1.00 mg/dL   Calcium 8.7 (L) 8.9 - 10.3 mg/dL   GFR, Estimated 60 (L) >60 mL/min    Comment: (NOTE) Calculated using the CKD-EPI Creatinine Equation (2021)    Anion gap 7 5 - 15    Comment: Performed at [facility], [address], [place], [place] [address]   ECHOCARDIOGRAM COMPLETE  Result Date: 02/12/2023    ECHOCARDIOGRAM REPORT   Patient Name:   HALEH ZELL Date of Exam: 02/12/2023 Medical Rec #:  [id number]            Height:       64.0 in Accession #:    [id number]           Weight:       186.1 lb Date of Birth:  Dec 09, 1950            BSA:          1.897 m Patient Age:    72 years             BP:           115/84 mmHg Patient Gender: F                    HR:           79 bpm. Exam Location:  Inpatient Procedure: 2D Echo, Cardiac Doppler and Color Doppler Indications:    Abnormal ECG  History:        Patient has prior history of Echocardiogram examinations, most                  recent 08/14/2022. Loop recorder, Arrythmias:PVC,                 Signs/Symptoms:Syncope; Risk Factors:Hypertension, Dyslipidemia                 and Diabetes.  Sonographer:    Milda Smart Referring Phys: [id number] RENEE LYNN URSUY  Sonographer Comments: Image acquisition challenging due to patient body habitus and Image acquisition challenging due to respiratory motion. IMPRESSIONS  1. Left ventricular ejection fraction, by estimation, is 65 to 70%. The left ventricle has normal function. The left ventricle has no regional wall motion abnormalities. There is moderate left ventricular hypertrophy of the basal-septal segment. Left ventricular diastolic parameters are indeterminate.  2. Right ventricular systolic function is normal. The right ventricular size is normal.  3. A small pericardial effusion is present. The pericardial effusion is localized near the right atrium and anterior to the right ventricle.  4. The mitral valve is normal in structure. Mild mitral valve regurgitation. No evidence of mitral stenosis.  5. The aortic valve is tricuspid. Aortic valve regurgitation is not visualized. Aortic valve sclerosis is present, with no evidence of aortic valve stenosis.  6. The inferior vena cava is normal  in size with greater than 50% respiratory variability, suggesting right atrial pressure of 3 mmHg. FINDINGS  Left Ventricle: Left ventricular ejection fraction, by estimation, is 65 to 70%. The left ventricle has normal function. The left ventricle has no regional wall motion abnormalities. The left ventricular internal cavity size was normal in size. There is  moderate left ventricular hypertrophy of the basal-septal segment. Left ventricular diastolic parameters are indeterminate. Normal left ventricular filling pressure. Right Ventricle: The right ventricular size is normal. No increase in right ventricular wall thickness. Right ventricular systolic function is normal. Left Atrium: Left atrial size was normal  in size. Right Atrium: Right atrial size was normal in size. Pericardium: A small pericardial effusion is present. The pericardial effusion is localized near the right atrium and anterior to the right ventricle. Mitral Valve: The mitral valve is normal in structure. Mild mitral valve regurgitation. No evidence of mitral valve stenosis. Tricuspid Valve: The tricuspid valve is normal in structure. Tricuspid valve regurgitation is mild . No evidence of tricuspid stenosis. Aortic Valve: The aortic valve is tricuspid. Aortic valve regurgitation is not visualized. Aortic valve sclerosis is present, with no evidence of aortic valve stenosis. Pulmonic Valve: The pulmonic valve was normal in structure. Pulmonic valve regurgitation is mild. No evidence of pulmonic stenosis. Aorta: The aortic root is normal in size and structure. Venous: The inferior vena cava is normal in size with greater than 50% respiratory variability, suggesting right atrial pressure of 3 mmHg. IAS/Shunts: No atrial level shunt detected by color flow Doppler.  LEFT VENTRICLE PLAX 2D LVIDd:         3.20 cm     Diastology LVIDs:         2.20 cm     LV e' medial:    4.57 cm/s LV PW:         0.90 cm     LV E/e' medial:  12.5 LV IVS:        1.30 cm     LV e' lateral:   5.77 cm/s LVOT diam:     1.70 cm     LV E/e' lateral: 9.9 LV SV:         44 LV SV Index:   23 LVOT Area:     2.27 cm  LV Volumes (MOD) LV vol d, MOD A2C: 37.2 ml LV vol d, MOD A4C: 39.8 ml LV vol s, MOD A2C: 11.7 ml LV vol s, MOD A4C: 9.6 ml LV SV MOD A2C:     25.5 ml LV SV MOD A4C:     39.8 ml LV SV MOD BP:      27.9 ml RIGHT VENTRICLE             IVC RV Basal diam:  2.90 cm     IVC diam: 1.20 cm RV S prime:     12.90 cm/s TAPSE (M-mode): 1.8 cm LEFT ATRIUM             Index        RIGHT ATRIUM          Index LA diam:        2.90 cm 1.53 cm/m   RA Area:     9.10 cm LA Vol (A2C):   36.0 ml 18.97 ml/m  RA Volume:   17.60 ml 9.28 ml/m LA Vol (A4C):   43.7 ml 23.03 ml/m LA Biplane Vol: 39.8  ml 20.98 ml/m  AORTIC VALVE LVOT Vmax:   102.00 cm/s LVOT Vmean:  65.500 cm/s LVOT VTI:    0.192 m  AORTA Ao Root diam: 2.70 cm Ao Asc diam:  3.30 cm MITRAL VALVE               TRICUSPID VALVE MV Area (PHT): 3.28 cm    TR Peak grad:   30.5 mmHg MV Decel Time: 231 msec    TR Mean grad:   19.0 mmHg MR Peak grad: 48.7 mmHg    TR Vmax:        276.00 cm/s MR Vmax:      349.00 cm/s  TR Vmean:       202.0 cm/s MV E velocity: 56.90 cm/s MV A velocity: 55.10 cm/s  SHUNTS MV E/A ratio:  1.03        Systemic VTI:  0.19 m                            Systemic Diam: 1.70 cm Armanda Magic MD Electronically signed by Armanda Magic MD Signature Date/Time: 02/12/2023/8:52:55 AM    Final    DG Chest 2 View  Result Date: 02/11/2023 CLINICAL DATA:  Chest pain. EXAM: CHEST - 2 VIEW COMPARISON:  August 12, 2022. FINDINGS: The heart size and mediastinal contours are within normal limits. Both lungs are clear. The visualized skeletal structures are unremarkable. IMPRESSION: No active cardiopulmonary disease. Electronically Signed   By: Lupita Raider M.D.   On: 02/11/2023 16:05    Pending Labs Unresulted Labs (From admission, onward)     Start     Ordered   02/11/23 1955  CBC  Once,   R        02/11/23 1955            Vitals/Pain Today's Vitals   02/12/23 1000 02/12/23 1029 02/12/23 1038 02/12/23 1144  BP: 96/61 96/61    Pulse: 75 70    Resp:  12    Temp:    97.9 F (36.6 C)  TempSrc:    Oral  SpO2: 96% 100%    Weight:      Height:      PainSc:   0-No pain     Isolation Precautions No active isolations  Medications Medications  nitroGLYCERIN (NITROSTAT) SL tablet 0.4 mg (has no administration in time range)  acetaminophen (TYLENOL) tablet 650 mg (650 mg Oral Given 02/11/23 2334)  heparin injection 5,000 Units (5,000 Units Subcutaneous Given 02/12/23 0537)  atorvastatin (LIPITOR) tablet 80 mg (80 mg Oral Given 02/12/23 1012)  hydrOXYzine (ATARAX) tablet 25 mg (has no administration in time range)   aspirin chewable tablet 81 mg (has no administration in time range)  0.9% sodium chloride infusion (has no administration in time range)    Followed by  0.9% sodium chloride infusion (has no administration in time range)  sodium chloride 0.9 % bolus 1,000 mL (0 mLs Intravenous Stopped 02/11/23 2145)    Mobility walks     Focused Assessments Cardiac Assessment Handoff:  Cardiac Rhythm: Normal sinus rhythm No results found for: "CKTOTAL", "CKMB", "CKMBINDEX", "TROPONINI" No results found for: "DDIMER" Does the Patient currently have chest pain? No    R Recommendations: See Admitting Provider Note  Report given to:   Additional Notes:  She remains NPO. She is getting very frustrated with this and wanting to know when she is going to the cath lab. Will sometimes say that she is going to sign herself out.

## 2023-02-12 NOTE — Interval H&P Note (Signed)
History and Physical Interval Note:  02/12/2023 4:11 PM  Alexandra Berger  has presented today for surgery, with the diagnosis of bradicardia - abnormal ekg.  The various methods of treatment have been discussed with the patient and family. After consideration of risks, benefits and other options for treatment, the patient has consented to  Procedure(s): LEFT HEART CATH AND CORONARY ANGIOGRAPHY (N/A) as a surgical intervention.  The patient's history has been reviewed, patient examined, no change in status, stable for surgery.  I have reviewed the patient's chart and labs.  Questions were answered to the patient's satisfaction.     Lorine Bears

## 2023-02-12 NOTE — Progress Notes (Signed)
RN removed TR band. Patient site is clean dry and intact with no complications, no bleedings and no signs of hematoma. Site is covered with gauze and tegaderm. Patient made RN aware that "thumb still feels tingling". RN paged on call cardiology NP to round. Patient shows no signs of poor circulation. RN educated patient and patient's daughter post tr band care and restrictions. Patient and patient's daughter verbalize understanding. On call cardiology NP to round to assess R radial site. RN informed oncoming CN and receiving RN.

## 2023-02-12 NOTE — Progress Notes (Signed)
  Echocardiogram 2D Echocardiogram has been performed.  Alexandra Berger 02/12/2023, 8:37 AM

## 2023-02-12 NOTE — Telephone Encounter (Signed)
Error

## 2023-02-12 NOTE — Progress Notes (Signed)
Rounding Note    Patient Name: Alexandra Berger Date of Encounter: 02/12/2023  [facility] Cardiologist: Nanetta Batty, MD   Subjective   NAEO, denies any symptoms  Inpatient Medications    Scheduled Meds:  [START ON 02/13/2023] aspirin  81 mg Oral Pre-Cath   atorvastatin  80 mg Oral Daily   heparin  5,000 Units Subcutaneous Q8H   hydrOXYzine  25 mg Oral QHS   Continuous Infusions:  [START ON 02/13/2023] sodium chloride     Followed by   Melene Muller ON 02/13/2023] sodium chloride     PRN Meds: acetaminophen, nitroGLYCERIN   Vital Signs    Vitals:   02/12/23 0500 02/12/23 0600 02/12/23 0700 02/12/23 0751  BP: 98/62 (!) 93/51 (!) 97/53   Pulse: 63 65 68   Resp: 11 (!) 8 12   Temp:    97.9 F (36.6 C)  TempSrc:    Oral  SpO2: 97% 98% 100%   Weight:      Height:       No intake or output data in the 24 hours ending 02/12/23 0901    02/11/2023    1:13 PM 10/09/2022    8:10 AM 08/14/2022    5:00 AM  Last 3 Weights  Weight (lbs) 186 lb 1.1 oz 186 lb 191 lb  Weight (kg) 84.4 kg 84.369 kg 86.637 kg      Telemetry    SR, no pausing, no brady - Personally Reviewed  ECG    No new EKGs - Personally Reviewed  Physical Exam   Examined by Dr. Lalla Brothers  GEN: No acute distress.   Neck: No JVD Cardiac: RRR, no murmurs, rubs, or gallops.  Respiratory: CTA b/l GI: Soft, nontender, non-distended  MS: No edema; No deformity. Neuro:  Nonfocal  Psych: Normal affect   Labs    High Sensitivity Troponin:   Recent Labs  Lab 02/11/23 1334 02/11/23 1945  TROPONINIHS 8 6     Chemistry Recent Labs  Lab 02/11/23 1334 02/11/23 1945 02/12/23 0233  NA 136  --  138  K 3.7  --  4.1  CL 97*  --  101  CO2 27  --  30  GLUCOSE 228*  --  175*  BUN 20  --  20  CREATININE 0.97 1.07* 1.00  CALCIUM 9.2  --  8.7*  GFRNONAA >60 55* 60*  ANIONGAP 12  --  7    Lipids No results for input(s): "CHOL", "TRIG", "HDL", "LABVLDL", "LDLCALC", "CHOLHDL" in the last 168  hours.  Hematology Recent Labs  Lab 02/11/23 1334  WBC 9.7  RBC 4.70  HGB 14.4  HCT 43.7  MCV 93.0  MCH 30.6  MCHC 33.0  RDW 12.3  PLT 234   Thyroid No results for input(s): "TSH", "FREET4" in the last 168 hours.  BNPNo results for input(s): "BNP", "PROBNP" in the last 168 hours.  DDimer No results for input(s): "DDIMER" in the last 168 hours.   Radiology    ECHOCARDIOGRAM COMPLETE  Result Date: 02/12/2023    ECHOCARDIOGRAM REPORT   Patient Name:   Alexandra Berger Date of Exam: 02/12/2023 Medical Rec #:  [id number]            Height:       64.0 in Accession #:    [id number]           Weight:       186.1 lb Date of Birth:  10/12/1950  BSA:          1.897 m Patient Age:    72 years             BP:           115/84 mmHg Patient Gender: F                    HR:           79 bpm. Exam Location:  Inpatient Procedure: 2D Echo, Cardiac Doppler and Color Doppler Indications:    Abnormal ECG  History:        Patient has prior history of Echocardiogram examinations, most                 recent 08/14/2022. Loop recorder, Arrythmias:PVC,                 Signs/Symptoms:Syncope; Risk Factors:Hypertension, Dyslipidemia                 and Diabetes.  Sonographer:    Milda Smart Referring Phys: [id number] Bryar Rennie LYNN Barbi Kumagai  Sonographer Comments: Image acquisition challenging due to patient body habitus and Image acquisition challenging due to respiratory motion. IMPRESSIONS  1. Left ventricular ejection fraction, by estimation, is 65 to 70%. The left ventricle has normal function. The left ventricle has no regional wall motion abnormalities. There is moderate left ventricular hypertrophy of the basal-septal segment. Left ventricular diastolic parameters are indeterminate.  2. Right ventricular systolic function is normal. The right ventricular size is normal.  3. A small pericardial effusion is present. The pericardial effusion is localized near the right atrium and anterior to the right ventricle.   4. The mitral valve is normal in structure. Mild mitral valve regurgitation. No evidence of mitral stenosis.  5. The aortic valve is tricuspid. Aortic valve regurgitation is not visualized. Aortic valve sclerosis is present, with no evidence of aortic valve stenosis.  6. The inferior vena cava is normal in size with greater than 50% respiratory variability, suggesting right atrial pressure of 3 mmHg. FINDINGS  Left Ventricle: Left ventricular ejection fraction, by estimation, is 65 to 70%. The left ventricle has normal function. The left ventricle has no regional wall motion abnormalities. The left ventricular internal cavity size was normal in size. There is  moderate left ventricular hypertrophy of the basal-septal segment. Left ventricular diastolic parameters are indeterminate. Normal left ventricular filling pressure. Right Ventricle: The right ventricular size is normal. No increase in right ventricular wall thickness. Right ventricular systolic function is normal. Left Atrium: Left atrial size was normal in size. Right Atrium: Right atrial size was normal in size. Pericardium: A small pericardial effusion is present. The pericardial effusion is localized near the right atrium and anterior to the right ventricle. Mitral Valve: The mitral valve is normal in structure. Mild mitral valve regurgitation. No evidence of mitral valve stenosis. Tricuspid Valve: The tricuspid valve is normal in structure. Tricuspid valve regurgitation is mild . No evidence of tricuspid stenosis. Aortic Valve: The aortic valve is tricuspid. Aortic valve regurgitation is not visualized. Aortic valve sclerosis is present, with no evidence of aortic valve stenosis. Pulmonic Valve: The pulmonic valve was normal in structure. Pulmonic valve regurgitation is mild. No evidence of pulmonic stenosis. Aorta: The aortic root is normal in size and structure. Venous: The inferior vena cava is normal in size with greater than 50% respiratory  variability, suggesting right atrial pressure of 3 mmHg. IAS/Shunts: No atrial level shunt detected by color flow  Doppler.  LEFT VENTRICLE PLAX 2D LVIDd:         3.20 cm     Diastology LVIDs:         2.20 cm     LV e' medial:    4.57 cm/s LV PW:         0.90 cm     LV E/e' medial:  12.5 LV IVS:        1.30 cm     LV e' lateral:   5.77 cm/s LVOT diam:     1.70 cm     LV E/e' lateral: 9.9 LV SV:         44 LV SV Index:   23 LVOT Area:     2.27 cm  LV Volumes (MOD) LV vol d, MOD A2C: 37.2 ml LV vol d, MOD A4C: 39.8 ml LV vol s, MOD A2C: 11.7 ml LV vol s, MOD A4C: 9.6 ml LV SV MOD A2C:     25.5 ml LV SV MOD A4C:     39.8 ml LV SV MOD BP:      27.9 ml RIGHT VENTRICLE             IVC RV Basal diam:  2.90 cm     IVC diam: 1.20 cm RV S prime:     12.90 cm/s TAPSE (M-mode): 1.8 cm LEFT ATRIUM             Index        RIGHT ATRIUM          Index LA diam:        2.90 cm 1.53 cm/m   RA Area:     9.10 cm LA Vol (A2C):   36.0 ml 18.97 ml/m  RA Volume:   17.60 ml 9.28 ml/m LA Vol (A4C):   43.7 ml 23.03 ml/m LA Biplane Vol: 39.8 ml 20.98 ml/m  AORTIC VALVE LVOT Vmax:   102.00 cm/s LVOT Vmean:  65.500 cm/s LVOT VTI:    0.192 m  AORTA Ao Root diam: 2.70 cm Ao Asc diam:  3.30 cm MITRAL VALVE               TRICUSPID VALVE MV Area (PHT): 3.28 cm    TR Peak grad:   30.5 mmHg MV Decel Time: 231 msec    TR Mean grad:   19.0 mmHg MR Peak grad: 48.7 mmHg    TR Vmax:        276.00 cm/s MR Vmax:      349.00 cm/s  TR Vmean:       202.0 cm/s MV E velocity: 56.90 cm/s MV A velocity: 55.10 cm/s  SHUNTS MV E/A ratio:  1.03        Systemic VTI:  0.19 m                            Systemic Diam: 1.70 cm Armanda Magic MD Electronically signed by Armanda Magic MD Signature Date/Time: 02/12/2023/8:52:55 AM    Final    DG Chest 2 View  Result Date: 02/11/2023 CLINICAL DATA:  Chest pain. EXAM: CHEST - 2 VIEW COMPARISON:  August 12, 2022. FINDINGS: The heart size and mediastinal contours are within normal limits. Both lungs are clear. The  visualized skeletal structures are unremarkable. IMPRESSION: No active cardiopulmonary disease. Electronically Signed   By: Lupita Raider M.D.   On: 02/11/2023 16:05    Cardiac Studies    02/12/23:  TTE  1. Left ventricular ejection fraction, by estimation, is 65 to 70%. The  left ventricle has normal function. The left ventricle has no regional  wall motion abnormalities. There is moderate left ventricular hypertrophy  of the basal-septal segment. Left  ventricular diastolic parameters are indeterminate.   2. Right ventricular systolic function is normal. The right ventricular  size is normal.   3. A small pericardial effusion is present. The pericardial effusion is  localized near the right atrium and anterior to the right ventricle.   4. The mitral valve is normal in structure. Mild mitral valve  regurgitation. No evidence of mitral stenosis.   5. The aortic valve is tricuspid. Aortic valve regurgitation is not  visualized. Aortic valve sclerosis is present, with no evidence of aortic  valve stenosis.   6. The inferior vena cava is normal in size with greater than 50%  respiratory variability, suggesting right atrial pressure of 3 mmHg.   08/14/22: TTE  1. Left ventricular ejection fraction, by estimation, is 60 to 65%. The  left ventricle has normal function. The left ventricle has no regional  wall motion abnormalities. Left ventricular diastolic parameters are  indeterminate. Elevated left atrial  pressure.   2. The mitral valve is abnormal. Moderate mitral valve regurgitation.   3. Tricuspid valve regurgitation is moderate.   4. Right ventricular systolic function is normal. The right ventricular  size is normal. There is normal pulmonary artery systolic pressure.   5. Moderate pericardial effusion. The pericardial effusion is anterior to  the right ventricle and localized near the right atrium. No evidence of  increased pericardial pressure   6. The aortic valve is  tricuspid. Aortic valve regurgitation is not  visualized.   Comparison(s): Prior images reviewed side by side. Compared to prior,  mitral regurgitation and tricuspid regurgitation have increased.  Pericardial effusion is similar but predominantly RV anterior in nature.      06/12/21: Coronary Ca++ IMPRESSION: 1. Coronary calcium score of 158. This was 78th percentile for age-, race-, and sex-matched controls. 2. Dilated main pulmonary artery to 35 mm, suggestive of pulmonary hypertension. 3. Aortic atherosclerosis. 4. Aortic and mitral annular calcification.     Dec-Jan 2023, monitor 1. NSR/SB/ST 2. Freq PVCs and short runs of NSVT 3. Runs of SVT 4. Needs ROV to discuss   In review of report PVC burden was 3%    Patient Profile     72 y.o. 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 Abnormal EKG No CP HS trop neg x2   Updated echo >> LVEF remains preserved,  no WMA Given bradycardia defer coronary CT not wanting to giove BB for the scan Dr. Lalla Brothers has seen the patient this morning and discussed recommendation for LHC in setting of new EKG changes Discussed procedure, potential risks, she is agreeable    3. DM Diet controlled No meds    4. Anxiety Home hyroxyzine   For questions or updates, please contact [facility] Please consult  [email or web address] for contact info under        Signed, Sheilah Pigeon, PA-C  02/12/2023, 9:01 AM

## 2023-02-12 NOTE — Progress Notes (Signed)
RN rounded on patient to removed first 3cc of air off TR band. Patient notified RN of thumb "tinglingness", pins and needles on fingertips of the r hand. Patient pulse +2. RN removed 3cc of air off TR band and notified cath lab. RN instructed to remove 3cc q15 mins if able. Patient TR band and site is clean dry and intact with no bleeding and no signs of hematoma. RN to continue to remove air if no complications.

## 2023-02-12 NOTE — Progress Notes (Signed)
    Subjective    Patient status post diagnostic catheterization earlier today and reported numbness to the right thumb while TR band was in place.  Following hemostasis removal of TR band, patient continued to complain of numbness to the right thumb.  She is able to move her thumb normally and upon my arrival, notes that it is only the distal right thumb that feels numb, though this area currently has a pulse oximeter sticker in place.  Vital signs and pulse ox have been normal.  Objective    Vitals:   02/12/23 1800 02/12/23 1926  BP: 103/60 105/67  Pulse: 72 82  Resp:  16  Temp:  98.3 F (36.8 C)  SpO2: 97% 100%   Pleasant, NAD, AAOx3.  Full range of motion of the right thumb and use of hand.  Normal capillary refill.  Right radial ulnar 2+ and equal bilaterally.  Normal reverse Allen's test.  Normal sensation up to the interphalangeal joint with numbness more distal.  Right radial site is without bleeding, bruit, or hematoma.  Lab Results  Component Value Date   WBC 5.6 02/12/2023   HGB 13.0 02/12/2023   HCT 38.5 02/12/2023   MCV 93.9 02/12/2023   PLT 173 02/12/2023    Lab Results  Component Value Date   CREATININE 1.00 02/12/2023   BUN 20 02/12/2023   NA 138 02/12/2023   K 4.1 02/12/2023   CL 101 02/12/2023   CO2 30 02/12/2023   Recent Labs  Lab 02/11/23 1334 02/11/23 1945  TROPONINIHS 8 6      Assessment & Plan    1.  Numbness of the right thumb status post radial arterial stick/cardiac catheterization: As above, mild numbness of the distal right thumb beyond the interphalangeal joint.  Exam otherwise normal without evidence of bleeding, bruit, or hematoma.  Good flow to the distal vascular beds with normal reverse Allen's test.  Suspect inflammation due to radial stick is causing compression of the nerve.  Watch overnight and consider ultrasound if symptoms worsen overnight or persist in the morning.  Nicolasa Ducking, NP 02/12/2023, 8:31 PM

## 2023-02-13 ENCOUNTER — Encounter (HOSPITAL_COMMUNITY): Payer: Self-pay | Admitting: Cardiovascular Disease

## 2023-02-13 DIAGNOSIS — I495 Sick sinus syndrome: Secondary | ICD-10-CM | POA: Diagnosis not present

## 2023-02-13 LAB — SURGICAL PCR SCREEN
MRSA, PCR: NEGATIVE
Staphylococcus aureus: NEGATIVE

## 2023-02-13 MED ORDER — POLYVINYL ALCOHOL 1.4 % OP SOLN: 1.0000 [drp] | OPHTHALMIC | Status: DC | PRN

## 2023-02-13 MED ORDER — SODIUM CHLORIDE 0.9% FLUSH
3.0000 mL | Freq: Two times a day (BID) | INTRAVENOUS | Status: DC
  Administered 2023-02-13 – 2023-02-15 (×4): 3 mL via INTRAVENOUS

## 2023-02-13 NOTE — Progress Notes (Signed)
Patient has signed consent form for PPM; placed in shadow chart.

## 2023-02-13 NOTE — Progress Notes (Signed)
Rounding Note    Patient Name: Alexandra Berger Date of Encounter: 02/13/2023  [facility] Cardiologist: Nanetta Batty, MD   Subjective   NAEO, denies any symptoms  Inpatient Medications    Scheduled Meds:  atorvastatin  80 mg Oral Daily   heparin  5,000 Units Subcutaneous Q8H   hydrOXYzine  25 mg Oral QHS   sodium chloride flush  3 mL Intravenous Q12H   Continuous Infusions:  sodium chloride     PRN Meds: sodium chloride, acetaminophen, nitroGLYCERIN, ondansetron (ZOFRAN) IV, sodium chloride flush   Vital Signs    Vitals:   02/12/23 1926 02/13/23 0025 02/13/23 0514 02/13/23 0801  BP: 105/67 (!) 108/55 (!) 101/55 95/74  Pulse: 82 71 67 66  Resp: 16 16 16 18   Temp: 98.3 F (36.8 C) 98.2 F (36.8 C) 98.2 F (36.8 C) 98 F (36.7 C)  TempSrc: Oral Oral Oral Oral  SpO2: 100% 96% 97% 96%  Weight:   83.4 kg   Height:        Intake/Output Summary (Last 24 hours) at 02/13/2023 0948 Last data filed at 02/12/2023 2346 Gross per 24 hour  Intake 1009.47 ml  Output 301 ml  Net 708.47 ml      02/13/2023    5:14 AM 02/12/2023    1:40 PM 02/11/2023    1:13 PM  Last 3 Weights  Weight (lbs) 183 lb 13.8 oz 177 lb 4 oz 186 lb 1.1 oz  Weight (kg) 83.4 kg 80.4 kg 84.4 kg      Telemetry    SR, no pausing, no brady - Personally Reviewed  ECG    No new EKGs - Personally Reviewed  Physical Exam   Seen and examined by Dr. Lalla Brothers  GEN: No acute distress.   Neck: No JVD Cardiac: RRR, no murmurs, rubs, or gallops.  Respiratory: CTA b/l GI: Soft, nontender, non-distended  MS: No edema; No deformity. Neuro:  Nonfocal  Psych: Normal affect   Labs    High Sensitivity Troponin:   Recent Labs  Lab 02/11/23 1334 02/11/23 1945  TROPONINIHS 8 6     Chemistry Recent Labs  Lab 02/11/23 1334 02/11/23 1945 02/12/23 0233  NA 136  --  138  K 3.7  --  4.1  CL 97*  --  101  CO2 27  --  30  GLUCOSE 228*  --  175*  BUN 20  --  20  CREATININE 0.97  1.07* 1.00  CALCIUM 9.2  --  8.7*  GFRNONAA >60 55* 60*  ANIONGAP 12  --  7    Lipids No results for input(s): "CHOL", "TRIG", "HDL", "LABVLDL", "LDLCALC", "CHOLHDL" in the last 168 hours.  Hematology Recent Labs  Lab 02/11/23 1334 02/12/23 1420  WBC 9.7 5.6  RBC 4.70 4.10  HGB 14.4 13.0  HCT 43.7 38.5  MCV 93.0 93.9  MCH 30.6 31.7  MCHC 33.0 33.8  RDW 12.3 12.3  PLT 234 173   Thyroid No results for input(s): "TSH", "FREET4" in the last 168 hours.  BNPNo results for input(s): "BNP", "PROBNP" in the last 168 hours.  DDimer No results for input(s): "DDIMER" in the last 168 hours.   Radiology      Cardiac Studies   02/12/23: LHC   Prox LAD lesion is 20% stenosed.   2nd Diag lesion is 20% stenosed.   Ost RCA to Prox RCA lesion is 20% stenosed.   1.  Mild nonobstructive coronary artery disease. 2.  Left ventricular angiography  was not performed.  EF was normal by echo. 3.  Normal left ventricular end-diastolic pressure.   Recommendations: Medical therapy. Proceed with the EP plans for pacemaker placement.   02/12/23: TTE  1. Left ventricular ejection fraction, by estimation, is 65 to 70%. The  left ventricle has normal function. The left ventricle has no regional  wall motion abnormalities. There is moderate left ventricular hypertrophy  of the basal-septal segment. Left  ventricular diastolic parameters are indeterminate.   2. Right ventricular systolic function is normal. The right ventricular  size is normal.   3. A small pericardial effusion is present. The pericardial effusion is  localized near the right atrium and anterior to the right ventricle.   4. The mitral valve is normal in structure. Mild mitral valve  regurgitation. No evidence of mitral stenosis.   5. The aortic valve is tricuspid. Aortic valve regurgitation is not  visualized. Aortic valve sclerosis is present, with no evidence of aortic  valve stenosis.   6. The inferior vena cava is normal in  size with greater than 50%  respiratory variability, suggesting right atrial pressure of 3 mmHg.   08/14/22: TTE  1. Left ventricular ejection fraction, by estimation, is 60 to 65%. The  left ventricle has normal function. The left ventricle has no regional  wall motion abnormalities. Left ventricular diastolic parameters are  indeterminate. Elevated left atrial  pressure.   2. The mitral valve is abnormal. Moderate mitral valve regurgitation.   3. Tricuspid valve regurgitation is moderate.   4. Right ventricular systolic function is normal. The right ventricular  size is normal. There is normal pulmonary artery systolic pressure.   5. Moderate pericardial effusion. The pericardial effusion is anterior to  the right ventricle and localized near the right atrium. No evidence of  increased pericardial pressure   6. The aortic valve is tricuspid. Aortic valve regurgitation is not  visualized.   Comparison(s): Prior images reviewed side by side. Compared to prior,  mitral regurgitation and tricuspid regurgitation have increased.  Pericardial effusion is similar but predominantly RV anterior in nature.      06/12/21: Coronary Ca++ IMPRESSION: 1. Coronary calcium score of 158. This was 78th percentile for age-, race-, and sex-matched controls. 2. Dilated main pulmonary artery to 35 mm, suggestive of pulmonary hypertension. 3. Aortic atherosclerosis. 4. Aortic and mitral annular calcification.     Dec-Jan 2023, monitor 1. NSR/SB/ST 2. Freq PVCs and short runs of NSVT 3. Runs of SVT 4. Needs ROV to discuss   In review of report PVC burden was 3%    Patient Profile     72 y.o. 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 Abnormal EKG No CP HS trop neg x2   Updated echo >> LVEF remains preserved,  no WMA Cath with no obstructive disease    3. DM Diet controlled No meds    4. Anxiety Home hyroxyzine   For questions or updates, please contact [facility] Please  consult [email or web address] for contact info under        Signed, Sheilah Pigeon, PA-C  02/13/2023, 9:48 AM

## 2023-02-13 NOTE — Progress Notes (Signed)
Patient declines to sign consent form for PPM. States she still has questions and may want to decide to not go through with it. EP notified via AMION.

## 2023-02-13 NOTE — Plan of Care (Signed)

## 2023-02-14 ENCOUNTER — Other Ambulatory Visit: Payer: Self-pay

## 2023-02-14 ENCOUNTER — Inpatient Hospital Stay (HOSPITAL_COMMUNITY)
Admission: EM | Disposition: A | Discharge status: Home / Self Care | Payer: Self-pay | Source: Home / Self Care | Attending: Cardiology

## 2023-02-14 DIAGNOSIS — I442 Atrioventricular block, complete: Secondary | ICD-10-CM

## 2023-02-14 HISTORY — PX: PACEMAKER IMPLANT: EP1218

## 2023-02-14 HISTORY — PX: LOOP RECORDER REMOVAL: EP1215

## 2023-02-14 LAB — COMPREHENSIVE METABOLIC PANEL
ALT: 19 U/L (ref 0–44)
AST: 16 U/L (ref 15–41)
Albumin: 3 g/dL — ABNORMAL LOW (ref 3.5–5.0)
Alkaline Phosphatase: 52 U/L (ref 38–126)
Anion gap: 9 (ref 5–15)
BUN: 13 mg/dL (ref 8–23)
CO2: 27 mmol/L (ref 22–32)
Calcium: 8.8 mg/dL — ABNORMAL LOW (ref 8.9–10.3)
Chloride: 102 mmol/L (ref 98–111)
Creatinine, Ser: 0.9 mg/dL (ref 0.44–1.00)
GFR, Estimated: >60 mL/min (ref >60)
Glucose, Bld: 141 mg/dL — ABNORMAL HIGH (ref 70–99)
Potassium: 3.4 mmol/L — ABNORMAL LOW (ref 3.5–5.1)
Sodium: 138 mmol/L (ref 135–145)
Total Bilirubin: 0.7 mg/dL (ref 0.3–1.2)
Total Protein: 5.1 g/dL — ABNORMAL LOW (ref 6.5–8.1)

## 2023-02-14 LAB — CBC
HCT: 35.5 % — ABNORMAL LOW (ref 36.0–46.0)
Hemoglobin: 12.2 g/dL (ref 12.0–15.0)
MCH: 31.8 pg (ref 26.0–34.0)
MCHC: 34.4 g/dL (ref 30.0–36.0)
MCV: 92.4 fL (ref 80.0–100.0)
Platelets: 145 10*3/uL — ABNORMAL LOW (ref 150–400)
RBC: 3.84 MIL/uL — ABNORMAL LOW (ref 3.87–5.11)
RDW: 12.3 % (ref 11.5–15.5)
WBC: 4.1 10*3/uL (ref 4.0–10.5)
nRBC: 0 % (ref 0.0–0.2)

## 2023-02-14 LAB — MAGNESIUM: Magnesium: 1.8 mg/dL (ref 1.7–2.4)

## 2023-02-14 SURGERY — PACEMAKER IMPLANT

## 2023-02-14 MED ORDER — FENTANYL CITRATE (PF) 100 MCG/2ML IJ SOLN
INTRAMUSCULAR | Status: DC | PRN
  Administered 2023-02-14 (×2): 12.5 ug via INTRAVENOUS

## 2023-02-14 MED ORDER — MIDAZOLAM HCL 5 MG/5ML IJ SOLN
INTRAMUSCULAR | Status: DC | PRN
  Administered 2023-02-14 (×2): .5 mg via INTRAVENOUS

## 2023-02-14 MED ORDER — CHLORHEXIDINE GLUCONATE 4 % EX SOLN: 60.0000 mL | Freq: Once | CUTANEOUS | Status: DC

## 2023-02-14 MED ORDER — FENTANYL CITRATE (PF) 100 MCG/2ML IJ SOLN
INTRAMUSCULAR | Status: AC
  Filled 2023-02-14: qty 2

## 2023-02-14 MED ORDER — LIDOCAINE HCL (PF) 1 % IJ SOLN
INTRAMUSCULAR | Status: DC | PRN
  Administered 2023-02-14: 60 mL
  Administered 2023-02-14: 10 mL

## 2023-02-14 MED ORDER — SODIUM CHLORIDE 0.9 % IV SOLN
INTRAVENOUS | Status: AC
  Filled 2023-02-14: qty 2

## 2023-02-14 MED ORDER — MIDAZOLAM HCL 5 MG/5ML IJ SOLN
INTRAMUSCULAR | Status: AC
  Filled 2023-02-14: qty 5

## 2023-02-14 MED ORDER — SODIUM CHLORIDE 0.9% FLUSH: 3.0000 mL | INTRAVENOUS | Status: DC | PRN

## 2023-02-14 MED ORDER — SODIUM CHLORIDE 0.9 % IV SOLN: INTRAVENOUS | Status: DC

## 2023-02-14 MED ORDER — HEPARIN (PORCINE) IN NACL 1000-0.9 UT/500ML-% IV SOLN
INTRAVENOUS | Status: DC | PRN
  Administered 2023-02-14: 500 mL

## 2023-02-14 MED ORDER — CEFAZOLIN SODIUM-DEXTROSE 2-4 GM/100ML-% IV SOLN
INTRAVENOUS | Status: AC
  Filled 2023-02-14: qty 100

## 2023-02-14 MED ORDER — IOHEXOL 350 MG/ML SOLN
INTRAVENOUS | Status: DC | PRN
  Administered 2023-02-14: 10 mL

## 2023-02-14 MED ORDER — SODIUM CHLORIDE 0.9 % IV SOLN
80.0000 mg | INTRAVENOUS | Status: AC
  Administered 2023-02-14: 80 mg

## 2023-02-14 MED ORDER — CHLORHEXIDINE GLUCONATE 4 % EX SOLN: 60.0000 mL | Freq: Once | CUTANEOUS | Status: AC

## 2023-02-14 MED ORDER — CHLORHEXIDINE GLUCONATE 4 % EX SOLN
CUTANEOUS | Status: AC
  Administered 2023-02-14: 4 via TOPICAL
  Filled 2023-02-14: qty 60

## 2023-02-14 MED ORDER — POTASSIUM CHLORIDE CRYS ER 20 MEQ PO TBCR
40.0000 meq | EXTENDED_RELEASE_TABLET | Freq: Once | ORAL | Status: AC
  Administered 2023-02-14: 40 meq via ORAL
  Filled 2023-02-14: qty 2

## 2023-02-14 MED ORDER — CEFAZOLIN SODIUM-DEXTROSE 2-4 GM/100ML-% IV SOLN
2.0000 g | INTRAVENOUS | Status: AC
  Administered 2023-02-14: 2 g via INTRAVENOUS

## 2023-02-14 MED ORDER — SODIUM CHLORIDE 0.9 % IV SOLN: 250.0000 mL | INTRAVENOUS | Status: DC

## 2023-02-14 MED ORDER — LIDOCAINE HCL 1 % IJ SOLN
INTRAMUSCULAR | Status: AC
  Filled 2023-02-14: qty 60

## 2023-02-14 SURGICAL SUPPLY — 13 items
CABLE SURGICAL S-101-97-12 (CABLE) ×1 IMPLANT
CATH RIGHTSITE C315HIS02 (CATHETERS) IMPLANT
IPG PACE AZUR XT DR MRI W1DR01 (Pacemaker) IMPLANT
LEAD CAPSURE NOVUS 5076-52CM (Lead) IMPLANT
LEAD SELECT SECURE 3830 383069 (Lead) IMPLANT
PACE AZURE XT DR MRI W1DR01 (Pacemaker) ×1 IMPLANT
PAD DEFIB RADIO PHYSIO CONN (PAD) ×1 IMPLANT
SELECT SECURE 3830 383069 (Lead) ×1 IMPLANT
SHEATH 7FR PRELUDE SNAP 13 (SHEATH) IMPLANT
SHEATH PROBE COVER 6X72 (BAG) IMPLANT
SLITTER 6232ADJ (MISCELLANEOUS) IMPLANT
TRAY PACEMAKER INSERTION (PACKS) ×1 IMPLANT
WIRE HI TORQ VERSACORE-J 145CM (WIRE) IMPLANT

## 2023-02-14 NOTE — Progress Notes (Signed)
Rounding Note    Patient Name: Alexandra Berger Date of Encounter: 02/14/2023  [facility] Cardiologist: Nanetta Batty, MD   Subjective   NAEO, denies any symptoms, very much want to go home, agreeable to stay for PPM with anticipated d/c tomorrow  Inpatient Medications    Scheduled Meds:  atorvastatin  80 mg Oral Daily   gentamicin (GARAMYCIN) 80 mg in sodium chloride 0.9 % 500 mL irrigation  80 mg Irrigation To SSTC   heparin  5,000 Units Subcutaneous Q8H   hydrOXYzine  25 mg Oral QHS   sodium chloride flush  3 mL Intravenous Q12H   sodium chloride flush  3 mL Intravenous Q12H   Continuous Infusions:  sodium chloride     sodium chloride 50 mL/hr at 02/14/23 0647   sodium chloride      ceFAZolin (ANCEF) IV     PRN Meds: sodium chloride, acetaminophen, nitroGLYCERIN, ondansetron (ZOFRAN) IV, polyvinyl alcohol, sodium chloride flush, sodium chloride flush   Vital Signs    Vitals:   02/13/23 1930 02/14/23 0040 02/14/23 0430 02/14/23 0734  BP: (!) 110/48 114/72 (!) 101/50 (!) 112/49  Pulse: 82 76 75 73  Resp: 16 18 16 19   Temp: 97.9 F (36.6 C) 98.1 F (36.7 C) 98 F (36.7 C) 98.1 F (36.7 C)  TempSrc: Oral Oral Oral Oral  SpO2: 99% 99% 98% 100%  Weight:   80.9 kg   Height:        Intake/Output Summary (Last 24 hours) at 02/14/2023 0854 Last data filed at 02/13/2023 1700 Gross per 24 hour  Intake 486 ml  Output --  Net 486 ml      02/14/2023    4:30 AM 02/13/2023    5:14 AM 02/12/2023    1:40 PM  Last 3 Weights  Weight (lbs) 178 lb 5.6 oz 183 lb 13.8 oz 177 lb 4 oz  Weight (kg) 80.9 kg 83.4 kg 80.4 kg      Telemetry    SR, no pausing, no brady - Personally Reviewed  ECG    No new EKGs - Personally Reviewed  Physical Exam   Exam is unchanged GEN: No acute distress.   Neck: No JVD Cardiac: RRR, no murmurs, rubs, or gallops.  Respiratory: CTA b/l GI: Soft, nontender, non-distended  MS: No edema; No deformity. Neuro:  Nonfocal   Psych: Normal affect   Labs    High Sensitivity Troponin:   Recent Labs  Lab 02/11/23 1334 02/11/23 1945  TROPONINIHS 8 6     Chemistry Recent Labs  Lab 02/11/23 1334 02/11/23 1945 02/12/23 0233 02/14/23 0628  NA 136  --  138 138  K 3.7  --  4.1 3.4*  CL 97*  --  101 102  CO2 27  --  30 27  GLUCOSE 228*  --  175* 141*  BUN 20  --  20 13  CREATININE 0.97 1.07* 1.00 0.90  CALCIUM 9.2  --  8.7* 8.8*  MG  --   --   --  1.8  PROT  --   --   --  5.1*  ALBUMIN  --   --   --  3.0*  AST  --   --   --  16  ALT  --   --   --  19  ALKPHOS  --   --   --  52  BILITOT  --   --   --  0.7  GFRNONAA >60 55* 60* >60  ANIONGAP 12  --  7 9    Lipids No results for input(s): "CHOL", "TRIG", "HDL", "LABVLDL", "LDLCALC", "CHOLHDL" in the last 168 hours.  Hematology Recent Labs  Lab 02/11/23 1334 02/12/23 1420 02/14/23 0628  WBC 9.7 5.6 4.1  RBC 4.70 4.10 3.84*  HGB 14.4 13.0 12.2  HCT 43.7 38.5 35.5*  MCV 93.0 93.9 92.4  MCH 30.6 31.7 31.8  MCHC 33.0 33.8 34.4  RDW 12.3 12.3 12.3  PLT 234 173 145*   Thyroid No results for input(s): "TSH", "FREET4" in the last 168 hours.  BNPNo results for input(s): "BNP", "PROBNP" in the last 168 hours.  DDimer No results for input(s): "DDIMER" in the last 168 hours.   Radiology      Cardiac Studies   02/12/23: LHC   Prox LAD lesion is 20% stenosed.   2nd Diag lesion is 20% stenosed.   Ost RCA to Prox RCA lesion is 20% stenosed.   1.  Mild nonobstructive coronary artery disease. 2.  Left ventricular angiography was not performed.  EF was normal by echo. 3.  Normal left ventricular end-diastolic pressure.   Recommendations: Medical therapy. Proceed with the EP plans for pacemaker placement.   02/12/23: TTE  1. Left ventricular ejection fraction, by estimation, is 65 to 70%. The  left ventricle has normal function. The left ventricle has no regional  wall motion abnormalities. There is moderate left ventricular hypertrophy  of  the basal-septal segment. Left  ventricular diastolic parameters are indeterminate.   2. Right ventricular systolic function is normal. The right ventricular  size is normal.   3. A small pericardial effusion is present. The pericardial effusion is  localized near the right atrium and anterior to the right ventricle.   4. The mitral valve is normal in structure. Mild mitral valve  regurgitation. No evidence of mitral stenosis.   5. The aortic valve is tricuspid. Aortic valve regurgitation is not  visualized. Aortic valve sclerosis is present, with no evidence of aortic  valve stenosis.   6. The inferior vena cava is normal in size with greater than 50%  respiratory variability, suggesting right atrial pressure of 3 mmHg.   08/14/22: TTE  1. Left ventricular ejection fraction, by estimation, is 60 to 65%. The  left ventricle has normal function. The left ventricle has no regional  wall motion abnormalities. Left ventricular diastolic parameters are  indeterminate. Elevated left atrial  pressure.   2. The mitral valve is abnormal. Moderate mitral valve regurgitation.   3. Tricuspid valve regurgitation is moderate.   4. Right ventricular systolic function is normal. The right ventricular  size is normal. There is normal pulmonary artery systolic pressure.   5. Moderate pericardial effusion. The pericardial effusion is anterior to  the right ventricle and localized near the right atrium. No evidence of  increased pericardial pressure   6. The aortic valve is tricuspid. Aortic valve regurgitation is not  visualized.   Comparison(s): Prior images reviewed side by side. Compared to prior,  mitral regurgitation and tricuspid regurgitation have increased.  Pericardial effusion is similar but predominantly RV anterior in nature.      06/12/21: Coronary Ca++ IMPRESSION: 1. Coronary calcium score of 158. This was 78th percentile for age-, race-, and sex-matched controls. 2. Dilated main  pulmonary artery to 35 mm, suggestive of pulmonary hypertension. 3. Aortic atherosclerosis. 4. Aortic and mitral annular calcification.     Dec-Jan 2023, monitor 1. NSR/SB/ST 2. Freq PVCs and short runs of NSVT 3. Runs  of SVT 4. Needs ROV to discuss   In review of report PVC burden was 3%    Patient Profile     72 y.o. 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 Abnormal EKG No CP HS trop neg x2   Updated echo >> LVEF remains preserved,  no WMA Cath with no obstructive disease    3. DM Diet controlled No meds    4. Anxiety Home hyroxyzine   For questions or updates, please contact [facility] Please consult [email or web address] for contact info under        Signed, Sheilah Pigeon, PA-C  02/14/2023, 8:54 AM

## 2023-02-14 NOTE — Care Management Important Message (Signed)
Important Message  Patient Details  Name: Alexandra Berger MRN: [id number] Date of Birth: Oct 06, 1950   Medicare Important Message Given:  Yes     Renie Ora 02/14/2023, 8:06 AM

## 2023-02-15 ENCOUNTER — Encounter (HOSPITAL_COMMUNITY): Payer: Self-pay | Admitting: Cardiology

## 2023-02-15 ENCOUNTER — Inpatient Hospital Stay (HOSPITAL_COMMUNITY): Payer: Medicare HMO

## 2023-02-15 DIAGNOSIS — Z950 Presence of cardiac pacemaker: Secondary | ICD-10-CM | POA: Diagnosis not present

## 2023-02-15 DIAGNOSIS — Z87891 Personal history of nicotine dependence: Secondary | ICD-10-CM | POA: Diagnosis not present

## 2023-02-15 DIAGNOSIS — I442 Atrioventricular block, complete: Secondary | ICD-10-CM | POA: Diagnosis not present

## 2023-02-15 DIAGNOSIS — I2510 Atherosclerotic heart disease of native coronary artery without angina pectoris: Secondary | ICD-10-CM | POA: Diagnosis not present

## 2023-02-15 DIAGNOSIS — I495 Sick sinus syndrome: Secondary | ICD-10-CM | POA: Diagnosis not present

## 2023-02-15 DIAGNOSIS — E119 Type 2 diabetes mellitus without complications: Secondary | ICD-10-CM | POA: Diagnosis not present

## 2023-02-15 DIAGNOSIS — Z8249 Family history of ischemic heart disease and other diseases of the circulatory system: Secondary | ICD-10-CM | POA: Diagnosis not present

## 2023-02-15 DIAGNOSIS — F419 Anxiety disorder, unspecified: Secondary | ICD-10-CM | POA: Diagnosis not present

## 2023-02-15 DIAGNOSIS — F32A Depression, unspecified: Secondary | ICD-10-CM | POA: Diagnosis not present

## 2023-02-15 DIAGNOSIS — E785 Hyperlipidemia, unspecified: Secondary | ICD-10-CM | POA: Diagnosis not present

## 2023-02-15 DIAGNOSIS — I10 Essential (primary) hypertension: Secondary | ICD-10-CM | POA: Diagnosis not present

## 2023-02-15 DIAGNOSIS — I083 Combined rheumatic disorders of mitral, aortic and tricuspid valves: Secondary | ICD-10-CM | POA: Diagnosis not present

## 2023-02-15 DIAGNOSIS — I3139 Other pericardial effusion (noninflammatory): Secondary | ICD-10-CM | POA: Diagnosis not present

## 2023-02-15 MED FILL — Gentamicin Sulfate Inj 40 MG/ML: INTRAMUSCULAR | Qty: 80 | Status: AC

## 2023-02-15 NOTE — TOC Initial Note (Signed)
Transition of Care [facility]) - Initial/Assessment Note    Patient Details  Name: Alexandra Berger MRN: [id number] Date of Birth: 08-23-1950  Transition of Care [facility]) CM/SW Contact:    Leone Haven, RN Phone Number: 02/15/2023, 10:19 AM  Clinical Narrative:                 From home with grandson and son, has PCP and insurance on file, states has no HH services in place at this time , has a quad cane that she walks with.  States SIL will transport her home at [employer] and family is support system, states gets medications from [place].  Pta self ambulatory with quad cane.   Expected Discharge Plan: Home/Self Care Barriers to Discharge: No Barriers Identified   Patient Goals and CMS Choice Patient states their goals for this hospitalization and ongoing recovery are:: return home   Choice offered to / list presented to : NA      Expected Discharge Plan and Services In-house Referral: NA Discharge Planning Services: CM Consult Post Acute Care Choice: NA Living arrangements for the past 2 months: Single Family Home Expected Discharge Date: 02/15/23               DME Arranged: N/A DME Agency: NA       HH Arranged: NA          Prior Living Arrangements/Services Living arrangements for the past 2 months: Single Family Home Lives with:: Adult Children Patient language and need for interpreter reviewed:: Yes Do you feel safe going back to the place where you live?: Yes      Need for Family Participation in Patient Care: Yes (Comment) Care giver support system in place?: Yes (comment) Current home services: DME (quad cane) Criminal Activity/Legal Involvement Pertinent to Current Situation/Hospitalization: No - Comment as needed  Activities of Daily Living Home Assistive Devices/Equipment: Blood pressure cuff, Cane (specify quad or straight), CBG Meter, Dentures (specify type), Eyeglasses, Electric scooter, Grab bars around toilet, Grab bars in shower, Hand-held shower  hose, Long-handled sponge, Shower chair without back, Wheelchair, Environmental consultant (specify type) ADL Screening (condition at time of admission) Patient's cognitive ability adequate to safely complete daily activities?: Yes Is the patient deaf or have difficulty hearing?: No Does the patient have difficulty seeing, even when wearing glasses/contacts?: No Does the patient have difficulty concentrating, remembering, or making decisions?: Yes (forgetful) Patient able to express need for assistance with ADLs?: Yes Does the patient have difficulty dressing or bathing?: No Independently performs ADLs?: Yes (appropriate for developmental age) Does the patient have difficulty walking or climbing stairs?: No Weakness of Legs: Both Weakness of Arms/Hands: None  Permission Sought/Granted Permission sought to share information with : Case Manager Permission granted to share information with : Yes, Verbal Permission Granted              Emotional Assessment Appearance:: Appears stated age Attitude/Demeanor/Rapport: Engaged Affect (typically observed): Appropriate Orientation: : Oriented to Self, Oriented to Place, Oriented to  Time, Oriented to Situation Alcohol / Substance Use: Not Applicable Psych Involvement: No (comment)  Admission diagnosis:  Sinus node dysfunction (HCC) [I49.5] Patient Active Problem List   Diagnosis Date Noted   Sinus node dysfunction (HCC) 02/11/2023   Syncope 08/12/2022   S/P lobectomy of lung 09/28/2021   Elevated coronary artery calcium score 08/22/2021   Malignant neoplasm of lower lobe of left lung (HCC) 08/02/2021   Essential hypertension 05/23/2021   Hyperlipidemia 05/23/2021   Orthostatic hypotension 05/23/2021   Syncope  and collapse 05/23/2021   Family history of heart disease 05/23/2021   Type 2 diabetes mellitus with complication, with long-term current use of insulin (HCC) 01/02/2017   Family history of colon cancer 04/25/2016   Heme + stool 04/25/2016    PCP:  Moshe Cipro, NP Pharmacy:   [facility] [phone] - [address] [place] - [address] [place] [address] Phone: [phone] Fax: [phone]     Social Determinants of Health (SDOH) Social History: SDOH Screenings   Food Insecurity: No Food Insecurity (02/12/2023)  Housing: Low Risk  (02/12/2023)  Transportation Needs: No Transportation Needs (02/12/2023)  Utilities: Not At Risk (02/12/2023)  Tobacco Use: Medium Risk (02/11/2023)   SDOH Interventions:     Readmission Risk Interventions     No data to display

## 2023-02-15 NOTE — Progress Notes (Signed)
Care assumed from Raquel RN at 0230. Small amount of drainage on PPM bandage. Patient declines to wear sling citing interference with sleeping. Otherwise no change from prior assessment.

## 2023-02-15 NOTE — Discharge Summary (Signed)
ELECTROPHYSIOLOGY PROCEDURE DISCHARGE SUMMARY    Patient ID: Alexandra Berger,  MRN: [id number], DOB/AGE: 72/30/1952 72 y.o.  Admit date: 02/11/2023 Discharge date: 02/15/2023  Primary Care Physician: Moshe Cipro, NP  Primary Cardiologist: Dr. Allyson Sabal Electrophysiologist: Dr. Lalla Brothers  Primary Discharge Diagnosis:  Symptomatic bradycardia Sinus node dysfunction H/o syncope  Secondary Discharge Diagnosis:  HTN DM anxiety  Allergies  Allergen Reactions   Sulfa Antibiotics Anaphylaxis and Shortness Of Breath   Pravachol [Pravastatin] Other (See Comments)    Dizziness    Statins Other (See Comments)    Dizziness      Procedures This Admission:   [facility] 02/12/23   Prox LAD lesion is 20% stenosed.   2nd Diag lesion is 20% stenosed.   Ost RCA to Prox RCA lesion is 20% stenosed.   1.  Mild nonobstructive coronary artery disease. 2.  Left ventricular angiography was not performed.  EF was normal by echo. 3.  Normal left ventricular end-diastolic pressure.  2.  Implantation of a Abbott dual chamber PPM on 02/14/23 by Dr Lalla Brothers.   There were no immediate post procedure complications. CXR on 02/15/23 demonstrated no pneumothorax status post device implantation.   Brief HPI: Alexandra Berger is a 72 y.o. female was to EP for evaluation of syncope (which she had a hx of, longstanding orthostatic symptoms). Though with new symptoms/behavior and unclear etiology of her recent syncope, underwent loop implant to better evaluate and follow her symptoms.  Via her loop monitor she was found to have marked sinus pauses 5sec > 13 sec > 2.8 second consecutively she was at rest but not asleep. Felt to explain her syncope and advise to the hospital   Hospital Course:  The patient was admitted noted to have a change in her EKG worrisome of perhaps silent MI with new inf Q waves/T changes and recommended that she have [facility] prior to PPM. LHC noted no obstructive disease and  underwent implantation of a PPM with details as outlined in the procedure report.  She was monitored on telemetry throughout her stay with no bradycardia/arrhythmias.  Left chest was without hematoma or ecchymosis.  The device was interrogated and found to be functioning normally.  CXR was obtained and demonstrated no pneumothorax status post device implantation.  Wound care, arm mobility, and restrictions were reviewed with the patient.  The patient feels well, denies any CP/SOB, with minimal site discomfort.  She was examined by Dr. Lalla Brothers and considered stable for discharge to home.   She will continue her high dose statin and lipid management via her PMD   Physical Exam: Vitals:   02/15/23 0300 02/15/23 0530 02/15/23 0722 02/15/23 0854  BP:  (!) 99/59 (!) 101/55 114/67  Pulse:  66 65 89  Resp:  18 18   Temp:  97.7 F (36.5 C) 97.7 F (36.5 C)   TempSrc:  Oral Oral   SpO2:  98% 95% 97%  Weight: 81.1 kg     Height:        GEN- The patient is well appearing, alert and oriented x 3 today.   HEENT: normocephalic, atraumatic; sclera clear, conjunctiva pink; hearing intact; oropharynx clear; neck supple, no JVP Lungs- CTA b/l, normal work of breathing.  No wheezes, rales, rhonchi Heart- RRR no murmurs, rubs or gallops, PMI not laterally displaced GI- soft, non-tender, non-distended Extremities- no clubbing, cyanosis, or edema MS- no significant deformity or atrophy Skin- warm and dry, no rash or lesion,  left chest without hematoma/ecchymosis Psych- euthymic  mood, full affect Neuro- no gross deficits   Labs:   Lab Results  Component Value Date   WBC 4.1 02/14/2023   HGB 12.2 02/14/2023   HCT 35.5 (L) 02/14/2023   MCV 92.4 02/14/2023   PLT 145 (L) 02/14/2023    Recent Labs  Lab 02/14/23 0628  NA 138  K 3.4*  CL 102  CO2 27  BUN 13  CREATININE 0.90  CALCIUM 8.8*  PROT 5.1*  BILITOT 0.7  ALKPHOS 52  ALT 19  AST 16  GLUCOSE 141*    Discharge Medications:   Allergies as of 02/15/2023       Reactions   Sulfa Antibiotics Anaphylaxis, Shortness Of Breath   Pravachol [pravastatin] Other (See Comments)   Dizziness    Statins Other (See Comments)   Dizziness         Medication List     TAKE these medications    acetaminophen 325 MG tablet Commonly known as: TYLENOL Take 325 mg by mouth daily as needed for mild pain or moderate pain.   atorvastatin 80 MG tablet Commonly known as: LIPITOR TAKE 1 TABLET BY MOUTH EVERY DAY What changed: when to take this   benazepril 10 MG tablet Commonly known as: LOTENSIN TAKE 1 TABLET BY MOUTH EVERY DAY What changed: when to take this   hydrOXYzine 25 MG tablet Commonly known as: ATARAX Take 25 mg by mouth at bedtime.        Disposition:  Discharge Instructions     Diet - low sodium heart healthy   Complete by: As directed    Increase activity slowly   Complete by: As directed         Duration of Discharge Encounter: Greater than 30 minutes including physician time.  Norma Fredrickson, PA-C 02/15/2023 9:06 AM

## 2023-02-15 NOTE — Discharge Instructions (Addendum)
Please follow below instructions for you loop recorder removal site as well, as we discussed     After Your Pacemaker     ACTIVITY Do not lift your arm above shoulder height for 1 week after your procedure. After 7 days, you may progress as below.  You should remove your sling 24 hours after your procedure, unless otherwise instructed by your provider.     Friday February 22, 2023  Saturday February 23, 2023 Sunday February 24, 2023 Monday February 25, 2023   Do not lift, push, pull, or carry anything over 10 pounds with the affected arm until 6 weeks (Friday March 29, 2023 ) after your procedure.   You may drive AFTER your wound check, unless you have been told otherwise by your provider.   Ask your healthcare provider when you can go back to work   INCISION/Dressing  Monitor your Pacemaker site for redness, swelling, and drainage. Call the device clinic at [phone] if you experience these symptoms or fever/chills.  If your incision is sealed with Steri-strips or staples, you may shower 7 days after your procedure or when told by your provider. Do not remove the steri-strips or let the shower hit directly on your site. You may wash around your site with soap and water.    If you were discharged in a sling, please do not wear this during the day more than 48 hours after your surgery unless otherwise instructed. This may increase the risk of stiffness and soreness in your shoulder.   Avoid lotions, ointments, or perfumes over your incision until it is well-healed.  You may use a hot tub or a pool AFTER your wound check appointment if the incision is completely closed.  Pacemaker Alerts:  Some alerts are vibratory and others beep. These are NOT emergencies. Please call our office to let us know. If this occurs at night or on weekends, it can wait until the next business day. Send a remote transmission.  If your device is capable of reading fluid status (for heart failure), you  will be offered monthly monitoring to review this with you.   DEVICE MANAGEMENT Remote monitoring is used to monitor your pacemaker from home. This monitoring is scheduled every 91 days by our office. It allows [place] to keep an eye on the functioning of your device to ensure it is working properly. You will routinely see your Electrophysiologist annually (more often if necessary).   You should receive your ID card for your new device in 4-8 weeks. Keep this card with you at all times once received. Consider wearing a medical alert bracelet or necklace.  Your Pacemaker may be MRI compatible. This will be discussed at your next office visit/wound check.  You should avoid contact with strong electric or magnetic fields.   Do not use amateur (ham) radio equipment or electric (arc) welding torches. MP3 player headphones with magnets should not be used. Some devices are safe to use if held at least 12 inches (30 cm) from your Pacemaker. These include power tools, lawn mowers, and speakers. If you are unsure if something is safe to use, ask your health care provider.  When using your cell phone, hold it to the ear that is on the opposite side from the Pacemaker. Do not leave your cell phone in a pocket over the Pacemaker.  You may safely use electric blankets, heating pads, computers, and microwave ovens.  Call the office right away if: You have chest pain. You feel more  short of breath than you have felt before. You feel more light-headed than you have felt before. Your incision starts to open up.  This information is not intended to replace advice given to you by your health care provider. Make sure you discuss any questions you have with your health care provider.

## 2023-02-15 NOTE — TOC Transition Note (Signed)
Transition of Care [facility]) - CM/SW Discharge Note   Patient Details  Name: Alexandra Berger MRN: [id number] Date of Birth: [birth year]  Transition of Care [facility]) CM/SW Contact:  Leone Haven, RN Phone Number: 02/15/2023, 10:20 AM   Clinical Narrative:    For dc today, her SIL will transport her home. She has no needs.    Final next level of care: Home/Self Care Barriers to Discharge: No Barriers Identified   Patient Goals and CMS Choice   Choice offered to / list presented to : NA  Discharge Placement                         Discharge Plan and Services Additional resources added to the After Visit Summary for   In-house Referral: NA Discharge Planning Services: CM Consult Post Acute Care Choice: NA          DME Arranged: N/A DME Agency: NA       HH Arranged: NA          Social Determinants of Health (SDOH) Interventions SDOH Screenings   Food Insecurity: No Food Insecurity (02/12/2023)  Housing: Low Risk  (02/12/2023)  Transportation Needs: No Transportation Needs (02/12/2023)  Utilities: Not At Risk (02/12/2023)  Tobacco Use: Medium Risk (02/11/2023)     Readmission Risk Interventions     No data to display

## 2023-02-28 ENCOUNTER — Ambulatory Visit: Payer: Medicare HMO | Attending: Cardiovascular Disease

## 2023-02-28 DIAGNOSIS — I442 Atrioventricular block, complete: Secondary | ICD-10-CM

## 2023-02-28 LAB — CUP PACEART INCLINIC DEVICE CHECK
Battery Remaining Longevity: 183 mo
Battery Voltage: 3.23 V
Brady Statistic AP VP Percent: 0 %
Brady Statistic AP VS Percent: 3.18 %
Brady Statistic AS VP Percent: 0.01 %
Brady Statistic AS VS Percent: 96.81 %
Brady Statistic RA Percent Paced: 3.17 %
Brady Statistic RV Percent Paced: 0.01 %
Date Time Interrogation Session: 20240905200307
Implantable Lead Connection Status: 753985
Implantable Lead Connection Status: 753985
Implantable Lead Implant Date: 20240822
Implantable Lead Implant Date: 20240822
Implantable Lead Location: 753859
Implantable Lead Location: 753860
Implantable Lead Model: 3830
Implantable Lead Model: 5076
Implantable Pulse Generator Implant Date: 20240822
Lead Channel Impedance Value: 304 Ohm
Lead Channel Impedance Value: 342 Ohm
Lead Channel Impedance Value: 380 Ohm
Lead Channel Impedance Value: 494 Ohm
Lead Channel Pacing Threshold Amplitude: 0.5 V
Lead Channel Pacing Threshold Amplitude: 0.5 V
Lead Channel Pacing Threshold Amplitude: 0.75 V
Lead Channel Pacing Threshold Pulse Width: 0.4 ms
Lead Channel Pacing Threshold Pulse Width: 0.4 ms
Lead Channel Pacing Threshold Pulse Width: 0.4 ms
Lead Channel Sensing Intrinsic Amplitude: 1 mV
Lead Channel Sensing Intrinsic Amplitude: 1.25 mV
Lead Channel Sensing Intrinsic Amplitude: 13.25 mV
Lead Channel Sensing Intrinsic Amplitude: 20.25 mV
Lead Channel Setting Pacing Amplitude: 3.5 V
Lead Channel Setting Pacing Amplitude: 3.5 V
Lead Channel Setting Pacing Pulse Width: 0.4 ms
Lead Channel Setting Sensing Sensitivity: 0.9 mV
Zone Setting Status: 755011
Zone Setting Status: 755011

## 2023-02-28 NOTE — Progress Notes (Signed)
Wound check appointment. Steri-strips removed. Wound without redness or edema. Incision edges approximated, wound well healed. Normal device function. Thresholds, sensing, and impedances consistent with implant measurements. Device programmed at 3.5V/auto capture programmed on for extra safety margin until 3 month visit. Histogram distribution appropriate for patient and level of activity. No mode switches. 1 Fast A/V (7 secs),  2 device flagged NSVT's, all appear 1:1 atrial driven, duration 1 sec.  Patient educated about wound care, arm mobility, lifting restrictions. ROV in 3 months with implanting physician.

## 2023-02-28 NOTE — Patient Instructions (Signed)
   After Your Pacemaker   Monitor your pacemaker site for redness, swelling, and drainage. Call the device clinic at [phone] if you experience these symptoms or fever/chills.  Your incision was closed with Steri-strips or staples:  You may shower 7 days after your procedure and wash your incision with soap and water. Avoid lotions, ointments, or perfumes over your incision until it is well-healed.  You may use a hot tub or a pool after your wound check appointment if the incision is completely closed.  Do not lift, push or pull greater than 10 pounds with the affected arm until 6 weeks after your procedure. UNTIL AFTER OCTOBER 3RD. There are no other restrictions in arm movement after your wound check appointment.  You may drive, unless driving has been restricted by your healthcare providers.  Remote monitoring is used to monitor your pacemaker from home. This monitoring is scheduled every 91 days by our office. It allows [place] to keep an eye on the functioning of your device to ensure it is working properly. You will routinely see your Electrophysiologist annually (more often if necessary).

## 2023-03-04 DIAGNOSIS — Z09 Encounter for follow-up examination after completed treatment for conditions other than malignant neoplasm: Secondary | ICD-10-CM | POA: Diagnosis not present

## 2023-03-04 DIAGNOSIS — R001 Bradycardia, unspecified: Secondary | ICD-10-CM | POA: Diagnosis not present

## 2023-03-04 DIAGNOSIS — F0390 Unspecified dementia without behavioral disturbance: Secondary | ICD-10-CM | POA: Diagnosis not present

## 2023-03-04 DIAGNOSIS — Z6829 Body mass index (BMI) 29.0-29.9, adult: Secondary | ICD-10-CM | POA: Diagnosis not present

## 2023-03-04 DIAGNOSIS — D3A8 Other benign neuroendocrine tumors: Secondary | ICD-10-CM | POA: Diagnosis not present

## 2023-03-04 DIAGNOSIS — I700 Atherosclerosis of aorta: Secondary | ICD-10-CM | POA: Diagnosis not present

## 2023-03-06 ENCOUNTER — Telehealth: Payer: Self-pay

## 2023-03-06 NOTE — Telephone Encounter (Signed)
Patient now has a MDT Pacemaker and ILR (Bio) has been removed.  I have now deactivated her in the [employer] system.

## 2023-03-06 NOTE — Telephone Encounter (Signed)
No messages received for at least 21 days Last message received 22 days ago. The patient will be deactivated in 67 days.

## 2023-03-07 ENCOUNTER — Other Ambulatory Visit: Payer: Self-pay | Admitting: Cardiovascular Disease

## 2023-05-31 ENCOUNTER — Other Ambulatory Visit: Payer: Self-pay | Admitting: Cardiovascular Disease

## 2023-06-04 NOTE — Telephone Encounter (Signed)
Refill sent with appt needed noted

## 2023-06-11 ENCOUNTER — Encounter: Payer: Medicare HMO | Admitting: Cardiology

## 2023-06-14 ENCOUNTER — Ambulatory Visit: Payer: Medicare HMO | Attending: Student | Admitting: Student

## 2023-06-14 ENCOUNTER — Encounter: Payer: Self-pay | Admitting: Student

## 2023-06-14 VITALS — BP 132/74 | HR 102 | Ht 66.0 in | Wt 180.8 lb

## 2023-06-14 DIAGNOSIS — Z794 Long term (current) use of insulin: Secondary | ICD-10-CM | POA: Diagnosis not present

## 2023-06-14 DIAGNOSIS — I442 Atrioventricular block, complete: Secondary | ICD-10-CM | POA: Diagnosis not present

## 2023-06-14 DIAGNOSIS — E118 Type 2 diabetes mellitus with unspecified complications: Secondary | ICD-10-CM

## 2023-06-14 LAB — CUP PACEART INCLINIC DEVICE CHECK
Date Time Interrogation Session: 20241220111010
Implantable Lead Connection Status: 753985
Implantable Lead Connection Status: 753985
Implantable Lead Implant Date: 20240822
Implantable Lead Implant Date: 20240822
Implantable Lead Location: 753859
Implantable Lead Location: 753860
Implantable Lead Model: 3830
Implantable Lead Model: 5076
Implantable Pulse Generator Implant Date: 20240822

## 2023-06-14 NOTE — Patient Instructions (Signed)

## 2023-06-14 NOTE — Progress Notes (Signed)
  Electrophysiology Office Note:   ID:  Alexandra, Berger Sep 10, 1950, MRN [id number]  Primary Cardiologist: Nanetta Batty, MD Electrophysiologist: Lanier Prude, MD      History of Present Illness:   Alexandra Berger is a 72 y.o. female with h/o CHB s/p PPM seen today for routine electrophysiology followup.   Since last being seen in our clinic the patient reports doing well from a cardiac perspective. She does have orthostatic symptoms and has fallen back into bed at least, even since getting the PPM. Otherwise,  she denies chest pain, palpitations, dyspnea, PND, orthopnea, nausea, vomiting, edema, weight gain, or early satiety.   Review of systems complete and found to be negative unless listed in HPI.   EP Information / Studies Reviewed:    EKG is not ordered today. EKG from 02/15/2023 reviewed which showed NSR at 66 bpm       PPM Interrogation-  reviewed in detail today,  See PACEART report.  Device History: Medtronic Dual Chamber PPM implanted 01/2023 for CHB Loop recorder; Explanted at time of PPM  LHC 02/12/2023   Prox LAD lesion is 20% stenosed.   2nd Diag lesion is 20% stenosed.   Ost RCA to Prox RCA lesion is 20% stenosed.   1.  Mild nonobstructive coronary artery disease. 2.  Left ventricular angiography was not performed.  EF was normal by echo. 3.  Normal left ventricular end-diastolic pressure.  Echo 02/12/2023 LVEF 65-70%, normal RV, mild MR  Physical Exam:   VS:  BP 132/74   Pulse (!) 102   Ht 5\' 6"  (1.676 m)   Wt 180 lb 12.8 oz (82 kg)   SpO2 97%   BMI 29.18 kg/m    Wt Readings from Last 3 Encounters:  06/14/23 180 lb 12.8 oz (82 kg)  02/15/23 178 lb 11.2 oz (81.1 kg)  10/09/22 186 lb (84.4 kg)     GEN: Well nourished, well developed in no acute distress NECK: No JVD; No carotid bruits CARDIAC: Regular rate and rhythm, no murmurs, rubs, gallops RESPIRATORY:  Clear to auscultation without rales, wheezing or rhonchi   ABDOMEN: Soft, non-tender, non-distended EXTREMITIES:  No edema; No deformity   ASSESSMENT AND PLAN:    CHB s/p Medtronic PPM  H/o Syncope s/p ILR Normal PPM function See Pace Art report No changes today  DM2 Per PCP  Orthostatic symptoms Encouraged activity, hydration, and compression garments.  Disposition:   Follow up with Dr. Lalla Brothers in 12 months  Signed, Graciella Freer, PA-C

## 2023-06-29 ENCOUNTER — Other Ambulatory Visit: Payer: Self-pay | Admitting: Cardiovascular Disease

## 2024-06-17 ENCOUNTER — Other Ambulatory Visit: Payer: Self-pay | Admitting: Cardiovascular Disease

## 2024-07-08 ENCOUNTER — Encounter: Payer: Self-pay | Admitting: *Deleted

## 2024-07-08 NOTE — Progress Notes (Signed)
 Alexandra Berger                                          MRN: [id number]   07/08/2024   The [facility] Quality Team Specialist reviewed this patient medical record for the purposes of chart review for care gap closure. The following were reviewed: chart review for care gap closure-glycemic status assessment.    [facility] Quality Team

## 2024-07-18 ENCOUNTER — Other Ambulatory Visit: Payer: Self-pay | Admitting: Cardiovascular Disease

## 2024-07-21 ENCOUNTER — Encounter: Payer: Self-pay | Admitting: *Deleted

## 2024-07-21 NOTE — Progress Notes (Signed)
 Alexandra Berger                                          MRN: [id number]   07/21/2024   The [facility] Quality Team Specialist reviewed this patient medical record for the purposes of chart review for care gap closure. The following were reviewed: chart review for care gap closure-glycemic status assessment.    [facility] Quality Team

## 2024-07-24 NOTE — Telephone Encounter (Signed)
 In accordance with refill protocols, please review and address the following requirements before this medication refill can be authorized:  Labs  Pt needs labs within 365 days within normal range

## 2024-07-27 NOTE — Progress Notes (Signed)
 Alexandra Berger                                          MRN: [id number]   07/27/2024   The [facility] Quality Team Specialist reviewed this patient medical record for the purposes of chart review for care gap closure. The following were reviewed: chart review for care gap closure-kidney health evaluation for diabetes:eGFR  and uACR.    [facility] Quality Team

## 2024-07-30 NOTE — Progress Notes (Signed)
 Alexandra Berger                                          MRN: [id number]   07/30/2024   The [facility] Quality Team Specialist reviewed this patient medical record for the purposes of chart review for care gap closure. The following were reviewed: chart review for care gap closure-diabetic eye exam.    [facility] Quality Team

## 2024-09-17 ENCOUNTER — Ambulatory Visit: Admitting: Physician Assistant

## 2024-09-17 ENCOUNTER — Ambulatory Visit: Admitting: Student
